# Patient Record
Sex: Male | Born: 1970 | Race: White | Hispanic: No | Marital: Married | Smoking: Current every day smoker
Health system: Southern US, Community
[De-identification: ages and names within clinical notes are randomized; demographics above are authoritative.]

## PROBLEM LIST (undated history)

## (undated) DIAGNOSIS — IMO0002 Reserved for concepts with insufficient information to code with codable children: Secondary | ICD-10-CM

## (undated) DIAGNOSIS — F319 Bipolar disorder, unspecified: Secondary | ICD-10-CM

## (undated) DIAGNOSIS — I10 Essential (primary) hypertension: Secondary | ICD-10-CM

## (undated) DIAGNOSIS — F32A Depression, unspecified: Secondary | ICD-10-CM

## (undated) DIAGNOSIS — F329 Major depressive disorder, single episode, unspecified: Secondary | ICD-10-CM

## (undated) DIAGNOSIS — F419 Anxiety disorder, unspecified: Secondary | ICD-10-CM

## (undated) DIAGNOSIS — K219 Gastro-esophageal reflux disease without esophagitis: Secondary | ICD-10-CM

---
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
## 2012-05-15 ENCOUNTER — Emergency Department (HOSPITAL_COMMUNITY): Payer: Self-pay

## 2012-05-15 ENCOUNTER — Emergency Department (HOSPITAL_COMMUNITY)
Admission: EM | Admit: 2012-05-15 | Discharge: 2012-05-15 | Disposition: A | Discharge status: Home / Self Care | Payer: Self-pay | Attending: Emergency Medicine | Admitting: Emergency Medicine

## 2012-05-15 ENCOUNTER — Encounter (HOSPITAL_COMMUNITY): Payer: Self-pay | Admitting: Emergency Medicine

## 2012-05-15 DIAGNOSIS — F17200 Nicotine dependence, unspecified, uncomplicated: Secondary | ICD-10-CM | POA: Insufficient documentation

## 2012-05-15 DIAGNOSIS — R51 Headache: Secondary | ICD-10-CM | POA: Insufficient documentation

## 2012-05-15 DIAGNOSIS — Z87828 Personal history of other (healed) physical injury and trauma: Secondary | ICD-10-CM | POA: Insufficient documentation

## 2012-05-15 DIAGNOSIS — R6889 Other general symptoms and signs: Secondary | ICD-10-CM

## 2012-05-15 DIAGNOSIS — Z7982 Long term (current) use of aspirin: Secondary | ICD-10-CM | POA: Insufficient documentation

## 2012-05-15 DIAGNOSIS — Z79899 Other long term (current) drug therapy: Secondary | ICD-10-CM | POA: Insufficient documentation

## 2012-05-15 DIAGNOSIS — R05 Cough: Secondary | ICD-10-CM | POA: Insufficient documentation

## 2012-05-15 DIAGNOSIS — Z8739 Personal history of other diseases of the musculoskeletal system and connective tissue: Secondary | ICD-10-CM | POA: Insufficient documentation

## 2012-05-15 DIAGNOSIS — R079 Chest pain, unspecified: Secondary | ICD-10-CM | POA: Insufficient documentation

## 2012-05-15 DIAGNOSIS — R059 Cough, unspecified: Secondary | ICD-10-CM | POA: Insufficient documentation

## 2012-05-15 DIAGNOSIS — R52 Pain, unspecified: Secondary | ICD-10-CM | POA: Insufficient documentation

## 2012-05-15 DIAGNOSIS — R509 Fever, unspecified: Secondary | ICD-10-CM | POA: Insufficient documentation

## 2012-05-15 DIAGNOSIS — R062 Wheezing: Secondary | ICD-10-CM | POA: Insufficient documentation

## 2012-05-15 HISTORY — DX: Anxiety disorder, unspecified: F41.9

## 2012-05-15 MED ORDER — ACETAMINOPHEN 325 MG PO TABS
650.0000 mg | ORAL_TABLET | Freq: Once | ORAL | Status: AC
  Administered 2012-05-15: 650 mg via ORAL
  Filled 2012-05-15: qty 2

## 2012-05-15 MED ORDER — ALBUTEROL SULFATE HFA 108 (90 BASE) MCG/ACT IN AERS
2.0000 | INHALATION_SPRAY | RESPIRATORY_TRACT | Status: DC | PRN
  Administered 2012-05-15: 2 via RESPIRATORY_TRACT
  Filled 2012-05-15: qty 6.7

## 2012-05-15 MED ORDER — OSELTAMIVIR PHOSPHATE 75 MG PO CAPS: 75.0000 mg | ORAL_CAPSULE | Freq: Two times a day (BID) | ORAL | Status: DC

## 2012-05-15 MED ORDER — ALBUTEROL SULFATE HFA 108 (90 BASE) MCG/ACT IN AERS: 2.0000 | INHALATION_SPRAY | RESPIRATORY_TRACT | Status: DC | PRN

## 2012-05-15 MED ORDER — ALBUTEROL SULFATE (5 MG/ML) 0.5% IN NEBU
5.0000 mg | INHALATION_SOLUTION | Freq: Once | RESPIRATORY_TRACT | Status: AC
  Administered 2012-05-15: 5 mg via RESPIRATORY_TRACT
  Filled 2012-05-15: qty 1

## 2012-05-15 NOTE — ED Provider Notes (Signed)
History     CSN: [id number]  Arrival date & time 05/15/12  1540   First MD Initiated Contact with Patient 05/15/12 1654      Chief Complaint  Patient presents with  . Fever  . Cough  . Generalized Body Aches  . Chest Pain  . Headache    (Consider location/radiation/quality/duration/timing/severity/associated sxs/prior treatment) HPI Comments: This is a 41 year old male, who presents emergency department with chief complaint of cough, fever, and flulike symptoms. Patient states that he's been feeling sick for the past 5 days. He has not taken anything to alleviate his symptoms. There are no aggravating or alleviating factors. His symptoms are moderate. He endorses fever, cough, generalized body aches, chest tightness, and headache. He denies nausea, vomiting, diarrhea, constipation, numbness and tingling of the extremities.  The history is provided by the patient. No language interpreter was used.    Past Medical History  Diagnosis Date  . Anxiety     History reviewed. No pertinent past surgical history.  No family history on file.  History  Substance Use Topics  . Smoking status: Current Every Day Smoker  . Smokeless tobacco: Not on file  . Alcohol Use: No      Review of Systems  All other systems reviewed and are negative.    Allergies  Review of patient's allergies indicates no known allergies.  Home Medications   Current Outpatient Rx  Name  Route  Sig  Dispense  Refill  . GABAPENTIN 300 MG PO CAPS   Oral   Take 300 mg by mouth 3 (three) times daily.           BP 141/81  Pulse 82  Temp 100.7 F (38.2 C) (Oral)  Resp 18  SpO2 100%  Physical Exam  Nursing note and vitals reviewed. Constitutional: He is oriented to person, place, and time. He appears well-developed and well-nourished.  HENT:  Head: Normocephalic and atraumatic.  Nose: Nose normal.  Mouth/Throat: Oropharynx is clear and moist. No oropharyngeal exudate.  Eyes: Conjunctivae  normal and EOM are normal. Pupils are equal, round, and reactive to light. Right eye exhibits no discharge. Left eye exhibits no discharge. No scleral icterus.  Neck: Normal range of motion. Neck supple. No JVD present.  Cardiovascular: Normal rate, regular rhythm, normal heart sounds and intact distal pulses.  Exam reveals no gallop and no friction rub.   No murmur heard. Pulmonary/Chest: Effort normal. No respiratory distress. He has wheezes. He has no rales. He exhibits no tenderness.  Abdominal: Soft. Bowel sounds are normal. He exhibits no distension and no mass. There is no tenderness. There is no rebound and no guarding.  Musculoskeletal: Normal range of motion. He exhibits no edema and no tenderness.  Neurological: He is alert and oriented to person, place, and time.       CN 3-12 intact  Skin: Skin is warm and dry.  Psychiatric: He has a normal mood and affect. His behavior is normal. Judgment and thought content normal.    ED Course  Procedures (including critical care time)  No results found for this or any previous visit. Dg Chest 2 View  05/15/2012  *RADIOLOGY REPORT*  Clinical Data: Fever, cough, body aches  CHEST - 2 VIEW  Comparison: None.  Findings: Diffuse mild peribronchial cuffing and central airway thickening without evidence of focal airspace consolidation.  No pleural effusion or pneumothorax. Heart size is upper limits of normal.  No acute osseous abnormality.  IMPRESSION:  1.  Mild central airway  thickening/peribronchial cuffing as can be seen in both bronchitis and asthmatic conditions. 2.  While within normal limits, the cardiac size is at the upper limits of normal.   Original Report Authenticated By: Malachy Moan, M.D.       1. Flu-like symptoms       MDM  41 year old male with flu. Patient was wheezing on initial exam, therefore administered a nebulizer treatment, and the wheezing resolved. Patient states he feels a little but better. I'm going to  discharge the patient with an MDI and will try Tamiflu. Encouraged the patient to rest and take it easy, to hydrate frequently. Patient understands and agrees with the plan. He is stable and ready for discharge.  7:05 PM Patient requesting to be discharged. The fever has improved to 99.5.        Roxy Horseman, PA-C 05/15/12 1906

## 2012-05-15 NOTE — ED Notes (Signed)
Per ems: The patient has had 5 days of flu like symptoms. Possible fever.

## 2012-05-15 NOTE — ED Notes (Signed)
Patient states that for the last week he has had generalized body aches, rib pain and headaches. The patient states that he is also coughing. The patient has low grade temperature

## 2012-05-16 NOTE — ED Provider Notes (Signed)
Medical screening examination/treatment/procedure(s) were performed by non-physician practitioner and as supervising physician I was immediately available for consultation/collaboration.   Gerhard Munch, MD 05/16/12 [phone]

## 2013-11-20 ENCOUNTER — Encounter (HOSPITAL_COMMUNITY): Payer: Self-pay | Admitting: Emergency Medicine

## 2013-11-20 ENCOUNTER — Emergency Department (HOSPITAL_COMMUNITY)
Admission: EM | Admit: 2013-11-20 | Discharge: 2013-11-21 | Disposition: A | Discharge status: Home / Self Care | Payer: Self-pay | Attending: Emergency Medicine | Admitting: Emergency Medicine

## 2013-11-20 DIAGNOSIS — K429 Umbilical hernia without obstruction or gangrene: Secondary | ICD-10-CM | POA: Insufficient documentation

## 2013-11-20 DIAGNOSIS — R1084 Generalized abdominal pain: Secondary | ICD-10-CM | POA: Insufficient documentation

## 2013-11-20 DIAGNOSIS — F17200 Nicotine dependence, unspecified, uncomplicated: Secondary | ICD-10-CM | POA: Insufficient documentation

## 2013-11-20 DIAGNOSIS — R110 Nausea: Secondary | ICD-10-CM | POA: Insufficient documentation

## 2013-11-20 DIAGNOSIS — Z8659 Personal history of other mental and behavioral disorders: Secondary | ICD-10-CM | POA: Insufficient documentation

## 2013-11-20 DIAGNOSIS — Z872 Personal history of diseases of the skin and subcutaneous tissue: Secondary | ICD-10-CM | POA: Insufficient documentation

## 2013-11-20 DIAGNOSIS — Z791 Long term (current) use of non-steroidal anti-inflammatories (NSAID): Secondary | ICD-10-CM | POA: Insufficient documentation

## 2013-11-20 DIAGNOSIS — Z79899 Other long term (current) drug therapy: Secondary | ICD-10-CM | POA: Insufficient documentation

## 2013-11-20 HISTORY — DX: Reserved for concepts with insufficient information to code with codable children: IMO0002

## 2013-11-20 LAB — CBC WITH DIFFERENTIAL/PLATELET
Basophils Absolute: 0 10*3/uL (ref 0.0–0.1)
Basophils Relative: 0 % (ref 0–1)
EOS ABS: 0.2 10*3/uL (ref 0.0–0.7)
EOS PCT: 2 % (ref 0–5)
HEMATOCRIT: 45 % (ref 39.0–52.0)
Hemoglobin: 15.4 g/dL (ref 13.0–17.0)
LYMPHS ABS: 2.7 10*3/uL (ref 0.7–4.0)
Lymphocytes Relative: 23 % (ref 12–46)
MCH: 30.9 pg (ref 26.0–34.0)
MCHC: 34.2 g/dL (ref 30.0–36.0)
MCV: 90.4 fL (ref 78.0–100.0)
MONO ABS: 0.8 10*3/uL (ref 0.1–1.0)
Monocytes Relative: 7 % (ref 3–12)
Neutro Abs: 8.2 10*3/uL — ABNORMAL HIGH (ref 1.7–7.7)
Neutrophils Relative %: 68 % (ref 43–77)
PLATELETS: 233 10*3/uL (ref 150–400)
RBC: 4.98 MIL/uL (ref 4.22–5.81)
RDW: 14.9 % (ref 11.5–15.5)
WBC: 11.9 10*3/uL — ABNORMAL HIGH (ref 4.0–10.5)

## 2013-11-20 LAB — COMPREHENSIVE METABOLIC PANEL
ALBUMIN: 3.9 g/dL (ref 3.5–5.2)
ALT: 25 U/L (ref 0–53)
AST: 28 U/L (ref 0–37)
Alkaline Phosphatase: 62 U/L (ref 39–117)
Anion gap: 15 (ref 5–15)
BILIRUBIN TOTAL: 0.4 mg/dL (ref 0.3–1.2)
BUN: 10 mg/dL (ref 6–23)
CALCIUM: 9.1 mg/dL (ref 8.4–10.5)
CHLORIDE: 101 meq/L (ref 96–112)
CO2: 24 meq/L (ref 19–32)
Creatinine, Ser: 0.9 mg/dL (ref 0.50–1.35)
GFR calc Af Amer: >90 mL/min (ref >90)
Glucose, Bld: 108 mg/dL — ABNORMAL HIGH (ref 70–99)
Potassium: 4.1 mEq/L (ref 3.7–5.3)
SODIUM: 140 meq/L (ref 137–147)
Total Protein: 7.1 g/dL (ref 6.0–8.3)

## 2013-11-20 LAB — LIPASE, BLOOD: LIPASE: 25 U/L (ref 11–59)

## 2013-11-20 MED ORDER — FENTANYL CITRATE 0.05 MG/ML IJ SOLN
50.0000 ug | Freq: Once | INTRAMUSCULAR | Status: AC
Start: 2013-11-20 — End: 2013-11-21
  Administered 2013-11-21: 50 ug via INTRAVENOUS
  Filled 2013-11-20: qty 2

## 2013-11-20 MED ORDER — SODIUM CHLORIDE 0.9 % IV BOLUS (SEPSIS)
1000.0000 mL | Freq: Once | INTRAVENOUS | Status: AC
  Administered 2013-11-21: 1000 mL via INTRAVENOUS

## 2013-11-20 MED ORDER — ONDANSETRON HCL 4 MG/2ML IJ SOLN
4.0000 mg | Freq: Once | INTRAMUSCULAR | Status: AC
  Administered 2013-11-21: 4 mg via INTRAVENOUS
  Filled 2013-11-20: qty 2

## 2013-11-20 NOTE — ED Notes (Signed)
Presents with abdominal pain at the umbilicus began after eating. Pt states, "I felt a knot right at my belly button and it felt like the food was pushing up on something. It was a god awful pain" endorses feeling bloated and nauseated all day. denies SOB. Denies pain in chest. Palpation makes pain worse. Last bowel movement x3 today.

## 2013-11-20 NOTE — ED Notes (Signed)
Dr. Otter at the bedside.  

## 2013-11-21 ENCOUNTER — Emergency Department (HOSPITAL_COMMUNITY): Payer: Self-pay

## 2013-11-21 ENCOUNTER — Encounter (HOSPITAL_COMMUNITY): Payer: Self-pay | Admitting: Radiology

## 2013-11-21 LAB — URINALYSIS, ROUTINE W REFLEX MICROSCOPIC
BILIRUBIN URINE: NEGATIVE
GLUCOSE, UA: NEGATIVE mg/dL
HGB URINE DIPSTICK: NEGATIVE
KETONES UR: NEGATIVE mg/dL
NITRITE: NEGATIVE
PH: 7 (ref 5.0–8.0)
Protein, ur: NEGATIVE mg/dL
SPECIFIC GRAVITY, URINE: 1.01 (ref 1.005–1.030)
Urobilinogen, UA: 1 mg/dL (ref 0.0–1.0)

## 2013-11-21 LAB — URINE MICROSCOPIC-ADD ON

## 2013-11-21 MED ORDER — MORPHINE SULFATE 4 MG/ML IJ SOLN
4.0000 mg | Freq: Once | INTRAMUSCULAR | Status: AC
  Administered 2013-11-21: 4 mg via INTRAVENOUS
  Filled 2013-11-21: qty 1

## 2013-11-21 MED ORDER — DICYCLOMINE HCL 20 MG PO TABS: 20.0000 mg | ORAL_TABLET | Freq: Four times a day (QID) | ORAL | Status: DC | PRN

## 2013-11-21 MED ORDER — OMEPRAZOLE 20 MG PO CPDR: 40.0000 mg | DELAYED_RELEASE_CAPSULE | Freq: Every day | ORAL | Status: DC

## 2013-11-21 MED ORDER — ONDANSETRON 8 MG PO TBDP: 8.0000 mg | ORAL_TABLET | Freq: Three times a day (TID) | ORAL | Status: DC | PRN

## 2013-11-21 MED ORDER — IOHEXOL 300 MG/ML  SOLN
100.0000 mL | Freq: Once | INTRAMUSCULAR | Status: AC | PRN
Start: 2013-11-21 — End: 2013-11-21
  Administered 2013-11-21: 100 mL via INTRAVENOUS

## 2013-11-21 MED ORDER — DICYCLOMINE HCL 10 MG PO CAPS
20.0000 mg | ORAL_CAPSULE | Freq: Once | ORAL | Status: AC
  Administered 2013-11-21: 20 mg via ORAL
  Filled 2013-11-21: qty 2

## 2013-11-21 NOTE — ED Notes (Signed)
Called CT to report patient has had contrast.

## 2013-11-21 NOTE — ED Notes (Signed)
Reported to Dr. Sharol Given that patient reports medication didn't manage stomach pain. MD acknowledges, and orders morphine. She also allows him to drink at this time.

## 2013-11-21 NOTE — ED Notes (Signed)
This tech went in and pt had removed his IV himself, and states he is ready to be discharged.

## 2013-11-21 NOTE — ED Notes (Signed)
CT contrast brought at the bedside for patient to drink.

## 2013-11-21 NOTE — Discharge Instructions (Signed)
Abdominal Pain °Many things can cause abdominal pain. Usually, abdominal pain is not caused by a disease and will improve without treatment. It can often be observed and treated at home. Your health care provider will do a physical exam and possibly order blood tests and X-rays to help determine the seriousness of your pain. However, in many cases, more time must pass before a clear cause of the pain can be found. Before that point, your health care provider may not know if you need more testing or further treatment. °HOME CARE INSTRUCTIONS  °Monitor your abdominal pain for any changes. The following actions may help to alleviate any discomfort you are experiencing: °· Only take over-the-counter or prescription medicines as directed by your health care provider. °· Do not take laxatives unless directed to do so by your health care provider. °· Try a clear liquid diet (broth, tea, or water) as directed by your health care provider. Slowly move to a bland diet as tolerated. °SEEK MEDICAL CARE IF: °· You have unexplained abdominal pain. °· You have abdominal pain associated with nausea or diarrhea. °· You have pain when you urinate or have a bowel movement. °· You experience abdominal pain that wakes you in the night. °· You have abdominal pain that is worsened or improved by eating food. °· You have abdominal pain that is worsened with eating fatty foods. °· You have a fever. °SEEK IMMEDIATE MEDICAL CARE IF:  °· Your pain does not go away within 2 hours. °· You keep throwing up (vomiting). °· Your pain is felt only in portions of the abdomen, such as the right side or the left lower portion of the abdomen. °· You pass bloody or black tarry stools. °MAKE SURE YOU: °· Understand these instructions.   °· Will watch your condition.   °· Will get help right away if you are not doing well or get worse.   °Document Released: 02/10/2005 Document Revised: 05/08/2013 Document Reviewed: 01/10/2013 °[employer]® Patient Information  ©2015 [employer]. This information is not intended to replace advice given to you by your health care provider. Make sure you discuss any questions you have with your health care provider. ° °Nausea, Adult °Nausea is the feeling that you have an upset stomach or have to vomit. Nausea by itself is not likely a serious concern, but it may be an early sign of more serious medical problems. As nausea gets worse, it can lead to vomiting. If vomiting develops, there is the risk of dehydration.  °CAUSES  °· Viral infections. °· Food poisoning. °· Medicines. °· Pregnancy. °· Motion sickness. °· Migraine headaches. °· Emotional distress. °· Severe pain from any source. °· Alcohol intoxication. °HOME CARE INSTRUCTIONS °· Get plenty of rest. °· Ask your caregiver about specific rehydration instructions. °· Eat small amounts of food and sip liquids more often. °· Take all medicines as told by your caregiver. °SEEK MEDICAL CARE IF: °· You have not improved after 2 days, or you get worse. °· You have a headache. °SEEK IMMEDIATE MEDICAL CARE IF:  °· You have a fever. °· You faint. °· You keep vomiting or have blood in your vomit. °· You are extremely weak or dehydrated. °· You have dark or bloody stools. °· You have severe chest or abdominal pain. °MAKE SURE YOU: °· Understand these instructions. °· Will watch your condition. °· Will get help right away if you are not doing well or get worse. °Document Released: 06/10/2004 Document Revised: 01/26/2012 Document Reviewed: 01/13/2011 °[employer]® Patient Information ©2015   [employer]. This information is not intended to replace advice given to you by your health care provider. Make sure you discuss any questions you have with your health care provider. ° °

## 2013-11-21 NOTE — ED Provider Notes (Signed)
CSN: [id number]     Arrival date & time 11/20/13  1853 History   First MD Initiated Contact with Patient 11/20/13 2301     Chief Complaint  Patient presents with  . Abdominal Pain     (Consider location/radiation/quality/duration/timing/severity/associated sxs/prior Treatment) HPI 43 year old male presents to emergency department from home with complaint of diffuse abdominal pain x2 days.  He reports yesterday while eating he felt a knot at his bellybutton.  He had sharp diffuse pain soon afterwards.  Patient reports nausea but no vomiting.  He reports feeling dizzy and like he would pass out on the way here today.  No fevers or chills, no prior history of similar pain.  Patient reports he has history of bleeding ulcers, diagnosed with Hemoccult.  He reports he intermittently takes an unknown acid reducing medication.  He denies previous history of endoscopy or colonoscopy.  Patient reports bloating today. Past Medical History  Diagnosis Date  . Anxiety   . Ulcer    History reviewed. No pertinent past surgical history. History reviewed. No pertinent family history. History  Substance Use Topics  . Smoking status: Current Every Day Smoker    Types: Cigarettes  . Smokeless tobacco: Not on file  . Alcohol Use: No    Review of Systems   See History of Present Illness; otherwise all other systems are reviewed and negative  Allergies  Review of patient's allergies indicates no known allergies.  Home Medications   Prior to Admission medications   Medication Sig Start Date End Date Taking? Authorizing Provider  acetaminophen (TYLENOL) 325 MG tablet Take 325 mg by mouth every 6 (six) hours as needed.   Yes Historical Provider, MD  bismuth subsalicylate (PEPTO BISMOL) 262 MG/15ML suspension Take 30 mLs by mouth every 6 (six) hours as needed.   Yes Historical Provider, MD  ibuprofen (ADVIL,MOTRIN) 200 MG tablet Take 600 mg by mouth every 6 (six) hours as needed.   Yes Historical Provider,  MD  dicyclomine (BENTYL) 20 MG tablet Take 1 tablet (20 mg total) by mouth every 6 (six) hours as needed for spasms (for abdominal cramping). 11/21/13   Kalman Drape, MD  omeprazole (PRILOSEC) 20 MG capsule Take 2 capsules (40 mg total) by mouth daily. 11/21/13   Kalman Drape, MD  ondansetron (ZOFRAN ODT) 8 MG disintegrating tablet Take 1 tablet (8 mg total) by mouth every 8 (eight) hours as needed for nausea or vomiting. 11/21/13   Kalman Drape, MD   BP 109/63  Pulse 82  Temp(Src) 97.8 F (36.6 C) (Oral)  Resp 18  SpO2 98% Physical Exam  Nursing note and vitals reviewed. Constitutional: He is oriented to person, place, and time. He appears well-developed and well-nourished.  HENT:  Head: Normocephalic and atraumatic.  Right Ear: External ear normal.  Left Ear: External ear normal.  Nose: Nose normal.  Mouth/Throat: Oropharynx is clear and moist.  Eyes: Conjunctivae and EOM are normal. Pupils are equal, round, and reactive to light.  Neck: Normal range of motion. Neck supple. No JVD present. No tracheal deviation present. No thyromegaly present.  Cardiovascular: Normal rate, regular rhythm, normal heart sounds and intact distal pulses.  Exam reveals no gallop and no friction rub.   No murmur heard. Pulmonary/Chest: Effort normal and breath sounds normal. No stridor. No respiratory distress. He has no wheezes. He has no rales. He exhibits no tenderness.  Abdominal: Soft. He exhibits mass (small umbilical hernia). He exhibits no distension. There is tenderness (diffuse tenderness to  palpation). There is no rebound and no guarding.  Hyperactive bowel sounds  Musculoskeletal: Normal range of motion. He exhibits no edema and no tenderness.  Lymphadenopathy:    He has no cervical adenopathy.  Neurological: He is alert and oriented to person, place, and time. He has normal reflexes. No cranial nerve deficit. He exhibits normal muscle tone. Coordination normal.  Skin: Skin is warm and dry. No rash  noted. No erythema. No pallor.  Psychiatric: He has a normal mood and affect. His behavior is normal. Judgment and thought content normal.    ED Course  Procedures (including critical care time) Labs Review Labs Reviewed  COMPREHENSIVE METABOLIC PANEL - Abnormal; Notable for the following:    Glucose, Bld 108 (*)    All other components within normal limits  CBC WITH DIFFERENTIAL - Abnormal; Notable for the following:    WBC 11.9 (*)    Neutro Abs 8.2 (*)    All other components within normal limits  URINALYSIS, ROUTINE W REFLEX MICROSCOPIC - Abnormal; Notable for the following:    Leukocytes, UA TRACE (*)    All other components within normal limits  LIPASE, BLOOD  URINE MICROSCOPIC-ADD ON    Imaging Review Ct Abdomen Pelvis W Contrast  11/21/2013   CLINICAL DATA:  Abdominal pain.  Bloating.  Nausea.  EXAM: CT ABDOMEN AND PELVIS WITH CONTRAST  TECHNIQUE: Multidetector CT imaging of the abdomen and pelvis was performed using the standard protocol following bolus administration of intravenous contrast.  CONTRAST:  154mL OMNIPAQUE IOHEXOL 300 MG/ML  SOLN  COMPARISON:  None.  FINDINGS: The liver, gallbladder, pancreas, spleen, adrenal glands, and kidneys are normal in appearance. No evidence hydronephrosis. No soft tissue masses or lymphadenopathy identified within the abdomen or pelvis.  No evidence of inflammatory process or abnormal fluid collections. No evidence of bowel wall thickening or dilatation. Normal appendix is visualized.  IMPRESSION: Negative. No acute findings or other significant abnormality identified.   Electronically Signed   By: Earle Gell M.D.   On: 11/21/2013 01:45     EKG Interpretation   Date/Time:  Tuesday November 20 2013 19:28:01 EDT Ventricular Rate:  89 PR Interval:  148 QRS Duration: 96 QT Interval:  384 QTC Calculation: 467 R Axis:   66 Text Interpretation:  Normal sinus rhythm Normal ECG No old tracing to  compare Confirmed by Thang Flett  MD, Taffany Heiser ([id number]) on  11/20/2013 11:09:41 PM      MDM   Final diagnoses:  Generalized abdominal pain  Nausea    43 year old male with diffuse abdominal pain for 2 days.  He has a very slight elevation in his white blood cell count.  Patient is significantly tender on palpation.  No focality.  History of stomach ulcers, patient does not appear to have acute abdomen at this time, doubt perforated ulcer.  Plan for CT abdomen pelvis given the tenderness on exam.   CT scan unremarkable.  Patient has tolerated contrast without vomiting.  Plan to send home with Zofran Prilosec and Bentyl.    Kalman Drape, MD 11/21/13 [phone]

## 2013-12-19 DIAGNOSIS — R209 Unspecified disturbances of skin sensation: Secondary | ICD-10-CM | POA: Diagnosis present

## 2013-12-19 DIAGNOSIS — R7401 Elevation of levels of liver transaminase levels: Secondary | ICD-10-CM | POA: Diagnosis present

## 2013-12-19 DIAGNOSIS — R740 Nonspecific elevation of levels of transaminase and lactic acid dehydrogenase [LDH]: Secondary | ICD-10-CM

## 2013-12-19 DIAGNOSIS — I10 Essential (primary) hypertension: Secondary | ICD-10-CM | POA: Diagnosis present

## 2013-12-19 DIAGNOSIS — R799 Abnormal finding of blood chemistry, unspecified: Secondary | ICD-10-CM | POA: Diagnosis present

## 2013-12-19 DIAGNOSIS — R0789 Other chest pain: Secondary | ICD-10-CM | POA: Diagnosis present

## 2013-12-19 DIAGNOSIS — E8779 Other fluid overload: Secondary | ICD-10-CM | POA: Diagnosis not present

## 2013-12-19 DIAGNOSIS — J690 Pneumonitis due to inhalation of food and vomit: Secondary | ICD-10-CM | POA: Diagnosis present

## 2013-12-19 DIAGNOSIS — W19XXXA Unspecified fall, initial encounter: Secondary | ICD-10-CM | POA: Diagnosis present

## 2013-12-19 DIAGNOSIS — R21 Rash and other nonspecific skin eruption: Secondary | ICD-10-CM | POA: Diagnosis present

## 2013-12-19 DIAGNOSIS — Z79899 Other long term (current) drug therapy: Secondary | ICD-10-CM

## 2013-12-19 DIAGNOSIS — M6282 Rhabdomyolysis: Principal | ICD-10-CM | POA: Diagnosis present

## 2013-12-19 DIAGNOSIS — R000 Tachycardia, unspecified: Secondary | ICD-10-CM | POA: Diagnosis present

## 2013-12-19 DIAGNOSIS — F17200 Nicotine dependence, unspecified, uncomplicated: Secondary | ICD-10-CM | POA: Diagnosis present

## 2013-12-19 DIAGNOSIS — R52 Pain, unspecified: Secondary | ICD-10-CM | POA: Diagnosis present

## 2013-12-19 DIAGNOSIS — Z9181 History of falling: Secondary | ICD-10-CM

## 2013-12-19 DIAGNOSIS — R29898 Other symptoms and signs involving the musculoskeletal system: Secondary | ICD-10-CM | POA: Diagnosis present

## 2013-12-19 DIAGNOSIS — F1110 Opioid abuse, uncomplicated: Secondary | ICD-10-CM | POA: Diagnosis present

## 2013-12-19 DIAGNOSIS — F1410 Cocaine abuse, uncomplicated: Secondary | ICD-10-CM | POA: Diagnosis present

## 2013-12-19 DIAGNOSIS — M25569 Pain in unspecified knee: Secondary | ICD-10-CM | POA: Diagnosis present

## 2013-12-19 DIAGNOSIS — R7402 Elevation of levels of lactic acid dehydrogenase (LDH): Secondary | ICD-10-CM | POA: Diagnosis present

## 2013-12-20 ENCOUNTER — Inpatient Hospital Stay (HOSPITAL_COMMUNITY)
Admission: EM | Admit: 2013-12-20 | Discharge: 2013-12-25 | DRG: 557 | Disposition: A | Discharge status: Home / Self Care | Payer: Self-pay | Attending: Internal Medicine | Admitting: Internal Medicine

## 2013-12-20 ENCOUNTER — Emergency Department (HOSPITAL_COMMUNITY): Payer: MEDICAID

## 2013-12-20 ENCOUNTER — Emergency Department (HOSPITAL_COMMUNITY): Payer: Self-pay

## 2013-12-20 ENCOUNTER — Inpatient Hospital Stay (HOSPITAL_COMMUNITY): Payer: MEDICAID

## 2013-12-20 ENCOUNTER — Encounter (HOSPITAL_COMMUNITY): Payer: Self-pay | Admitting: Emergency Medicine

## 2013-12-20 ENCOUNTER — Inpatient Hospital Stay (HOSPITAL_COMMUNITY): Payer: Self-pay

## 2013-12-20 DIAGNOSIS — R52 Pain, unspecified: Secondary | ICD-10-CM | POA: Diagnosis present

## 2013-12-20 DIAGNOSIS — J189 Pneumonia, unspecified organism: Secondary | ICD-10-CM

## 2013-12-20 DIAGNOSIS — R29898 Other symptoms and signs involving the musculoskeletal system: Secondary | ICD-10-CM

## 2013-12-20 DIAGNOSIS — R202 Paresthesia of skin: Secondary | ICD-10-CM | POA: Diagnosis present

## 2013-12-20 DIAGNOSIS — F1910 Other psychoactive substance abuse, uncomplicated: Secondary | ICD-10-CM | POA: Diagnosis present

## 2013-12-20 DIAGNOSIS — R0789 Other chest pain: Secondary | ICD-10-CM

## 2013-12-20 DIAGNOSIS — M791 Myalgia, unspecified site: Secondary | ICD-10-CM

## 2013-12-20 DIAGNOSIS — R55 Syncope and collapse: Secondary | ICD-10-CM

## 2013-12-20 DIAGNOSIS — F1410 Cocaine abuse, uncomplicated: Secondary | ICD-10-CM

## 2013-12-20 DIAGNOSIS — T796XXA Traumatic ischemia of muscle, initial encounter: Secondary | ICD-10-CM

## 2013-12-20 DIAGNOSIS — R079 Chest pain, unspecified: Secondary | ICD-10-CM | POA: Diagnosis present

## 2013-12-20 DIAGNOSIS — R740 Nonspecific elevation of levels of transaminase and lactic acid dehydrogenase [LDH]: Secondary | ICD-10-CM

## 2013-12-20 DIAGNOSIS — F1110 Opioid abuse, uncomplicated: Secondary | ICD-10-CM

## 2013-12-20 DIAGNOSIS — R7401 Elevation of levels of liver transaminase levels: Secondary | ICD-10-CM | POA: Diagnosis present

## 2013-12-20 DIAGNOSIS — R778 Other specified abnormalities of plasma proteins: Secondary | ICD-10-CM | POA: Diagnosis present

## 2013-12-20 DIAGNOSIS — R7989 Other specified abnormal findings of blood chemistry: Secondary | ICD-10-CM | POA: Diagnosis present

## 2013-12-20 DIAGNOSIS — M6282 Rhabdomyolysis: Principal | ICD-10-CM | POA: Diagnosis present

## 2013-12-20 LAB — HEPARIN LEVEL (UNFRACTIONATED)
Heparin Unfractionated: <0.1 IU/mL — ABNORMAL LOW (ref 0.30–0.70)
Heparin Unfractionated: 0.19 IU/mL — ABNORMAL LOW (ref 0.30–0.70)

## 2013-12-20 LAB — COMPREHENSIVE METABOLIC PANEL
ALBUMIN: 3 g/dL — AB (ref 3.5–5.2)
ALK PHOS: 89 U/L (ref 39–117)
ALT: 144 U/L — ABNORMAL HIGH (ref 0–53)
ANION GAP: 16 — AB (ref 5–15)
AST: 215 U/L — ABNORMAL HIGH (ref 0–37)
BILIRUBIN TOTAL: 0.6 mg/dL (ref 0.3–1.2)
BUN: 21 mg/dL (ref 6–23)
CHLORIDE: 94 meq/L — AB (ref 96–112)
CO2: 23 mEq/L (ref 19–32)
CREATININE: 1.05 mg/dL (ref 0.50–1.35)
Calcium: 8.7 mg/dL (ref 8.4–10.5)
GFR calc non Af Amer: 87 mL/min — ABNORMAL LOW (ref >90)
GLUCOSE: 108 mg/dL — AB (ref 70–99)
POTASSIUM: 3.6 meq/L — AB (ref 3.7–5.3)
Sodium: 133 mEq/L — ABNORMAL LOW (ref 137–147)
TOTAL PROTEIN: 6.6 g/dL (ref 6.0–8.3)

## 2013-12-20 LAB — URINALYSIS, ROUTINE W REFLEX MICROSCOPIC
Bilirubin Urine: NEGATIVE
GLUCOSE, UA: 100 mg/dL — AB
KETONES UR: NEGATIVE mg/dL
Leukocytes, UA: NEGATIVE
Nitrite: NEGATIVE
PROTEIN: NEGATIVE mg/dL
Specific Gravity, Urine: 1.013 (ref 1.005–1.030)
Urobilinogen, UA: 1 mg/dL (ref 0.0–1.0)
pH: 6.5 (ref 5.0–8.0)

## 2013-12-20 LAB — RAPID URINE DRUG SCREEN, HOSP PERFORMED
AMPHETAMINES: NOT DETECTED
BENZODIAZEPINES: NOT DETECTED
Barbiturates: NOT DETECTED
COCAINE: POSITIVE — AB
OPIATES: POSITIVE — AB
Tetrahydrocannabinol: NOT DETECTED

## 2013-12-20 LAB — ACETAMINOPHEN LEVEL: Acetaminophen (Tylenol), Serum: <15 ug/mL (ref 10–30)

## 2013-12-20 LAB — I-STAT TROPONIN, ED: TROPONIN I, POC: 2.02 ng/mL — AB (ref 0.00–0.08)

## 2013-12-20 LAB — LIPASE, BLOOD: Lipase: 21 U/L (ref 11–59)

## 2013-12-20 LAB — CBC WITH DIFFERENTIAL/PLATELET
BASOS ABS: 0 10*3/uL (ref 0.0–0.1)
Basophils Relative: 0 % (ref 0–1)
Eosinophils Absolute: 0 10*3/uL (ref 0.0–0.7)
Eosinophils Relative: 0 % (ref 0–5)
HEMATOCRIT: 43.7 % (ref 39.0–52.0)
Hemoglobin: 15.2 g/dL (ref 13.0–17.0)
LYMPHS ABS: 1.2 10*3/uL (ref 0.7–4.0)
Lymphocytes Relative: 8 % — ABNORMAL LOW (ref 12–46)
MCH: 31.6 pg (ref 26.0–34.0)
MCHC: 34.8 g/dL (ref 30.0–36.0)
MCV: 90.9 fL (ref 78.0–100.0)
MONO ABS: 1.5 10*3/uL — AB (ref 0.1–1.0)
MONOS PCT: 10 % (ref 3–12)
NEUTROS ABS: 12 10*3/uL — AB (ref 1.7–7.7)
Neutrophils Relative %: 82 % — ABNORMAL HIGH (ref 43–77)
PLATELETS: ADEQUATE 10*3/uL (ref 150–400)
RBC: 4.81 MIL/uL (ref 4.22–5.81)
RDW: 15.9 % — AB (ref 11.5–15.5)
Smear Review: ADEQUATE
WBC: 14.7 10*3/uL — ABNORMAL HIGH (ref 4.0–10.5)

## 2013-12-20 LAB — PROTIME-INR
INR: 1.24 (ref 0.00–1.49)
Prothrombin Time: 15.6 seconds — ABNORMAL HIGH (ref 11.6–15.2)

## 2013-12-20 LAB — CK: CK TOTAL: 7082 U/L — AB (ref 7–232)

## 2013-12-20 LAB — TROPONIN I
TROPONIN I: 2.91 ng/mL — AB (ref <0.30)
TROPONIN I: 1.43 ng/mL — AB (ref <0.30)
Troponin I: 1.53 ng/mL (ref <0.30)

## 2013-12-20 LAB — URINE MICROSCOPIC-ADD ON

## 2013-12-20 LAB — OSMOLALITY: Osmolality: 286 mOsm/kg (ref 275–300)

## 2013-12-20 LAB — MRSA PCR SCREENING: MRSA by PCR: NEGATIVE

## 2013-12-20 LAB — ETHANOL

## 2013-12-20 MED ORDER — HEPARIN (PORCINE) IN NACL 100-0.45 UNIT/ML-% IJ SOLN
1500.0000 [IU]/h | INTRAMUSCULAR | Status: DC
  Administered 2013-12-20: 1500 [IU]/h via INTRAVENOUS
  Filled 2013-12-20: qty 250

## 2013-12-20 MED ORDER — SODIUM CHLORIDE 0.9 % IJ SOLN
3.0000 mL | Freq: Two times a day (BID) | INTRAMUSCULAR | Status: DC
  Administered 2013-12-20 – 2013-12-25 (×6): 3 mL via INTRAVENOUS

## 2013-12-20 MED ORDER — SODIUM CHLORIDE 0.9 % IV BOLUS (SEPSIS)
1000.0000 mL | Freq: Once | INTRAVENOUS | Status: AC
  Administered 2013-12-20: 1000 mL via INTRAVENOUS

## 2013-12-20 MED ORDER — IPRATROPIUM BROMIDE 0.02 % IN SOLN
0.5000 mg | Freq: Four times a day (QID) | RESPIRATORY_TRACT | Status: DC
Start: 2013-12-20 — End: 2013-12-20
  Administered 2013-12-20: 0.5 mg via RESPIRATORY_TRACT
  Filled 2013-12-20: qty 2.5

## 2013-12-20 MED ORDER — NICOTINE 21 MG/24HR TD PT24
21.0000 mg | MEDICATED_PATCH | Freq: Every day | TRANSDERMAL | Status: DC
  Administered 2013-12-20 – 2013-12-25 (×6): 21 mg via TRANSDERMAL
  Filled 2013-12-20 (×6): qty 1

## 2013-12-20 MED ORDER — HEPARIN BOLUS VIA INFUSION
4000.0000 [IU] | Freq: Once | INTRAVENOUS | Status: AC
  Administered 2013-12-20: 4000 [IU] via INTRAVENOUS
  Filled 2013-12-20: qty 4000

## 2013-12-20 MED ORDER — ALUM & MAG HYDROXIDE-SIMETH 200-200-20 MG/5ML PO SUSP
30.0000 mL | Freq: Three times a day (TID) | ORAL | Status: DC | PRN
  Administered 2013-12-20 – 2013-12-21 (×2): 30 mL via ORAL
  Filled 2013-12-20 (×2): qty 30

## 2013-12-20 MED ORDER — HEPARIN BOLUS VIA INFUSION
2500.0000 [IU] | Freq: Once | INTRAVENOUS | Status: AC
  Administered 2013-12-20: 2500 [IU] via INTRAVENOUS
  Filled 2013-12-20: qty 2500

## 2013-12-20 MED ORDER — HEPARIN SODIUM (PORCINE) 5000 UNIT/ML IJ SOLN
5000.0000 [IU] | Freq: Three times a day (TID) | INTRAMUSCULAR | Status: DC
  Filled 2013-12-20 (×4): qty 1

## 2013-12-20 MED ORDER — HEPARIN (PORCINE) IN NACL 100-0.45 UNIT/ML-% IJ SOLN
1200.0000 [IU]/h | INTRAMUSCULAR | Status: DC
  Administered 2013-12-20: 1200 [IU]/h via INTRAVENOUS
  Filled 2013-12-20: qty 250

## 2013-12-20 MED ORDER — SODIUM CHLORIDE 0.9 % IV SOLN
INTRAVENOUS | Status: DC
  Administered 2013-12-20 – 2013-12-21 (×4): via INTRAVENOUS

## 2013-12-20 MED ORDER — IPRATROPIUM BROMIDE 0.02 % IN SOLN
0.5000 mg | Freq: Once | RESPIRATORY_TRACT | Status: AC
  Administered 2013-12-20: 0.5 mg via RESPIRATORY_TRACT
  Filled 2013-12-20: qty 2.5

## 2013-12-20 MED ORDER — AZITHROMYCIN 500 MG IV SOLR
500.0000 mg | Freq: Once | INTRAVENOUS | Status: AC
  Administered 2013-12-20: 500 mg via INTRAVENOUS
  Filled 2013-12-20: qty 500

## 2013-12-20 MED ORDER — METHYLPREDNISOLONE SODIUM SUCC 40 MG IJ SOLR
40.0000 mg | Freq: Two times a day (BID) | INTRAMUSCULAR | Status: DC
  Administered 2013-12-20 – 2013-12-21 (×3): 40 mg via INTRAVENOUS
  Filled 2013-12-20 (×3): qty 1

## 2013-12-20 MED ORDER — DEXTROSE 5 % IV SOLN
1.0000 g | Freq: Once | INTRAVENOUS | Status: AC
  Administered 2013-12-20: 1 g via INTRAVENOUS
  Filled 2013-12-20: qty 10

## 2013-12-20 MED ORDER — ONDANSETRON HCL 4 MG/2ML IJ SOLN
4.0000 mg | Freq: Four times a day (QID) | INTRAMUSCULAR | Status: DC | PRN
  Administered 2013-12-20: 4 mg via INTRAVENOUS
  Filled 2013-12-20: qty 2

## 2013-12-20 MED ORDER — ASPIRIN EC 325 MG PO TBEC
325.0000 mg | DELAYED_RELEASE_TABLET | Freq: Every day | ORAL | Status: DC
  Administered 2013-12-20 – 2013-12-22 (×3): 325 mg via ORAL
  Filled 2013-12-20 (×4): qty 1

## 2013-12-20 MED ORDER — CLINDAMYCIN PHOSPHATE 900 MG/50ML IV SOLN
900.0000 mg | Freq: Once | INTRAVENOUS | Status: AC
  Administered 2013-12-20: 900 mg via INTRAVENOUS
  Filled 2013-12-20: qty 50

## 2013-12-20 MED ORDER — SODIUM CHLORIDE 0.9 % IV SOLN
3.0000 g | Freq: Four times a day (QID) | INTRAVENOUS | Status: DC
  Administered 2013-12-20 – 2013-12-25 (×19): 3 g via INTRAVENOUS
  Filled 2013-12-20 (×24): qty 3

## 2013-12-20 MED ORDER — HEPARIN (PORCINE) IN NACL 100-0.45 UNIT/ML-% IJ SOLN
1750.0000 [IU]/h | INTRAMUSCULAR | Status: DC
  Administered 2013-12-20: 1750 [IU]/h via INTRAVENOUS
  Filled 2013-12-20 (×2): qty 250

## 2013-12-20 MED ORDER — ALBUTEROL SULFATE (2.5 MG/3ML) 0.083% IN NEBU
2.5000 mg | INHALATION_SOLUTION | RESPIRATORY_TRACT | Status: DC | PRN
  Administered 2013-12-22: 2.5 mg via RESPIRATORY_TRACT
  Filled 2013-12-20: qty 3

## 2013-12-20 MED ORDER — IPRATROPIUM-ALBUTEROL 0.5-2.5 (3) MG/3ML IN SOLN
3.0000 mL | Freq: Three times a day (TID) | RESPIRATORY_TRACT | Status: DC
  Administered 2013-12-21 (×2): 3 mL via RESPIRATORY_TRACT
  Filled 2013-12-20 (×4): qty 3

## 2013-12-20 MED ORDER — PANTOPRAZOLE SODIUM 40 MG PO TBEC
40.0000 mg | DELAYED_RELEASE_TABLET | Freq: Every day | ORAL | Status: DC
  Administered 2013-12-20 – 2013-12-25 (×5): 40 mg via ORAL
  Filled 2013-12-20 (×7): qty 1

## 2013-12-20 MED ORDER — IPRATROPIUM-ALBUTEROL 0.5-2.5 (3) MG/3ML IN SOLN
3.0000 mL | Freq: Four times a day (QID) | RESPIRATORY_TRACT | Status: DC
  Administered 2013-12-20: 3 mL via RESPIRATORY_TRACT
  Filled 2013-12-20: qty 3

## 2013-12-20 MED ORDER — ALBUTEROL SULFATE (2.5 MG/3ML) 0.083% IN NEBU
2.5000 mg | INHALATION_SOLUTION | Freq: Four times a day (QID) | RESPIRATORY_TRACT | Status: DC
  Administered 2013-12-20: 2.5 mg via RESPIRATORY_TRACT
  Filled 2013-12-20: qty 3

## 2013-12-20 MED ORDER — OXYCODONE HCL 5 MG PO TABS
5.0000 mg | ORAL_TABLET | ORAL | Status: DC | PRN
  Administered 2013-12-20 – 2013-12-25 (×18): 5 mg via ORAL
  Filled 2013-12-20 (×19): qty 1

## 2013-12-20 MED ORDER — ONDANSETRON HCL 4 MG PO TABS: 4.0000 mg | ORAL_TABLET | Freq: Four times a day (QID) | ORAL | Status: DC | PRN

## 2013-12-20 MED ORDER — ALBUTEROL SULFATE (2.5 MG/3ML) 0.083% IN NEBU
5.0000 mg | INHALATION_SOLUTION | Freq: Once | RESPIRATORY_TRACT | Status: AC
  Administered 2013-12-20: 5 mg via RESPIRATORY_TRACT
  Filled 2013-12-20: qty 6

## 2013-12-20 NOTE — H&P (Signed)
[facility] History and Physical  Patient: Ralph Chavez  CWC:[id number]  DOB: 1970-09-17  DOS: the patient was seen and examined on 12/20/2013 PCP: No PCP Per Patient  Chief Complaint: Generalized body aches  HPI: Ralph Chavez is a 43 y.o. male with Past medical history of polysubstance abuse. The patient presented with complaints of a fall and generalized body pain. He is significantly poor historian and is not providing much information on history and talking with verbal abuse. He mentions that he used heroin for the first time on Monday and may have a fall. He does not remember how the fall happened he does not remember any information related to fall. Tuesday sometime he was found by a friend lying in his bed unresponsive. He has weakness in both legs throughout the Tuesday and Wednesday with numbness and tingling along with weakness. He also has some numbness on his left hand at the ulnar border. He could not walk or place any weight on his legs since Tuesday and has been lying down in the bed. Due to worsening pain and weakness EMS was called and the patient was brought here. He denies any other drug abuse but on questioning about also do cocaine in his urine he mentions he has used it once long time ago. He denies abusing any other opioids. He denies any prior use of opioids. He mentions he used heroine via snorting. He denies any dizziness or lightheadedness. He denies any headache. He feels more no fever no chills. He complains of some pain on his chest more on the right side. Worsening with deep breathing. He complains of pain in his abdomen, on his right side of the chest, bilateral flank, which is sharp pain all over. He also complains of pain in bilateral thigh, worsening with flexion, bilateral legs, and foot. He never had any rash before.  The patient is coming from home. And at his baseline independent for most of his ADL.  Review of Systems: as mentioned in the history  of present illness.  A Comprehensive review of the other systems is negative.  Past Medical History  Diagnosis Date  . Anxiety   . Ulcer    No past surgical history on file. Social History:  reports that he has been smoking Cigarettes.  He has been smoking about 0.00 packs per day. He has never used smokeless tobacco. He reports that he drinks alcohol. He reports that he uses illicit drugs (Cocaine).  No Known Allergies  No family history on file.  Prior to Admission medications   Medication Sig Start Date End Date Taking? Authorizing Provider  omeprazole (PRILOSEC) 20 MG capsule Take 20 mg by mouth daily.   Yes Historical Provider, MD    Physical Exam: Filed Vitals:   12/20/13 0001 12/20/13 0133 12/20/13 0137 12/20/13 0240  BP: 165/92 163/87  164/107  Pulse: 96 94  96  Temp: 98.9 F (37.2 C)  98.6 F (37 C)   TempSrc: Oral  Oral   Resp: 18 25  31   Height: 5\' 10"  (1.778 m)     Weight: 86.183 kg (190 lb)     SpO2: 98% 96%  99%    General: Alert, Awake and Oriented to Time, Place and Person. Appear in mild distress, verbally abusive Eyes: PERRL ENT: Oral Mucosa clear moist. Neck: no JVD Cardiovascular: S1 and S2 Present, no Murmur, Peripheral Pulses Present Respiratory: Bilateral Air entry equal and Decreased, right-sided Crackles, bilateral expiratory wheezes Abdomen: Bowel Sound Present, Soft and Non  tender on exam Skin: Bilateral thigh has diffuse lacelike Rash Extremities: No Pedal edema, generalized leg tenderness Neurologic: Mental status alert awake and oriented anxious speech normal, Cranial Nerves pupils are reactive cough reflex present, Motor strength bilaterally equal strength with 3 x 5 strength in upper extremity likely due to poor effort and 4 x 5 in lower extremity, Sensation bilaterally equal sensation, reflexes present on left difficult to assess on right, babinski equivocal, Cerebellar test normal.  Labs on Admission:  CBC:  Recent Labs Lab  12/20/13 0119  WBC 14.7*  NEUTROABS 12.0*  HGB 15.2  HCT 43.7  MCV 90.9  PLT PLATELET CLUMPS NOTED ON SMEAR, COUNT APPEARS ADEQUATE    CMP     Component Value Date/Time   NA 133* 12/20/2013 0119   K 3.6* 12/20/2013 0119   CL 94* 12/20/2013 0119   CO2 23 12/20/2013 0119   GLUCOSE 108* 12/20/2013 0119   BUN 21 12/20/2013 0119   CREATININE 1.05 12/20/2013 0119   CALCIUM 8.7 12/20/2013 0119   PROT 6.6 12/20/2013 0119   ALBUMIN 3.0* 12/20/2013 0119   AST 215* 12/20/2013 0119   ALT 144* 12/20/2013 0119   ALKPHOS 89 12/20/2013 0119   BILITOT 0.6 12/20/2013 0119   GFRNONAA 87* 12/20/2013 0119   GFRAA >90 12/20/2013 0119     Recent Labs Lab 12/20/13 0119  LIPASE 21   No results found for this basename: AMMONIA,  in the last 168 hours   Recent Labs Lab 12/20/13 0119  CKTOTAL 7082*   BNP (last 3 results) No results found for this basename: PROBNP,  in the last 8760 hours  Radiological Exams on Admission: Ct Cervical Spine Wo Contrast  12/20/2013   CLINICAL DATA:  Fall with weakness and pain.  EXAM: CT HEAD WITHOUT CONTRAST  CT CERVICAL SPINE WITHOUT CONTRAST  TECHNIQUE: Multidetector CT imaging of the head and cervical spine was performed following the standard protocol without intravenous contrast. Multiplanar CT image reconstructions of the cervical spine were also generated.  COMPARISON:  None.  FINDINGS: CT HEAD FINDINGS  Skull and Sinuses:Inflammatory mucosal thickening in the paranasal sinuses. No sinus effusion. 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 There is diffuse edema of the scalp. No calvarial fracture.  Orbits: No acute abnormality.  Brain: No evidence of acute abnormality, such as acute infarction, hemorrhage, hydrocephalus, or mass lesion/mass effect.  CT CERVICAL SPINE FINDINGS  Negative for acute fracture or traumatic subluxation. No prevertebral edema. No gross cervical canal hematoma. Facet degeneration, most notable at C3-4 where there is left foraminal stenosis secondary to  spurring.  IMPRESSION: No evidence of intracranial abnormality or cervical spine injury.   Electronically Signed   By: Jorje Guild M.D.   On: 12/20/2013 02:28   Dg Knee Complete 4 Views Left  12/20/2013   CLINICAL DATA:  Fall knee pain  EXAM: LEFT KNEE - COMPLETE 4+ VIEW  COMPARISON:  None  FINDINGS: Negative for fracture. Normal alignment. Anterior soft tissue swelling. No joint effusion. Joint spaces are maintained.  IMPRESSION: Anterior soft tissue swelling. Negative for fracture or dislocation.   Electronically Signed   By: Franchot Gallo M.D.   On: 12/20/2013 02:03    Assessment/Plan Principal Problem:   Rhabdomyolysis Active Problems:   Paresthesia of both lower extremities with weakness   Body aches   Chest pain   Transaminitis   Polysubstance abuse   1. Rhabdomyolysis The patient is presently complains of bilateral pain, paresthesia, rash, generalized weakness. He is found to  have elevated CK and LFT with AST to ALT ratio nearly 2 is to 1 with leukocytosis and platelet clumps with chest x-ray showing right-sided pneumonia and no fracture on CT scan of spine no acute intracranial abnormality on CT head. He is CT lumbar spine shows iliopsoas swelling. He complains of numbness but his exam is positive for sensations on light touch bilaterally. He has bilateral pulses and soft muscle compartments. He has a lacelike rash on bilateral thigh but no other redness or swelling of the leg. With all this findings the patient will be admitted to the hospital for possible rhabdomyolysis secondary to traumatic immobilization. We will hydrate him with IV fluids aggressively. Monitor neurovascular checks on lower extremity to keep in mind for compartment syndrome. Patient will currently be admitted to step down unit for close monitoring due to his neuromuscular complaining. Admit up an MRI of his lumbar spine and thoracic spine for further workup including ruling out any traumatic cord injury  versus inflammation. Based on the MRI neurology will be consulted. Currently with confluent findings and soft compartments with pulses palpable bilaterally I would hold off on consulting surgery at present. Probable etiology of rhabdomyolysis also into heroin induced hepatic toxicity.  2. This transaminitis. Patient denies any alcohol abuse. We will check Tylenol and ethanol levels. Continue to monitor at present. If there is further worsening he may require right upper quadrant ultrasound.  3. Pneumonia Patient is found to have right-sided pneumonia likely aspiration pneumonia. Currently I will give her lidocaine patch as well as local pain. I would treat him with you labs as well as Solu-Medrol 40 mg due to his wheezing and history of smoking. Treating him with Unasyn.  4. polysubstance abuse. Patient complains of significant pain. He is tachycardic and hypertensive therefore I would treat him with low dose of oxycodone.  DVT Prophylaxis: subcutaneous Heparin Nutrition:  Regular diet with aspiration precaution  Code Status:  Full  Disposition: Admitted to inpatient in step-down unit.  Author: Berle Mull, MD [facility] Pager: [phone] 12/20/2013, 5:38 AM    If 7PM-7AM, please contact night-coverage [email or web address] Password [email or web address]  **Disclaimer: This note may have been dictated with voice recognition software. Similar sounding words can inadvertently be transcribed and this note may contain transcription errors which may not have been corrected upon publication of note.**

## 2013-12-20 NOTE — ED Notes (Signed)
Patient has weakness in both legs after doing heroin on Monday. Patient states that he started having numbness, tingling, and falling five hours after trying heroin for the first time. The weakness in the legs was before he fell. EMS tried to walk patient but he could not stand.

## 2013-12-20 NOTE — Progress Notes (Signed)
Patient seen and examined, and data base reviewed. Patient seen with significant other at bedside, uses profanity and foul language. Patient seen earlier today by my colleague Dr. Posey Pronto. Patient initially admitted to the hospital after a fall and rhabdomyolysis, has pain all over.  Has recent use of heroin and cocaine. His troponin is 2.9, cardiology consulted, avoid beta blockers in the light of recent cocaine use, patient is on heparin. It appears that he has right lower lobe pneumonia, likely from aspiration as he lost his consciousness.  Birdie Hopes Pager: [phone] 12/20/2013, 2:53 PM

## 2013-12-20 NOTE — ED Notes (Signed)
Patient was vomiting yesterday and has had only two pieces of bacon today.

## 2013-12-20 NOTE — ED Notes (Signed)
Patient has been requested to keep blood pressure cuff, oxygen, and other equipment. Patient doesn't want the door open. Explained to patient the importance of staff keeping a close eye on him. Patient still wanted the door closed. Charge nurse accompanied nurse to explain to patient. Hospitalist went in and spoke to patient after the nurse and explained everything.

## 2013-12-20 NOTE — Progress Notes (Signed)
ANTICOAGULATION CONSULT NOTE - Follow Up Consult  Pharmacy Consult for Heparin Indication: chest pain/ACS  No Known Allergies  Patient Measurements: Height: 5\' 10"  (177.8 cm) Weight: 190 lb (86.183 kg) IBW/kg (Calculated) : 73 Heparin Dosing Weight: 86 kg  Vital Signs: BP: 138/90 mmHg (08/06 1400) Pulse Rate: 85 (08/06 1400)  Labs:  Recent Labs  12/20/13 0119 12/20/13 0755 12/20/13 0828 12/20/13 1450  HGB 15.2  --   --   --   HCT 43.7  --   --   --   PLT PLATELET CLUMPS NOTED ON SMEAR, COUNT APPEARS ADEQUATE  --   --   --   LABPROT  --   --  15.6*  --   INR  --   --  1.24  --   HEPARINUNFRC  --   --   --  <0.10*  CREATININE 1.05  --   --   --   CKTOTAL 7082*  --   --   --   TROPONINI  --  2.91*  --  1.53*    Estimated Creatinine Clearance: 96.6 ml/min (by C-G formula based on Cr of 1.05).   Medications:  Infusions:  . sodium chloride 150 mL/hr at 12/20/13 0801  . heparin 1,200 Units/hr (12/20/13 0802)    Assessment: 61 yoM admitted 8/6 with generalized body aches and weakness. Known polysubstance abuse, UDS positive for opioids and cocaine.  Troponin elevated in setting of likely rhabdomyolysis (CK elevated).  Pharmacy consulted to dose IV heparin for possible ACS with complaints of abd, R-sided chest pain, and flank pain.    First Heparin Level < 0.1, subtherapeutic on heparin at 1200 units/hr.  RN reports heparin drip was off for ~ 30 minutes this morning, around 0800 d/t IV pulled out.  No other interruptions, infusion complications, or bleeding reported.  Goal of Therapy:  Heparin level 0.3-0.7 units/ml Monitor platelets by anticoagulation protocol: Yes   Plan:   Give heparin 2500 units bolus IV x 1  Increase to heparin IV infusion at 1500 units/hr (15 ml/hr)  Heparin level 6 hours after starting  Daily heparin level and CBC  Continue to monitor H&H and platelets  Gretta Arab PharmD, BCPS Pager [phone] 12/20/2013 3:52 PM

## 2013-12-20 NOTE — Progress Notes (Signed)
ANTIBIOTIC CONSULT NOTE - INITIAL  Pharmacy Consult for Unasyn Indication: pneumonia  No Known Allergies  Patient Measurements: Height: 5\' 10"  (177.8 cm) Weight: 190 lb (86.183 kg) IBW/kg (Calculated) : 73 Adjusted Body Weight:   Vital Signs: Temp: 98.6 F (37 C) (08/06 0137) Temp src: Oral (08/06 0137) BP: 162/84 mmHg (08/06 0605) Pulse Rate: 96 (08/06 0240) Intake/Output from previous day: 08/05 0701 - 08/06 0700 In: 1450 [IV Piggyback:1450] Out: -  Intake/Output from this shift: Total I/O In: 1450 [IV Piggyback:1450] Out: -   Labs:  Recent Labs  12/20/13 0119  WBC 14.7*  HGB 15.2  PLT PLATELET CLUMPS NOTED ON SMEAR, COUNT APPEARS ADEQUATE  CREATININE 1.05   Estimated Creatinine Clearance: 96.6 ml/min (by C-G formula based on Cr of 1.05). No results found for this basename: VANCOTROUGH, VANCOPEAK, VANCORANDOM, GENTTROUGH, GENTPEAK, GENTRANDOM, TOBRATROUGH, TOBRAPEAK, TOBRARND, AMIKACINPEAK, AMIKACINTROU, AMIKACIN,  in the last 72 hours   Microbiology: No results found for this or any previous visit (from the past 720 hour(s)).  Medical History: Past Medical History  Diagnosis Date  . Anxiety   . Ulcer     Medications:  Anti-infectives   Start     Dose/Rate Route Frequency Ordered Stop   12/20/13 0630  Ampicillin-Sulbactam (UNASYN) 3 g in sodium chloride 0.9 % 100 mL IVPB     3 g 100 mL/hr over 60 Minutes Intravenous 4 times per day 12/20/13 0622     12/20/13 0415  clindamycin (CLEOCIN) IVPB 900 mg     900 mg 100 mL/hr over 30 Minutes Intravenous  Once 12/20/13 0401 12/20/13 0455   12/20/13 0400  cefTRIAXone (ROCEPHIN) 1 g in dextrose 5 % 50 mL IVPB     1 g 100 mL/hr over 30 Minutes Intravenous  Once 12/20/13 0353 12/20/13 0455   12/20/13 0400  azithromycin (ZITHROMAX) 500 mg in dextrose 5 % 250 mL IVPB     500 mg 250 mL/hr over 60 Minutes Intravenous  Once 12/20/13 0353 12/20/13 0546     Assessment: Patient with Aspiration Pneumonia and unasyn  per pharmacy ordered.  Goal of Therapy:  Unasyn dosed based on patient weight and renal function   Plan:  Follow up culture results Unasyn 3gm iv q6hr  Tyler Deis, Shea Stakes [place] 12/20/2013,6:24 AM

## 2013-12-20 NOTE — ED Notes (Signed)
Patient went to MRI. Dr. Posey Pronto gave a verbal that patient can go to MRI without tele. Step down ICU is picking patient up at MRI (Ralph Chavez)

## 2013-12-20 NOTE — ED Provider Notes (Signed)
CSN: [id number]     Arrival date & time 12/19/13  2357 History   First MD Initiated Contact with Patient 12/20/13 0004     Chief Complaint  Patient presents with  . Weakness    Patient has weakness in both legs after doing heroin on Monday. Patient states that he started having numbness, tingling, and falling five hours after trying heroin for the first time. The weakness in the legs was before he fell.     (Consider location/radiation/quality/duration/timing/severity/associated sxs/prior Treatment) HPI Comments: Jmari Pelc is a 43 y.o. Male with a PMHx of anxiety and gastric ulcer presenting today with BLE numbness/weakness and "tightness" x2 days after trying heroin for the first time Monday. States 5 hrs after snorting and injecting $100 worth of heroin, he developed these symptoms, and subsequently fell but does not recall the details of his fall. Pain is constant, nonradiating, severe, worse with movement/standing, and with no known alleviating factors. Has not tried anything specifically for this. States he has not been able to walk since Monday, has been using a bedside urinal and has had a family member bring him food/water. States his mid-back in the thoracic area started hurting around the same time but is unsure if this was before or after the fall because he cannot recall the fall, he only knows he fell because he had a scrape on his L knee and a swollen area to his scalp. Endorses epigastric abd pain, intermittent SOB and cough with blackish sputum since Monday. Denies fevers, chills, HA, vision changes, URI symptoms, CP, DOE, diaphoresis, jaw pain, neck pain/stiffness, LE swelling, calf swelling/erythema, joint swelling, incontinence of urine/stool, melena, hematochezia, n/v/d/c, scrotal swelling or pain, penile discharge, recent immobilization or travel, or hx of DVT. Drinks 5 beers per day, denies any other illicit drug use including marijuana or cocaine, smokes 1.5 ppd.   Patient is  a 43 y.o. male presenting with weakness. The history is provided by the patient. No language interpreter was used.  Weakness This is a new problem. The current episode started in the past 7 days. The problem occurs constantly. The problem has been unchanged. Associated symptoms include abdominal pain (epigastric), coughing (chronic), joint swelling (L knee), myalgias (BLEs), numbness (BLEs) and weakness (BLEs). Pertinent negatives include no change in bowel habit, chest pain, chills, congestion, diaphoresis, fatigue, fever, headaches, nausea, neck pain, rash, sore throat, urinary symptoms, vertigo, visual change or vomiting. The symptoms are aggravated by walking and standing. He has tried nothing for the symptoms. The treatment provided no relief.    Past Medical History  Diagnosis Date  . Anxiety   . Ulcer    No past surgical history on file. No family history on file. History  Substance Use Topics  . Smoking status: Current Every Day Smoker    Types: Cigarettes  . Smokeless tobacco: Never Used  . Alcohol Use: Yes    Review of Systems  Constitutional: Negative for fever, chills, diaphoresis and fatigue.  HENT: Negative for congestion and sore throat.        +swelling to occiput  Eyes: Negative for visual disturbance.  Respiratory: Positive for cough (chronic) and shortness of breath (intermittent, not currently). Negative for chest tightness.   Cardiovascular: Negative for chest pain, palpitations and leg swelling.  Gastrointestinal: Positive for abdominal pain (epigastric). Negative for nausea, vomiting, diarrhea, constipation, blood in stool, anal bleeding, rectal pain and change in bowel habit.  Genitourinary: Negative for dysuria, urgency, hematuria, flank pain, discharge, penile swelling, scrotal swelling, difficulty  urinating, penile pain and testicular pain.  Musculoskeletal: Positive for back pain (mid thoracic), gait problem (unable to walk), joint swelling (L knee) and  myalgias (BLEs). Negative for neck pain and neck stiffness.  Skin: Positive for wound (abrasion L knee, contusion scalp). Negative for rash.  Neurological: Positive for weakness (BLEs) and numbness (BLEs). Negative for dizziness, vertigo, syncope, light-headedness and headaches.  Psychiatric/Behavioral: Negative for confusion.  10 Systems reviewed and are negative for acute change except as noted in the HPI.     Allergies  Review of patient's allergies indicates no known allergies.  Home Medications   Prior to Admission medications   Medication Sig Start Date End Date Taking? Authorizing Provider  omeprazole (PRILOSEC) 20 MG capsule Take 20 mg by mouth daily.   Yes Historical Provider, MD   BP 163/87  Pulse 94  Temp(Src) 98.6 F (37 C) (Oral)  Resp 25  Ht 5\' 10"  (1.778 m)  Wt 190 lb (86.183 kg)  BMI 27.26 kg/m2  SpO2 96% Physical Exam  Nursing note and vitals reviewed. Constitutional: He is oriented to person, place, and time. Vital signs are normal. He appears well-developed and well-nourished. No distress.  VSS, NAD  HENT:  Head: Head is with contusion. Head is without Battle's sign and without abrasion.    Nose: Nose normal.  Mouth/Throat: Uvula is midline, oropharynx is clear and moist and mucous membranes are normal.  Scalp contusion with swelling noted to parietal/occipital area with no abrasion or laceration. No battle's sign  Eyes: Conjunctivae and EOM are normal. Pupils are equal, round, and reactive to light. Right eye exhibits no discharge. Left eye exhibits no discharge.  Neck: Normal range of motion. Neck supple. No spinous process tenderness and no muscular tenderness present. No rigidity. Normal range of motion present.  FROM intact with no spinous process or paraspinous muscle TTP. No rigidity or meningeal signs.  Cardiovascular: Normal rate, regular rhythm, normal heart sounds and intact distal pulses.  Exam reveals no gallop and no friction rub.   No murmur  heard. Pulmonary/Chest: Effort normal. No respiratory distress. He has no decreased breath sounds. He has wheezes. He has no rhonchi. He has no rales. Chest wall is not dull to percussion. He exhibits no tenderness, no bony tenderness and no crepitus.  Diffuse expiratory wheezes throughout all lung fields, which pt states is due to his smoking. No decreased breath sounds or rhonchi/rales. Chest nonTTP without crepitus or deformity or subQ air  Abdominal: Soft. Normal appearance and bowel sounds are normal. He exhibits no distension. There is tenderness in the epigastric area. There is no rigidity, no rebound, no guarding, no CVA tenderness, no tenderness at McBurney's point and negative Murphy's sign.    +BS throughout, soft nondistended with TTP in epigastrum but no r/g/r, neg murphy's, neg mcburney's, neg CVA tenderness. No midline pulsatile mass although body habitus limits exam  Musculoskeletal:       Right knee: He exhibits decreased range of motion. No tenderness found.       Left knee: He exhibits deformity, erythema and abnormal patellar mobility. He exhibits normal range of motion, no swelling, no effusion, no ecchymosis, no LCL laxity, no bony tenderness and no MCL laxity. No tenderness found.       Cervical back: Normal.       Thoracic back: Normal.       Lumbar back: Normal.       Right upper leg: He exhibits tenderness.       Left upper  leg: He exhibits tenderness.       Right lower leg: He exhibits tenderness.       Left lower leg: He exhibits tenderness.  L knee with abrasion and mild erythema but no warmth to joint. Patella slightly high-riding with sunken area over patellar tendon, FROM intact but patella mobility limited and does not appear to follow typical linear movement. No effusion or varus/valgus laxity. Pt states it's nonTTP but states he can't feel his legs well enough to answer this question appropriately.  Sensation to touch diminished in BLEs, R>L, but able to feel  pinching. Pt able to dorsiflex/plantarflex with ease and strength 5/5 with these movements, but is unable to flex/extend R knee without assistance. Neg SLR bilaterally.  All spinal levels with no spinous process or paraspinous muscle TTP. No deformity or bony step offs. ROM unable to be fully assessed given that pt refuses to stand. DTRs unable to be assessed due to pt uncooperativeness with exam. Upper extremities with good sensation throughout, and strength 5/5.  Neurological: He is alert and oriented to person, place, and time. A sensory deficit is present. Gait abnormal.  Sensation and strength and DTRs as above. Refuses to walk.  Skin: Skin is warm and dry. Abrasion noted. No rash noted.     Deformity to L knee as above. Abrasion to scalp as above. No other abnormalities noted to skin over exposed surfaces, no pedal edema.   Psychiatric: His affect is angry. He is agitated and aggressive. Cognition and memory are impaired.  Very upset with exam, refusing to answer questions and initially very disrespectful. Does not recall all events.     ED Course  Procedures (including critical care time) Labs Review Labs Reviewed  URINE RAPID DRUG SCREEN (HOSP PERFORMED) - Abnormal; Notable for the following:    Opiates POSITIVE (*)    Cocaine POSITIVE (*)    All other components within normal limits  CBC WITH DIFFERENTIAL  COMPREHENSIVE METABOLIC PANEL  CK  LIPASE, BLOOD  URINALYSIS, ROUTINE W REFLEX MICROSCOPIC    Imaging Review No results found.   EKG Interpretation None      MDM   Final diagnoses:  None    Vague complaints of BLE weakness/numbness following heroin use on Monday. Exam revealing L knee deformity, possible patellar tendon rupture, will xray. Epigastric pain and vague midthoracic pain could represent pancreatitis, pt is alcoholic, therefore will obtain basic labs including cbc w/diff, cmp, lipase, CK, UDS, and U/A. DDx includes rhabdo, spinal cord injury, conversion  disorder or other psych issue, Gillian-Barre syndrome. Doubt DVT/dissection at this time. Doubt ALS at this time, although this does present with ascending paralysis. Discussed pt with Dr. Ashok Cordia who agrees to take over care. Please see his dictation for further documentation of his care.     [place], [place] 12/20/13 [phone]

## 2013-12-20 NOTE — ED Notes (Signed)
Patient was complaining of his back was hurting and he could not breathe. MD was called to bedside.

## 2013-12-20 NOTE — Progress Notes (Signed)
Pt came down to MRI for MR of the thoracic and lumbar spine. Pt completed most of the thoracic spine. Before completion pt refused any further imaging. Pt stated he would not continue. Pt being admitted. Thoracic spine was completed out for dictation with images that were able to be obtained. Unable to complete mri of the lumbar spine due to pt/s refusal.

## 2013-12-20 NOTE — Progress Notes (Signed)
CARE MANAGEMENT NOTE 12/20/2013  Patient:  Ralph Chavez, Ralph Chavez   Account Number:  [id number]  Date Initiated:  12/20/2013  Documentation initiated by:  DAVIS,RHONDA  Subjective/Objective Assessment:   pt with multiple health problems -rhabo/pna/polysubstance abuse/main complaint that time of admission is generalized weakness and pain/     Action/Plan:   home when stable   Anticipated DC Date:  12/23/2013   Anticipated DC Plan:  [place] referral  Clinical Social Worker  Development worker, community      DC Planning Services  NA      [facility] Choice  NA   Choice offered to / List presented to:  NA   DME arranged  NA      DME agency  NA     [place] arranged  NA      [place] agency  NA   Status of service:  In process, will continue to follow Medicare Important Message given?  NA - LOS <3 / Initial given by admissions (If response is "NO", the following Medicare IM given date fields will be blank) Date Medicare IM given:   Medicare IM given by:   Date Additional Medicare IM given:   Additional Medicare IM given by:    Discharge Disposition:    Per UR Regulation:  Reviewed for med. necessity/level of care/duration of stay  If discussed at [facility] of Stay Meetings, dates discussed:    Comments:  [id number] Velva Harman, RN, BSN, CCM:  [phone] Chart reviewed.  No discharge needs present at time of this review. Financial counselor to see due to no active insurance.  May need scw due to polysubstance abuse. Next review of chart due on [id number].

## 2013-12-20 NOTE — ED Notes (Signed)
Pt refused continuous blood pressure reading.

## 2013-12-20 NOTE — ED Provider Notes (Signed)
Medical screening examination/treatment/procedure(s) were conducted as a shared visit with non-physician practitioner(s) and myself.  I personally evaluated the patient during the encounter.  Pt c/o use of heroin 2 days ago, then fell.  Pt very difficult/poor historian with little/no specifics provided regarding the abuse, the fall, how long on ground, in what position, etc.  Pt also very poor/belligerent historian, esp as relates localizing his pain and symptoms.   On exam diffuse neck and back tenderness, no focal bony tenderness, no step off.   ctls cts read as showing no acute bony spine injury. Note made on ct t spine and cxr of right lung airspace disease, ?aspiration or edema as sequela to heroine abuse episode vs pneumonia.  Pt does note sob, non prod cough, r rhonchi and wheezing bil, elev wbc.  Will rx possible cap. Will add clinda in event asp pna. Albuterol and atrovent neb.  Ck elevated, small hgb/blood on urine dip, renal fxn normal - ivf.   Given leg numbness/weakness, consider MR in AM when available if symptoms persist. No focal extremity sts or tight compartments noted. Distal pulses palp bil.  Med service contacted for admission.      Mirna Mires, MD 12/20/13 [phone]

## 2013-12-20 NOTE — ED Notes (Signed)
Pt has a urinal at the bedside.

## 2013-12-20 NOTE — ED Notes (Signed)
Troponin given to Dr. Ashok Cordia.

## 2013-12-20 NOTE — ED Provider Notes (Signed)
Medical screening examination/treatment/procedure(s) were conducted as a shared visit with non-physician practitioner(s) and myself.  I personally evaluated the patient during the encounter.  See prior blank note.    Mirna Mires, MD 12/20/13 [phone]

## 2013-12-20 NOTE — ED Notes (Signed)
Family at bedside. 

## 2013-12-20 NOTE — ED Notes (Signed)
Patient transported to MRI 

## 2013-12-20 NOTE — Progress Notes (Signed)
ANTICOAGULATION CONSULT NOTE - Initial Consult  Pharmacy Consult for heparin Indication: chest pain/ACS  No Known Allergies  Patient Measurements: Height: 5\' 10"  (177.8 cm) Weight: 190 lb (86.183 kg) IBW/kg (Calculated) : 73 Heparin Dosing Weight: 86kg  Vital Signs: Temp: 98.6 F (37 C) (08/06 0137) Temp src: Oral (08/06 0137) BP: 155/92 mmHg (08/06 0628) Pulse Rate: 100 (08/06 0645)  Labs:  Recent Labs  12/20/13 0119  HGB 15.2  HCT 43.7  PLT PLATELET CLUMPS NOTED ON SMEAR, COUNT APPEARS ADEQUATE  CREATININE 1.05  CKTOTAL 7082*    Estimated Creatinine Clearance: 96.6 ml/min (by C-G formula based on Cr of 1.05).   Medical History: Past Medical History  Diagnosis Date  . Anxiety   . Ulcer     Assessment: 42 YOM presents with generalized body aches and weakness. Known polysubstance abuse, UDS positive for opioids and cocaine.  Troponin elevated in setting of likely rhabdomyolysis (CK elevated).  Orders to start heparin for possible ACS with complaints of abd, R-sided chest pain, and flank pain.    CBC: Hgb OK, plt clumped but count appears adequate per comment  Goal of Therapy:  Heparin level 0.3-0.7 units/ml Monitor platelets by anticoagulation protocol: Yes   Plan:   Heparin 4000 unit bolus then 1200 units/hr  Check 6h heparin level  D/C SQ heparin (no doses given)  Daily heparin level and CBC  Monitor for bleeding  Doreene Eland, PharmD, BCPS.   Pager: [phone]  12/20/2013,7:29 AM

## 2013-12-20 NOTE — Progress Notes (Signed)
Central tele reported patient having cardiac pauses; confirmed on tele monitor; pt is asymptomatic at this time; EKG showed no acute changes from prior EKG; pt did have three confirmed pauses at bedside while restarting his IV; heart rate as low as 65

## 2013-12-20 NOTE — Progress Notes (Signed)
CRITICAL VALUE ALERT  Critical value received:  Troponin 2.91  Date of notification:  12/20/13  Time of notification:  0840  Critical value read back:Yes.    Nurse who received alert:  [email or web address],rn  MD notified (1st page):  elmahi  Time of first page:  0840  MD notified (2nd page):  Time of second page:  Responding MD:  elmahi  Time MD responded:  [phone]

## 2013-12-20 NOTE — ED Notes (Signed)
While in the patients room trying to get vitals on the patient, The patient snatched the blood pressure cuff off

## 2013-12-20 NOTE — Consult Note (Signed)
CARDIOLOGY CONSULT NOTE  Patient ID: Ralph Chavez MRN: [id number] DOB/AGE: 1970-08-23 43 y.o.  Admit date: 12/20/2013 Primary Physician None Primary Cardiologist None Chief Complaint  Elevated troponin  HPI:  The patient presented after a fall.  This may have been related to his polysubstance abuse.  He was found unresponsive.  Upon presentation he has been treated for rhabdomyolysis.  He has a CXR consistent with pneumonia.  Total CK is 7082.  We are called because of a troponin of 2.02.  EKG shows no acute ST T wave changes.  He has no history of coronary disease.  He has had no prior cardiac work up.  He doesn't sound like he is overly active.  However, with walking he denies any cardiovascular complaints.  The patient denies any new symptoms such as chest discomfort, neck or arm discomfort. There has been no new shortness of breath, PND or orthopnea. There have been no reported palpitations, presyncope or syncope.   Past Medical History  Diagnosis Date  . Anxiety   . Ulcer     No past surgical history on file.  No Known Allergies Prescriptions prior to admission  Medication Sig Dispense Refill  . omeprazole (PRILOSEC) 20 MG capsule Take 20 mg by mouth daily.       No family history on file.  History   Social History  . Marital Status: Single    Spouse Name: N/A    Number of Children: N/A  . Years of Education: N/A   Occupational History  . Not on file.   Social History Main Topics  . Smoking status: Current Every Day Smoker    Types: Cigarettes  . Smokeless tobacco: Never Used  . Alcohol Use: Yes  . Drug Use: Yes    Special: Cocaine  . Sexual Activity: Not on file   Other Topics Concern  . Not on file   Social History Narrative  . No narrative on file     ROS:    As stated in the HPI and negative for all other systems.  Physical Exam: Blood pressure 155/92, pulse 100, temperature 98.6 F (37 C), temperature source Oral, resp. rate 25, height 5\' 10"   (1.778 m), weight 190 lb (86.183 kg), SpO2 97.00%.  GENERAL:  Well appearing HEENT:  Pupils equal round and reactive, fundi not visualized, oral mucosa unremarkable NECK:  No jugular venous distention, waveform within normal limits, carotid upstroke brisk and symmetric, no bruits, no thyromegaly LYMPHATICS:  No cervical, inguinal adenopathy LUNGS:  Clear to auscultation bilaterally BACK:  No CVA tenderness CHEST:  Unremarkable HEART:  PMI not displaced or sustained,S1 and S2 within normal limits, no S3, no S4, no clicks, no rubs, no murmurs ABD:  Flat, positive bowel sounds normal in frequency in pitch, no bruits, no rebound, no guarding, no midline pulsatile mass, no hepatomegaly, no splenomegaly EXT:  2 plus pulses throughout, no edema, no cyanosis no clubbing SKIN:  No rashes no nodules NEURO:  Cranial nerves II through XII grossly intact, motor grossly intact throughout PSYCH:  Cognitively intact, oriented to person place and time   Labs: Lab Results  Component Value Date   BUN 21 12/20/2013   Lab Results  Component Value Date   CREATININE 1.05 12/20/2013   Lab Results  Component Value Date   NA 133* 12/20/2013   K 3.6* 12/20/2013   CL 94* 12/20/2013   CO2 23 12/20/2013   No results found for this basename: TROPONINI   Lab Results  Component  Value Date   WBC 14.7* 12/20/2013   HGB 15.2 12/20/2013   HCT 43.7 12/20/2013   MCV 90.9 12/20/2013   PLT PLATELET CLUMPS NOTED ON SMEAR, COUNT APPEARS ADEQUATE 12/20/2013   No results found for this basename: CHOL, HDL, LDLCALC, LDLDIRECT, TRIG, CHOLHDL   Lab Results  Component Value Date   ALT 144* 12/20/2013   AST 215* 12/20/2013   ALKPHOS 89 12/20/2013   BILITOT 0.6 12/20/2013      Radiology:   CXR: The cardiac silhouette appears moderately enlarged, mediastinal silhouette is nonsuspicious. Central pulmonary vascular congestion,  mild interstitial prominence. Minimal bibasilar strandy densities,  with left midlung zone airspace opacity. No  pneumothorax. Soft  tissue planes and included osseous structures are nonsuspicious.   EKG:NSR, rate 97, QTc prolonged no acute ST T wave changes.  12/20/2013  ASSESSMENT AND PLAN:   ELEVATED TROPONIN:  I do not suspect a NSTEMI or demand ischemia.  This could be a nonspecific troponin elevation.  We will need more clinical context including cycled enzymes and an echocardiogram.  Currently he has no symptoms or history that would point to any primary cardiac event.  Further evaluation will be based on the enzyme trend and echo.   SignedMinus Breeding 12/20/2013, 7:24 AM

## 2013-12-20 NOTE — ED Notes (Signed)
Bed: WA25 Expected date:  Expected time:  Means of arrival:  Comments: EMS/44 yo male with leg weakness after using heroin on Monday

## 2013-12-20 NOTE — ED Notes (Signed)
[name] from lab said there is adequate amount of platelets. If we needed a number that it would have to be redrawn.

## 2013-12-21 ENCOUNTER — Inpatient Hospital Stay (HOSPITAL_COMMUNITY): Payer: Self-pay

## 2013-12-21 DIAGNOSIS — M6282 Rhabdomyolysis: Principal | ICD-10-CM

## 2013-12-21 DIAGNOSIS — J189 Pneumonia, unspecified organism: Secondary | ICD-10-CM

## 2013-12-21 DIAGNOSIS — I517 Cardiomegaly: Secondary | ICD-10-CM

## 2013-12-21 DIAGNOSIS — R7401 Elevation of levels of liver transaminase levels: Secondary | ICD-10-CM

## 2013-12-21 DIAGNOSIS — T796XXA Traumatic ischemia of muscle, initial encounter: Secondary | ICD-10-CM

## 2013-12-21 DIAGNOSIS — R29898 Other symptoms and signs involving the musculoskeletal system: Secondary | ICD-10-CM

## 2013-12-21 DIAGNOSIS — R740 Nonspecific elevation of levels of transaminase and lactic acid dehydrogenase [LDH]: Secondary | ICD-10-CM

## 2013-12-21 DIAGNOSIS — F1410 Cocaine abuse, uncomplicated: Secondary | ICD-10-CM

## 2013-12-21 LAB — COMPREHENSIVE METABOLIC PANEL
ALBUMIN: 2.5 g/dL — AB (ref 3.5–5.2)
ALK PHOS: 63 U/L (ref 39–117)
ALT: 96 U/L — AB (ref 0–53)
AST: 89 U/L — ABNORMAL HIGH (ref 0–37)
Anion gap: 10 (ref 5–15)
BUN: 10 mg/dL (ref 6–23)
CO2: 25 mEq/L (ref 19–32)
Calcium: 8.8 mg/dL (ref 8.4–10.5)
Chloride: 103 mEq/L (ref 96–112)
Creatinine, Ser: 0.68 mg/dL (ref 0.50–1.35)
GFR calc Af Amer: >90 mL/min (ref >90)
GFR calc non Af Amer: >90 mL/min (ref >90)
Glucose, Bld: 170 mg/dL — ABNORMAL HIGH (ref 70–99)
POTASSIUM: 3.4 meq/L — AB (ref 3.7–5.3)
SODIUM: 139 meq/L (ref 137–147)
Total Bilirubin: 0.4 mg/dL (ref 0.3–1.2)
Total Protein: 5.9 g/dL — ABNORMAL LOW (ref 6.0–8.3)

## 2013-12-21 LAB — CBC
HEMATOCRIT: 35.2 % — AB (ref 39.0–52.0)
HEMOGLOBIN: 12.2 g/dL — AB (ref 13.0–17.0)
MCH: 31.1 pg (ref 26.0–34.0)
MCHC: 34.7 g/dL (ref 30.0–36.0)
MCV: 89.8 fL (ref 78.0–100.0)
Platelets: 155 10*3/uL (ref 150–400)
RBC: 3.92 MIL/uL — ABNORMAL LOW (ref 4.22–5.81)
RDW: 15.8 % — AB (ref 11.5–15.5)
WBC: 15.3 10*3/uL — ABNORMAL HIGH (ref 4.0–10.5)

## 2013-12-21 LAB — HEPARIN LEVEL (UNFRACTIONATED): Heparin Unfractionated: 0.3 IU/mL (ref 0.30–0.70)

## 2013-12-21 LAB — EXPECTORATED SPUTUM ASSESSMENT W GRAM STAIN, RFLX TO RESP C

## 2013-12-21 LAB — EXPECTORATED SPUTUM ASSESSMENT W REFEX TO RESP CULTURE

## 2013-12-21 MED ORDER — POTASSIUM CHLORIDE CRYS ER 20 MEQ PO TBCR
60.0000 meq | EXTENDED_RELEASE_TABLET | Freq: Once | ORAL | Status: AC
  Administered 2013-12-21: 60 meq via ORAL
  Filled 2013-12-21: qty 3

## 2013-12-21 MED ORDER — AMLODIPINE BESYLATE 2.5 MG PO TABS
2.5000 mg | ORAL_TABLET | Freq: Every day | ORAL | Status: DC
  Administered 2013-12-21: 2.5 mg via ORAL
  Filled 2013-12-21 (×2): qty 1

## 2013-12-21 MED ORDER — HEPARIN (PORCINE) IN NACL 100-0.45 UNIT/ML-% IJ SOLN
1850.0000 [IU]/h | INTRAMUSCULAR | Status: DC
  Filled 2013-12-21 (×2): qty 250

## 2013-12-21 NOTE — Progress Notes (Signed)
[facility] PROGRESS NOTE   Ralph Chavez FBP:[id number] DOB: 1971-04-22 DOA: 12/20/2013 PCP: No PCP Per Patient  HPI/Subjective: Feels better, still short of breath and requires oxygen.  Assessment/Plan: Principal Problem:   Rhabdomyolysis Active Problems:   Paresthesia of both lower extremities with weakness   Body aches   Chest pain   Transaminitis   Polysubstance abuse   Elevated troponin   Elevated troponin Patient presented with troponin of 2.02, went up to 2.91, hemoglobin is trending down. Complaining about chest pain prior to presentation, he is chest pain-free. EKG did not show any evidence of cardiac ischemia. Patient currently on heparin drip, avoid beta blockers as he uses cocaine. Cardiology on board, 2-D echo pending. Please note that patient abuses cocaine.  Aspiration pneumonia  Patient is found to have right-sided pneumonia likely aspiration pneumonia.  Patient is on Unasyn, bronchodilators, mucolytics, oxygen as needed. Patient is on Solu-Medrol, I will discontinue that as no evidence of wheezing for now.  Rhabdomyolysis  The patient is presently complains of bilateral pain, paresthesia, rash, generalized weakness.  He is found to have elevated CK and LFT with AST to ALT ratio nearly 2 is to 1. Patient has CT of lumbar spine showed ileus or swelling, negative MRI of thoracic spine. Pulses are palpable laterally, namely dorsalis pedis, no evidence of compartment syndrome. Has a lot of weakness, was not able to walk today, he fell while he was trying to go to the bathroom 8/7. Continue IV fluid hydration, continue close followup for the renal function.  Transaminitis  Patient denies any alcohol abuse.  Tylenol and ethanol level undetectable. Transaminitis could be secondary to muscular injury/rhabdomyolysis as no elevation in alkaline phosphatase or bilirubin. Check CMP in a.m.   Polysubstance abuse Patient complains of significant pain.  Reported heroin and cocaine abuse.  He is tachycardic and hypertensive therefore I would treat him with low dose of oxycodone.   Code Status: Full code Family Communication: Plan discussed with the patient. Disposition Plan: Remains inpatient   Consultants:  Cardiology  Procedures:  None  Antibiotics:  Units in   Objective: Filed Vitals:   12/21/13 0839  BP: 158/104  Pulse: 44  Temp:   Resp: 25    Intake/Output Summary (Last 24 hours) at 12/21/13 1052 Last data filed at 12/21/13 1000  Gross per 24 hour  Intake 4980.47 ml  Output    525 ml  Net 4455.47 ml   Filed Weights   12/20/13 0001  Weight: 86.183 kg (190 lb)    Exam: General: Alert and awake, oriented x3, not in any acute distress. HEENT: anicteric sclera, pupils reactive to light and accommodation, EOMI CVS: S1-S2 clear, no murmur rubs or gallops Chest: clear to auscultation bilaterally, no wheezing, rales or rhonchi Abdomen: soft nontender, nondistended, normal bowel sounds, no organomegaly Extremities: no cyanosis, clubbing or edema noted bilaterally Neuro: Cranial nerves II-XII intact, no focal neurological deficits  Data Reviewed: Basic Metabolic Panel:  Recent Labs Lab 12/20/13 0119 12/21/13 0344  NA 133* 139  K 3.6* 3.4*  CL 94* 103  CO2 23 25  GLUCOSE 108* 170*  BUN 21 10  CREATININE 1.05 0.68  CALCIUM 8.7 8.8   Liver Function Tests:  Recent Labs Lab 12/20/13 0119 12/21/13 0344  AST 215* 89*  ALT 144* 96*  ALKPHOS 89 63  BILITOT 0.6 0.4  PROT 6.6 5.9*  ALBUMIN 3.0* 2.5*    Recent Labs Lab 12/20/13 0119  LIPASE 21   No results found for this  basename: AMMONIA,  in the last 168 hours CBC:  Recent Labs Lab 12/20/13 0119 12/21/13 0344  WBC 14.7* 15.3*  NEUTROABS 12.0*  --   HGB 15.2 12.2*  HCT 43.7 35.2*  MCV 90.9 89.8  PLT PLATELET CLUMPS NOTED ON SMEAR, COUNT APPEARS ADEQUATE 155   Cardiac Enzymes:  Recent Labs Lab 12/20/13 0119 12/20/13 0755  12/20/13 1450 12/20/13 2115  CKTOTAL 7082*  --   --   --   TROPONINI  --  2.91* 1.53* 1.43*   BNP (last 3 results) No results found for this basename: PROBNP,  in the last 8760 hours CBG: No results found for this basename: GLUCAP,  in the last 168 hours  Micro Recent Results (from the past 240 hour(s))  MRSA PCR SCREENING     Status: None   Collection Time    12/20/13  7:56 AM      Result Value Ref Range Status   MRSA by PCR NEGATIVE  NEGATIVE Final   Comment:            The GeneXpert MRSA Assay (FDA     approved for NASAL specimens     only), is one component of a     comprehensive MRSA colonization     surveillance program. It is not     intended to diagnose MRSA     infection nor to guide or     monitor treatment for     MRSA infections.  CULTURE, EXPECTORATED SPUTUM-ASSESSMENT     Status: None   Collection Time    12/21/13  9:15 AM      Result Value Ref Range Status   Specimen Description SPUTUM   Final   Special Requests NONE   Final   Sputum evaluation     Final   Value: MICROSCOPIC FINDINGS SUGGEST THAT THIS SPECIMEN IS NOT REPRESENTATIVE OF LOWER RESPIRATORY SECRETIONS. PLEASE RECOLLECT.     Gram Stain Report Called to,Read Back By and Verified With: [place], M AT 0942 ON 12/21/13 BY HOBBINS, J.   Report Status 12/21/2013 FINAL   Final     Studies: Dg Chest 2 View  12/20/2013   CLINICAL DATA:  Chest pain, shortness of breath.  EXAM: CHEST  2 VIEW  COMPARISON:  CT of the thoracic spine December 20, 2013  FINDINGS: The cardiac silhouette appears moderately enlarged, mediastinal silhouette is nonsuspicious. Central pulmonary vascular congestion, mild interstitial prominence. Minimal bibasilar strandy densities, with left midlung zone airspace opacity. No pneumothorax. Soft tissue planes and included osseous structures are nonsuspicious.  IMPRESSION: Moderate cardiomegaly, interstitial prominence may reflect pulmonary edema with left midlung zone airspace opacity which could  reflect confluent edema or even pneumonia. Right lung base probable atelectasis.   Electronically Signed   By: Elon Alas   On: 12/20/2013 03:20   Ct Head Wo Contrast  12/20/2013   CLINICAL DATA:  Fall with weakness and pain.  EXAM: CT HEAD WITHOUT CONTRAST  CT CERVICAL SPINE WITHOUT CONTRAST  TECHNIQUE: Multidetector CT imaging of the head and cervical spine was performed following the standard protocol without intravenous contrast. Multiplanar CT image reconstructions of the cervical spine were also generated.  COMPARISON:  None.  FINDINGS: CT HEAD FINDINGS  Skull and Sinuses:Inflammatory mucosal thickening in the paranasal sinuses. No sinus effusion. 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 There is diffuse edema of the scalp. No calvarial fracture.  Orbits: No acute abnormality.  Brain: No evidence of acute abnormality, such as acute infarction, hemorrhage, hydrocephalus,  or mass lesion/mass effect.  CT CERVICAL SPINE FINDINGS  Negative for acute fracture or traumatic subluxation. No prevertebral edema. No gross cervical canal hematoma. Facet degeneration, most notable at C3-4 where there is left foraminal stenosis secondary to spurring.  IMPRESSION: No evidence of intracranial abnormality or cervical spine injury.   Electronically Signed   By: Jorje Guild M.D.   On: 12/20/2013 02:28   Ct Cervical Spine Wo Contrast  12/20/2013   CLINICAL DATA:  Fall with weakness and pain.  EXAM: CT HEAD WITHOUT CONTRAST  CT CERVICAL SPINE WITHOUT CONTRAST  TECHNIQUE: Multidetector CT imaging of the head and cervical spine was performed following the standard protocol without intravenous contrast. Multiplanar CT image reconstructions of the cervical spine were also generated.  COMPARISON:  None.  FINDINGS: CT HEAD FINDINGS  Skull and Sinuses:Inflammatory mucosal thickening in the paranasal sinuses. No sinus effusion. 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 There is diffuse edema of the scalp.  No calvarial fracture.  Orbits: No acute abnormality.  Brain: No evidence of acute abnormality, such as acute infarction, hemorrhage, hydrocephalus, or mass lesion/mass effect.  CT CERVICAL SPINE FINDINGS  Negative for acute fracture or traumatic subluxation. No prevertebral edema. No gross cervical canal hematoma. Facet degeneration, most notable at C3-4 where there is left foraminal stenosis secondary to spurring.  IMPRESSION: No evidence of intracranial abnormality or cervical spine injury.   Electronically Signed   By: Jorje Guild M.D.   On: 12/20/2013 02:28   Ct Thoracic Spine Wo Contrast  12/20/2013   CLINICAL DATA:  Weakness and pain after fall.  EXAM: CT THORACIC AND LUMBAR SPINE WITHOUT CONTRAST  TECHNIQUE: Multidetector CT imaging of the thoracic and lumbar spine was performed without contrast. Multiplanar CT image reconstructions were also generated.  COMPARISON:  None.  FINDINGS: CT THORACIC SPINE FINDINGS  No acute fracture subluxation. No osseous canal or foraminal stenosis. Limited visualization of the canal, no gross intrathecal abnormality.  Dense airspace disease in the right lower lobe. There is patchy airspace opacity in the right upper lobe.  CT LUMBAR SPINE FINDINGS  No acute fracture or traumatic subluxation. There is endplate irregularities at the lower thoracic and upper lumbar level without focal disc narrowing. No visible disc herniation or significant canal/ foraminal stenosis. Partially visualized edema along the mildly expanded left iliopsoas.  IMPRESSION: CT THORACIC SPINE IMPRESSION  *Negative thoracic spine. *Right lower lobe aspiration or pneumonia.  CT LUMBAR SPINE IMPRESSION  *No acute osseous findings. *Edema around the partially visualized left iliopsoas, with muscle in expansion, suggesting strain.   Electronically Signed   By: Jorje Guild M.D.   On: 12/20/2013 02:35   Ct Lumbar Spine Wo Contrast  12/20/2013   CLINICAL DATA:  Weakness and pain after fall.  EXAM: CT  THORACIC AND LUMBAR SPINE WITHOUT CONTRAST  TECHNIQUE: Multidetector CT imaging of the thoracic and lumbar spine was performed without contrast. Multiplanar CT image reconstructions were also generated.  COMPARISON:  None.  FINDINGS: CT THORACIC SPINE FINDINGS  No acute fracture subluxation. No osseous canal or foraminal stenosis. Limited visualization of the canal, no gross intrathecal abnormality.  Dense airspace disease in the right lower lobe. There is patchy airspace opacity in the right upper lobe.  CT LUMBAR SPINE FINDINGS  No acute fracture or traumatic subluxation. There is endplate irregularities at the lower thoracic and upper lumbar level without focal disc narrowing. No visible disc herniation or significant canal/ foraminal stenosis. Partially visualized edema along the mildly expanded left  iliopsoas.  IMPRESSION: CT THORACIC SPINE IMPRESSION  *Negative thoracic spine. *Right lower lobe aspiration or pneumonia.  CT LUMBAR SPINE IMPRESSION  *No acute osseous findings. *Edema around the partially visualized left iliopsoas, with muscle in expansion, suggesting strain.   Electronically Signed   By: Jorje Guild M.D.   On: 12/20/2013 02:35   Mr Thoracic Spine Wo Contrast  12/20/2013   CLINICAL DATA:  43 year old male with acute onset inability to walk, bilateral paresthesia is, weakness. Initial encounter. Advises recent heroin use.  EXAM: MRI THORACIC SPINE WITHOUT CONTRAST  TECHNIQUE: Multiplanar, multisequence MR imaging of the thoracic spine was performed. No intravenous contrast was administered.  COMPARISON:  Chest radiographs and thoracic spine CT 0300 hr the same day.  FINDINGS: The examination had to be discontinued prior to completion due to patient request, with axial gradient echo imaging omitted.  Limited sagittal imaging of the cervical spine appears grossly normal.  Thoracic vertebral height and alignment maintained, except for mild chronic superior endplate deformity at W10 which could  be congenital or degenerative. No marrow edema or evidence of acute osseous abnormality.  Axial T2 weighted images are intermittently degraded by motion. There is a very small central disc protrusion at T8-T9. There is no thoracic spinal stenosis or cord compression. Spinal cord signal is within normal limits at all visualized levels.  Abnormal signal in the right lower lobe dependently (series 8, image 29). Patchy right perihilar increased signal (image 15).  Otherwise grossly negative visualized thoracic viscera. Visualized posterior paraspinal soft tissues are within normal limits.  IMPRESSION: 1. No acute or significant findings identified in the thoracic spine. Tiny T8-T9 disc protrusion without neural impingement. 2. Multifocal abnormal signal in the right lung, as seen on the earlier thoracic CT, and may reflect pneumonia or aspiration in this setting.   Electronically Signed   By: Lars Pinks M.D.   On: 12/20/2013 07:53   Dg Knee Complete 4 Views Left  12/20/2013   CLINICAL DATA:  Fall knee pain  EXAM: LEFT KNEE - COMPLETE 4+ VIEW  COMPARISON:  None  FINDINGS: Negative for fracture. Normal alignment. Anterior soft tissue swelling. No joint effusion. Joint spaces are maintained.  IMPRESSION: Anterior soft tissue swelling. Negative for fracture or dislocation.   Electronically Signed   By: Franchot Gallo M.D.   On: 12/20/2013 02:03    Scheduled Meds: . amLODipine  2.5 mg Oral Daily  . ampicillin-sulbactam (UNASYN) IV  3 g Intravenous 4 times per day  . aspirin EC  325 mg Oral Daily  . ipratropium-albuterol  3 mL Nebulization TID  . methylPREDNISolone (SOLU-MEDROL) injection  40 mg Intravenous Q12H  . nicotine  21 mg Transdermal Daily  . pantoprazole  40 mg Oral Daily  . sodium chloride  3 mL Intravenous Q12H   Continuous Infusions: . sodium chloride 150 mL/hr at 12/21/13 0915  . heparin 1,850 Units/hr (12/21/13 0915)       Time spent: 35 minutes    [facility]  [facility] Pager [phone] If 7PM-7AM, please contact night-coverage at [email or web address], password [facility] 12/21/2013, 10:52 AM  LOS: 1 day

## 2013-12-21 NOTE — Progress Notes (Signed)
SUBJECTIVE:  He says that he has had no chest pain.  However, he is SOB   PHYSICAL EXAM Filed Vitals:   12/20/13 2039 12/20/13 2259 12/21/13 0000 12/21/13 0348  BP:  161/99  168/94  Pulse:   71 78  Temp:   98.4 F (36.9 C) 97.5 F (36.4 C)  TempSrc:   Oral Oral  Resp:   23 17  Height:      Weight:      SpO2: 96%  94% 95%   General:  No distress Lungs:  Clear Heart:  RRR, no rub Abdomen:  Positive bowel sounds, no rebound no guarding Extremities:  No edema   LABS: Lab Results  Component Value Date   TROPONINI 1.43* 12/20/2013   Results for orders placed during the hospital encounter of 12/20/13 (from the past 24 hour(s))  ACETAMINOPHEN LEVEL     Status: None   Collection Time    12/20/13  7:55 AM      Result Value Ref Range   Acetaminophen (Tylenol), Serum <15.0  10 - 30 ug/mL  OSMOLALITY     Status: None   Collection Time    12/20/13  7:55 AM      Result Value Ref Range   Osmolality 286  275 - 300 mOsm/kg  ETHANOL     Status: None   Collection Time    12/20/13  7:55 AM      Result Value Ref Range   Alcohol, Ethyl (B) <11  0 - 11 mg/dL  TROPONIN I     Status: Abnormal   Collection Time    12/20/13  7:55 AM      Result Value Ref Range   Troponin I 2.91 (*) <0.30 ng/mL  MRSA PCR SCREENING     Status: None   Collection Time    12/20/13  7:56 AM      Result Value Ref Range   MRSA by PCR NEGATIVE  NEGATIVE  PROTIME-INR     Status: Abnormal   Collection Time    12/20/13  8:28 AM      Result Value Ref Range   Prothrombin Time 15.6 (*) 11.6 - 15.2 seconds   INR 1.24  0.00 - 1.49  HEPARIN LEVEL (UNFRACTIONATED)     Status: Abnormal   Collection Time    12/20/13  2:50 PM      Result Value Ref Range   Heparin Unfractionated <0.10 (*) 0.30 - 0.70 IU/mL  TROPONIN I     Status: Abnormal   Collection Time    12/20/13  2:50 PM      Result Value Ref Range   Troponin I 1.53 (*) <0.30 ng/mL  HEPARIN LEVEL (UNFRACTIONATED)     Status: Abnormal   Collection Time      12/20/13  9:12 PM      Result Value Ref Range   Heparin Unfractionated 0.19 (*) 0.30 - 0.70 IU/mL  TROPONIN I     Status: Abnormal   Collection Time    12/20/13  9:15 PM      Result Value Ref Range   Troponin I 1.43 (*) <0.30 ng/mL  COMPREHENSIVE METABOLIC PANEL     Status: Abnormal   Collection Time    12/21/13  3:44 AM      Result Value Ref Range   Sodium 139  137 - 147 mEq/L   Potassium 3.4 (*) 3.7 - 5.3 mEq/L   Chloride 103  96 - 112 mEq/L   CO2 25  19 - 32 mEq/L   Glucose, Bld 170 (*) 70 - 99 mg/dL   BUN 10  6 - 23 mg/dL   Creatinine, Ser 0.68  0.50 - 1.35 mg/dL   Calcium 8.8  8.4 - 10.5 mg/dL   Total Protein 5.9 (*) 6.0 - 8.3 g/dL   Albumin 2.5 (*) 3.5 - 5.2 g/dL   AST 89 (*) 0 - 37 U/L   ALT 96 (*) 0 - 53 U/L   Alkaline Phosphatase 63  39 - 117 U/L   Total Bilirubin 0.4  0.3 - 1.2 mg/dL   GFR calc non Af Amer >90  >90 mL/min   GFR calc Af Amer >90  >90 mL/min   Anion gap 10  5 - 15  CBC     Status: Abnormal   Collection Time    12/21/13  3:44 AM      Result Value Ref Range   WBC 15.3 (*) 4.0 - 10.5 K/uL   RBC 3.92 (*) 4.22 - 5.81 MIL/uL   Hemoglobin 12.2 (*) 13.0 - 17.0 g/dL   HCT 35.2 (*) 39.0 - 52.0 %   MCV 89.8  78.0 - 100.0 fL   MCH 31.1  26.0 - 34.0 pg   MCHC 34.7  30.0 - 36.0 g/dL   RDW 15.8 (*) 11.5 - 15.5 %   Platelets 155  150 - 400 K/uL  HEPARIN LEVEL (UNFRACTIONATED)     Status: None   Collection Time    12/21/13  3:44 AM      Result Value Ref Range   Heparin Unfractionated 0.30  0.30 - 0.70 IU/mL    Intake/Output Summary (Last 24 hours) at 12/21/13 0654 Last data filed at 12/21/13 0200  Gross per 24 hour  Intake 4160.88 ml  Output    500 ml  Net 3660.88 ml    ASSESSMENT AND PLAN:  ELEVATED TROPONIN:  Troponin down with only slight area under the curve.  Echocardiogram is pending.  EKG last night showed no acute changes.   Pauses noted on Telemetry  HTN:  I will start a low dose of Norvasc   Minus Breeding 12/21/2013 6:54 AM

## 2013-12-21 NOTE — Progress Notes (Signed)
12/21/13 0700  What Happened  Was fall witnessed? No  Was patient injured? No  Patient found on floor  Found by Staff-comment  Stated prior activity to/from bed, chair, or stretcher (pt wanted to stand at bedside to urinate)  Follow Up  MD notified yes  Time MD notified 0800  Family notified No- patient refusal  Additional tests No  Simple treatment Dressing  Progress note created (see row info) Yes  Adult Fall Risk Assessment  Risk Factor Category (scoring not indicated) Fall has occurred during this admission (document High fall risk)  Patient's Fall Risk High Fall Risk (>13 points)  Adult Fall Risk Interventions  Required Bundle Interventions *See Row Information* High fall risk - low, moderate, and high requirements implemented  Additional Interventions Fall risk signage;Individualized elimination schedule;Pharmacy review of medications;Protective devices (Comment);PT/OT need assessed if change in mobility from baseline;Reorient/diversional activities with confused patients;Room near nurses station;Secure all tubes/drains;Specialty bed:  Low bed;Use of appropriate toileting equipment (bedpan, BSC, etc.)  Vitals  Pulse Rate 79  ECG Heart Rate 72  Cardiac Rhythm NSR  Resp 18  Oxygen Therapy  SpO2 96 %  O2 Device Nasal cannula  O2 Flow Rate (L/min) 2 L/min  Pain Assessment  Pain Assessment No/denies pain  PCA/Epidural/Spinal Assessment  Respiratory Pattern Regular;Unlabored;Symmetrical  Neurological  Neuro (WDL) WDL  Level of Consciousness Alert  Orientation Level Oriented X4  Cognition Follows commands  Speech Clear  Pupil Assessment  No  Neuro Symptoms None  Glasgow Coma Scale  Eye Opening 4  Best Verbal Response (NON-intubated) 5  Best Motor Response 6  Glasgow Coma Scale Score 15  Musculoskeletal  Musculoskeletal (WDL) X  Generalized Weakness Yes  Musculoskeletal Details  RLE Weakness  LLE Weakness  Integumentary  Integumentary (WDL) X  Skin Color  Appropriate for ethnicity  Skin Integrity Abrasion  Abrasion Location Knee  Abrasion Location Orientation Left  Abrasion Intervention Cleansed;Thin film (abrasion present prior to fall, now open and slightly bleedi)  Ecchymosis Location Head  Ecchymosis Location Orientation Posterior  Skin Turgor Non-tenting

## 2013-12-21 NOTE — Progress Notes (Signed)
ANTICOAGULATION CONSULT NOTE - Follow Up Consult  Pharmacy Consult for Heparin Indication: chest pain/ACS  No Known Allergies  Patient Measurements: Height: 5\' 10"  (177.8 cm) Weight: 190 lb (86.183 kg) IBW/kg (Calculated) : 73 Heparin Dosing Weight:   Vital Signs: Temp: 97.5 F (36.4 C) (08/07 0348) Temp src: Oral (08/07 0348) BP: 168/94 mmHg (08/07 0348) Pulse Rate: 78 (08/07 0348)  Labs:  Recent Labs  12/20/13 0119 12/20/13 0755 12/20/13 0828 12/20/13 1450 12/20/13 2112 12/20/13 2115 12/21/13 0344  HGB 15.2  --   --   --   --   --  12.2*  HCT 43.7  --   --   --   --   --  35.2*  PLT PLATELET CLUMPS NOTED ON SMEAR, COUNT APPEARS ADEQUATE  --   --   --   --   --  155  LABPROT  --   --  15.6*  --   --   --   --   INR  --   --  1.24  --   --   --   --   HEPARINUNFRC  --   --   --  <0.10* 0.19*  --  0.30  CREATININE 1.05  --   --   --   --   --  0.68  CKTOTAL 7082*  --   --   --   --   --   --   TROPONINI  --  2.91*  --  1.53*  --  1.43*  --     Estimated Creatinine Clearance: 126.7 ml/min (by C-G formula based on Cr of 0.68).   Medications:  Infusions:  . sodium chloride 150 mL/hr at 12/20/13 2242  . heparin      Assessment: Patient with heparin level at goal after prior dose increase.  No issues per RN.  Heparin level at lowest value of goal range.  Heparin level also drawn a little early as well.  Goal of Therapy:  Heparin level 0.3-0.7 units/ml Monitor platelets by anticoagulation protocol: Yes   Plan:  Increase heparin to 1850 units/hr Recheck level at 1300.  Ralph Chavez, Ralph Chavez 12/21/2013,6:02 AM

## 2013-12-21 NOTE — Progress Notes (Signed)
Echo Lab  2D Echocardiogram completed.  Lianna Sitzmann L Jashan Cotten, RDCS 12/21/2013 9:25 AM

## 2013-12-22 DIAGNOSIS — R52 Pain, unspecified: Secondary | ICD-10-CM

## 2013-12-22 LAB — COMPREHENSIVE METABOLIC PANEL
ALT: 92 U/L — ABNORMAL HIGH (ref 0–53)
ANION GAP: 12 (ref 5–15)
AST: 62 U/L — ABNORMAL HIGH (ref 0–37)
Albumin: 2.6 g/dL — ABNORMAL LOW (ref 3.5–5.2)
Alkaline Phosphatase: 79 U/L (ref 39–117)
BILIRUBIN TOTAL: 0.3 mg/dL (ref 0.3–1.2)
BUN: 12 mg/dL (ref 6–23)
CO2: 22 mEq/L (ref 19–32)
CREATININE: 0.74 mg/dL (ref 0.50–1.35)
Calcium: 9.1 mg/dL (ref 8.4–10.5)
Chloride: 106 mEq/L (ref 96–112)
GFR calc Af Amer: >90 mL/min (ref >90)
GFR calc non Af Amer: >90 mL/min (ref >90)
Glucose, Bld: 156 mg/dL — ABNORMAL HIGH (ref 70–99)
Potassium: 3.5 mEq/L — ABNORMAL LOW (ref 3.7–5.3)
Sodium: 140 mEq/L (ref 137–147)
Total Protein: 5.8 g/dL — ABNORMAL LOW (ref 6.0–8.3)

## 2013-12-22 LAB — CBC
HEMATOCRIT: 36.4 % — AB (ref 39.0–52.0)
Hemoglobin: 12.2 g/dL — ABNORMAL LOW (ref 13.0–17.0)
MCH: 30.8 pg (ref 26.0–34.0)
MCHC: 33.5 g/dL (ref 30.0–36.0)
MCV: 91.9 fL (ref 78.0–100.0)
PLATELETS: 196 10*3/uL (ref 150–400)
RBC: 3.96 MIL/uL — ABNORMAL LOW (ref 4.22–5.81)
RDW: 16.2 % — ABNORMAL HIGH (ref 11.5–15.5)
WBC: 18.8 10*3/uL — ABNORMAL HIGH (ref 4.0–10.5)

## 2013-12-22 MED ORDER — AMLODIPINE BESYLATE 5 MG PO TABS
5.0000 mg | ORAL_TABLET | Freq: Every day | ORAL | Status: DC
  Administered 2013-12-22 – 2013-12-25 (×3): 5 mg via ORAL
  Filled 2013-12-22 (×4): qty 1

## 2013-12-22 MED ORDER — POTASSIUM CHLORIDE CRYS ER 20 MEQ PO TBCR
40.0000 meq | EXTENDED_RELEASE_TABLET | Freq: Four times a day (QID) | ORAL | Status: AC
Start: 2013-12-22 — End: 2013-12-22
  Administered 2013-12-22 (×2): 40 meq via ORAL
  Filled 2013-12-22 (×2): qty 2

## 2013-12-22 MED ORDER — HYDROCOD POLST-CHLORPHEN POLST 10-8 MG/5ML PO LQCR
5.0000 mL | Freq: Two times a day (BID) | ORAL | Status: DC | PRN
Start: 2013-12-22 — End: 2013-12-25
  Administered 2013-12-22 – 2013-12-24 (×4): 5 mL via ORAL
  Filled 2013-12-22 (×4): qty 5

## 2013-12-22 MED ORDER — FUROSEMIDE 10 MG/ML IJ SOLN
40.0000 mg | Freq: Once | INTRAMUSCULAR | Status: AC
  Administered 2013-12-22: 40 mg via INTRAVENOUS
  Filled 2013-12-22: qty 4

## 2013-12-22 MED ORDER — DIPHENHYDRAMINE HCL 25 MG PO CAPS
25.0000 mg | ORAL_CAPSULE | Freq: Once | ORAL | Status: AC
  Administered 2013-12-22: 25 mg via ORAL
  Filled 2013-12-22: qty 1

## 2013-12-22 NOTE — Progress Notes (Addendum)
Pt c/o ribcage pain r/t coiughing. Pt oob to chair winded and coughing. Audible wheezes noted. Call placed to MD.

## 2013-12-22 NOTE — Plan of Care (Signed)
Problem: Phase I Progression Outcomes Goal: Other Phase I Outcomes/Goals Outcome: Progressing Order for PT to assess and eval

## 2013-12-22 NOTE — Progress Notes (Signed)
[facility] PROGRESS NOTE   Ralph Chavez ENI:[id number] DOB: February 24, 1971 DOA: 12/20/2013 PCP: No PCP Per Patient  HPI/Subjective: Denies pain, still short of breath and requires oxygen. Wants to shower.  Assessment/Plan: Principal Problem:   Rhabdomyolysis Active Problems:   Paresthesia of both lower extremities with weakness   Body aches   Chest pain   Transaminitis   Polysubstance abuse   Elevated troponin   Elevated troponin Patient presented with troponin of 2.02, went up to 2.91, hemoglobin is trending down. Complaining about chest pain prior to presentation, he is chest pain-free. EKG did not show any evidence of cardiac ischemia. Patient currently on heparin drip, avoid beta blockers as he uses cocaine. Cardiology on board, 2-D echo showed ejection fraction of 45-50%. Per cardiology recommendation patient will have cardiac catheterization on Monday.  Pulmonary edema Continues to complain about shortness of breath, chest x-ray showed probable edema versus pneumonitis. This is likely from aggressive IV fluid hydration, IV fluids discontinued, give low doses of IV Lasix. Follow clinically, follow intake/output and daily weights, x-rays if needed.  Aspiration pneumonia  Patient is found to have right-sided pneumonia likely aspiration pneumonia.  Patient is on Unasyn, bronchodilators, mucolytics, oxygen as needed. Patient is on Solu-Medrol, I will discontinue that as no evidence of wheezing for now.  Rhabdomyolysis  The patient is presently complains of bilateral pain, paresthesia, rash, generalized weakness.  He is found to have elevated CK and LFT with AST to ALT ratio nearly 2 is to 1. Patient has CT of lumbar spine showed ileus or swelling, negative MRI of thoracic spine. Pulses are palpable laterally, namely dorsalis pedis, no evidence of compartment syndrome. Has a lot of weakness, was not able to walk today, he fell while he was trying to go to the  bathroom 8/7. Continue IV fluid hydration, continue close followup for the renal function.  Transaminitis  Patient denies any alcohol abuse.  Tylenol and ethanol level undetectable. Transaminitis could be secondary to muscular injury/rhabdomyolysis as no elevation in alkaline phosphatase or bilirubin. Check CMP in a.m.   Polysubstance abuse Patient complains of significant pain. Reported heroin and cocaine abuse.  He is tachycardic and hypertensive therefore I would treat him with low dose of oxycodone.   Code Status: Full code Family Communication: Plan discussed with the patient. Disposition Plan: Remains inpatient   Consultants:  Cardiology  Procedures:  None  Antibiotics:  Units in   Objective: Filed Vitals:   12/22/13 0645  BP: 188/92  Pulse: 54  Temp: 98 F (36.7 C)  Resp: 18    Intake/Output Summary (Last 24 hours) at 12/22/13 1045 Last data filed at 12/22/13 0701  Gross per 24 hour  Intake 2093.28 ml  Output      0 ml  Net 2093.28 ml   Filed Weights   12/20/13 0001  Weight: 86.183 kg (190 lb)    Exam: General: Alert and awake, oriented x3, not in any acute distress. HEENT: anicteric sclera, pupils reactive to light and accommodation, EOMI CVS: S1-S2 clear, no murmur rubs or gallops Chest: clear to auscultation bilaterally, no wheezing, rales or rhonchi Abdomen: soft nontender, nondistended, normal bowel sounds, no organomegaly Extremities: no cyanosis, clubbing or edema noted bilaterally Neuro: Cranial nerves II-XII intact, no focal neurological deficits  Data Reviewed: Basic Metabolic Panel:  Recent Labs Lab 12/20/13 0119 12/21/13 0344 12/22/13 0420  NA 133* 139 140  K 3.6* 3.4* 3.5*  CL 94* 103 106  CO2 23 25 22   GLUCOSE 108* 170* 156*  BUN 21 10 12   CREATININE 1.05 0.68 0.74  CALCIUM 8.7 8.8 9.1   Liver Function Tests:  Recent Labs Lab 12/20/13 0119 12/21/13 0344 12/22/13 0420  AST 215* 89* 62*  ALT 144* 96* 92*    ALKPHOS 89 63 79  BILITOT 0.6 0.4 0.3  PROT 6.6 5.9* 5.8*  ALBUMIN 3.0* 2.5* 2.6*    Recent Labs Lab 12/20/13 0119  LIPASE 21   No results found for this basename: AMMONIA,  in the last 168 hours CBC:  Recent Labs Lab 12/20/13 0119 12/21/13 0344 12/22/13 0420  WBC 14.7* 15.3* 18.8*  NEUTROABS 12.0*  --   --   HGB 15.2 12.2* 12.2*  HCT 43.7 35.2* 36.4*  MCV 90.9 89.8 91.9  PLT PLATELET CLUMPS NOTED ON SMEAR, COUNT APPEARS ADEQUATE 155 196   Cardiac Enzymes:  Recent Labs Lab 12/20/13 0119 12/20/13 0755 12/20/13 1450 12/20/13 2115  CKTOTAL 7082*  --   --   --   TROPONINI  --  2.91* 1.53* 1.43*   BNP (last 3 results) No results found for this basename: PROBNP,  in the last 8760 hours CBG: No results found for this basename: GLUCAP,  in the last 168 hours  Micro Recent Results (from the past 240 hour(s))  MRSA PCR SCREENING     Status: None   Collection Time    12/20/13  7:56 AM      Result Value Ref Range Status   MRSA by PCR NEGATIVE  NEGATIVE Final   Comment:            The GeneXpert MRSA Assay (FDA     approved for NASAL specimens     only), is one component of a     comprehensive MRSA colonization     surveillance program. It is not     intended to diagnose MRSA     infection nor to guide or     monitor treatment for     MRSA infections.  CULTURE, EXPECTORATED SPUTUM-ASSESSMENT     Status: None   Collection Time    12/21/13  9:15 AM      Result Value Ref Range Status   Specimen Description SPUTUM   Final   Special Requests NONE   Final   Sputum evaluation     Final   Value: MICROSCOPIC FINDINGS SUGGEST THAT THIS SPECIMEN IS NOT REPRESENTATIVE OF LOWER RESPIRATORY SECRETIONS. PLEASE RECOLLECT.     Gram Stain Report Called to,Read Back By and Verified With: [place], M AT 0942 ON 12/21/13 BY HOBBINS, J.   Report Status 12/21/2013 FINAL   Final     Studies: Dg Chest 2 View  12/21/2013   CLINICAL DATA:  Shortness of breath, cough, smoker  EXAM: CHEST   2 VIEW  COMPARISON:  12/20/2013  FINDINGS: Cardiomediastinal silhouette is stable. Worsening central mild interstitial prominence suspicious for mild edema or pneumonitis. No segmental infiltrate.  IMPRESSION: Worsening central mild interstitial prominence suspicious for edema or pneumonitis. No segmental infiltrate.   Electronically Signed   By: Lahoma Crocker M.D.   On: 12/21/2013 14:57    Scheduled Meds: . amLODipine  5 mg Oral Daily  . ampicillin-sulbactam (UNASYN) IV  3 g Intravenous 4 times per day  . aspirin EC  325 mg Oral Daily  . ipratropium-albuterol  3 mL Nebulization TID  . nicotine  21 mg Transdermal Daily  . pantoprazole  40 mg Oral Daily  . potassium chloride  40 mEq Oral Q6H  . sodium chloride  3 mL Intravenous Q12H   Continuous Infusions:       Time spent: 35 minutes    [facility]  [facility] Pager [phone] If 7PM-7AM, please contact night-coverage at [email or web address], password [facility] 12/22/2013, 10:45 AM  LOS: 2 days

## 2013-12-22 NOTE — Progress Notes (Signed)
SUBJECTIVE:  He reports chest pain overnight on both sides of his chest.  No substernal pain.     PHYSICAL EXAM Filed Vitals:   12/21/13 1425 12/21/13 2041 12/21/13 2148 12/22/13 0645  BP: 167/87  161/83 188/92  Pulse: 71  50 54  Temp: 97.5 F (36.4 C)  97.9 F (36.6 C) 98 F (36.7 C)  TempSrc: Oral  Oral Oral  Resp: 20  18 18   Height:      Weight:      SpO2: 100% 100% 98% 100%   General:  No distress Lungs:  Clear Heart:  RRR, no rub Abdomen:  Positive bowel sounds, no rebound no guarding Extremities:  No edema   LABS:  Results for orders placed during the hospital encounter of 12/20/13 (from the past 24 hour(s))  CULTURE, EXPECTORATED SPUTUM-ASSESSMENT     Status: None   Collection Time    12/21/13  9:15 AM      Result Value Ref Range   Specimen Description SPUTUM     Special Requests NONE     Sputum evaluation       Value: MICROSCOPIC FINDINGS SUGGEST THAT THIS SPECIMEN IS NOT REPRESENTATIVE OF LOWER RESPIRATORY SECRETIONS. PLEASE RECOLLECT.     Gram Stain Report Called to,Read Back By and Verified With: [place], M AT [phone] ON 12/21/13 BY HOBBINS, J.   Report Status 12/21/2013 FINAL    CBC     Status: Abnormal   Collection Time    12/22/13  4:20 AM      Result Value Ref Range   WBC 18.8 (*) 4.0 - 10.5 K/uL   RBC 3.96 (*) 4.22 - 5.81 MIL/uL   Hemoglobin 12.2 (*) 13.0 - 17.0 g/dL   HCT 36.4 (*) 39.0 - 52.0 %   MCV 91.9  78.0 - 100.0 fL   MCH 30.8  26.0 - 34.0 pg   MCHC 33.5  30.0 - 36.0 g/dL   RDW 16.2 (*) 11.5 - 15.5 %   Platelets 196  150 - 400 K/uL  COMPREHENSIVE METABOLIC PANEL     Status: Abnormal   Collection Time    12/22/13  4:20 AM      Result Value Ref Range   Sodium 140  137 - 147 mEq/L   Potassium 3.5 (*) 3.7 - 5.3 mEq/L   Chloride 106  96 - 112 mEq/L   CO2 22  19 - 32 mEq/L   Glucose, Bld 156 (*) 70 - 99 mg/dL   BUN 12  6 - 23 mg/dL   Creatinine, Ser 0.74  0.50 - 1.35 mg/dL   Calcium 9.1  8.4 - 10.5 mg/dL   Total Protein 5.8 (*) 6.0 - 8.3  g/dL   Albumin 2.6 (*) 3.5 - 5.2 g/dL   AST 62 (*) 0 - 37 U/L   ALT 92 (*) 0 - 53 U/L   Alkaline Phosphatase 79  39 - 117 U/L   Total Bilirubin 0.3  0.3 - 1.2 mg/dL   GFR calc non Af Amer >90  >90 mL/min   GFR calc Af Amer >90  >90 mL/min   Anion gap 12  5 - 15    Intake/Output Summary (Last 24 hours) at 12/22/13 0733 Last data filed at 12/21/13 2308  Gross per 24 hour  Intake 1813.97 ml  Output    525 ml  Net 1288.97 ml    ASSESSMENT AND PLAN:  ELEVATED TROPONIN:  Troponin elevation.  Echo with EF at 50% with probable septal  hypokinesis.  Given this cardiac cath is indicated.   This would be Monday.  The patient understands that risks included but are not limited to stroke (1 in 1000), death (1 in 68), kidney failure [usually temporary] (1 in 500), bleeding (1 in 200), allergic reaction [possibly serious] (1 in 200).  The patient understands and agrees to proceed.    HTN:  I will increase Norvasc.  I will avoid beta blocker with the history of cocaine abuse.   Minus Breeding 12/22/2013 7:33 AM

## 2013-12-23 DIAGNOSIS — IMO0001 Reserved for inherently not codable concepts without codable children: Secondary | ICD-10-CM

## 2013-12-23 DIAGNOSIS — R0789 Other chest pain: Secondary | ICD-10-CM

## 2013-12-23 DIAGNOSIS — F1910 Other psychoactive substance abuse, uncomplicated: Secondary | ICD-10-CM

## 2013-12-23 LAB — COMPREHENSIVE METABOLIC PANEL
ALK PHOS: 71 U/L (ref 39–117)
ALT: 82 U/L — AB (ref 0–53)
AST: 40 U/L — ABNORMAL HIGH (ref 0–37)
Albumin: 2.5 g/dL — ABNORMAL LOW (ref 3.5–5.2)
Anion gap: 11 (ref 5–15)
BUN: 11 mg/dL (ref 6–23)
CALCIUM: 8.9 mg/dL (ref 8.4–10.5)
CO2: 27 mEq/L (ref 19–32)
Chloride: 101 mEq/L (ref 96–112)
Creatinine, Ser: 0.75 mg/dL (ref 0.50–1.35)
GLUCOSE: 105 mg/dL — AB (ref 70–99)
Potassium: 3.5 mEq/L — ABNORMAL LOW (ref 3.7–5.3)
SODIUM: 139 meq/L (ref 137–147)
Total Bilirubin: 0.6 mg/dL (ref 0.3–1.2)
Total Protein: 5.8 g/dL — ABNORMAL LOW (ref 6.0–8.3)

## 2013-12-23 LAB — HEPATITIS PANEL, ACUTE
HCV AB: NEGATIVE
Hep A IgM: NONREACTIVE
Hep B C IgM: NONREACTIVE
Hepatitis B Surface Ag: NEGATIVE

## 2013-12-23 LAB — CBC
HEMATOCRIT: 39.9 % (ref 39.0–52.0)
HEMOGLOBIN: 13.4 g/dL (ref 13.0–17.0)
MCH: 30.7 pg (ref 26.0–34.0)
MCHC: 33.6 g/dL (ref 30.0–36.0)
MCV: 91.5 fL (ref 78.0–100.0)
Platelets: 211 10*3/uL (ref 150–400)
RBC: 4.36 MIL/uL (ref 4.22–5.81)
RDW: 15.4 % (ref 11.5–15.5)
WBC: 15.8 10*3/uL — ABNORMAL HIGH (ref 4.0–10.5)

## 2013-12-23 MED ORDER — GUAIFENESIN ER 600 MG PO TB12
1200.0000 mg | ORAL_TABLET | Freq: Two times a day (BID) | ORAL | Status: DC
  Administered 2013-12-23 – 2013-12-25 (×4): 1200 mg via ORAL
  Filled 2013-12-23 (×6): qty 2

## 2013-12-23 MED ORDER — SODIUM CHLORIDE 0.9 % IJ SOLN
3.0000 mL | Freq: Two times a day (BID) | INTRAMUSCULAR | Status: DC
  Administered 2013-12-23 (×2): 3 mL via INTRAVENOUS

## 2013-12-23 MED ORDER — FUROSEMIDE 10 MG/ML IJ SOLN
60.0000 mg | Freq: Once | INTRAMUSCULAR | Status: AC
  Administered 2013-12-23: 60 mg via INTRAVENOUS
  Filled 2013-12-23: qty 6

## 2013-12-23 MED ORDER — ASPIRIN 81 MG PO CHEW
81.0000 mg | CHEWABLE_TABLET | ORAL | Status: AC
  Administered 2013-12-24: 81 mg via ORAL
  Filled 2013-12-23 (×2): qty 1

## 2013-12-23 MED ORDER — LOSARTAN POTASSIUM 25 MG PO TABS
25.0000 mg | ORAL_TABLET | Freq: Every day | ORAL | Status: DC
  Administered 2013-12-23 – 2013-12-25 (×2): 25 mg via ORAL
  Filled 2013-12-23 (×3): qty 1

## 2013-12-23 MED ORDER — SODIUM CHLORIDE 0.9 % IJ SOLN: 3.0000 mL | INTRAMUSCULAR | Status: DC | PRN

## 2013-12-23 MED ORDER — SODIUM CHLORIDE 0.9 % IV SOLN
1.0000 mL/kg/h | INTRAVENOUS | Status: DC
  Administered 2013-12-24: 1 mL/kg/h via INTRAVENOUS

## 2013-12-23 MED ORDER — POTASSIUM CHLORIDE CRYS ER 20 MEQ PO TBCR
40.0000 meq | EXTENDED_RELEASE_TABLET | Freq: Four times a day (QID) | ORAL | Status: AC
  Administered 2013-12-23 (×2): 40 meq via ORAL
  Filled 2013-12-23 (×2): qty 2

## 2013-12-23 MED ORDER — METHYLPREDNISOLONE SODIUM SUCC 125 MG IJ SOLR
80.0000 mg | Freq: Three times a day (TID) | INTRAMUSCULAR | Status: DC
  Administered 2013-12-23 – 2013-12-25 (×5): 80 mg via INTRAVENOUS
  Filled 2013-12-23 (×9): qty 1.28

## 2013-12-23 MED ORDER — SODIUM CHLORIDE 0.9 % IV SOLN: 250.0000 mL | INTRAVENOUS | Status: DC | PRN

## 2013-12-23 NOTE — Progress Notes (Signed)
Pt displayed and verbalized extreme displeasure with morning vitals and weight using multiple curse words saying that these things were necessary. Attempted to educate patient, who only continued to curse and Therapist, sports and tech. Pt given pain medicine and heat back to help relieve pain.

## 2013-12-23 NOTE — Evaluation (Signed)
Physical Therapy Evaluation Patient Details Name: Ralph Chavez MRN: [id number] DOB: 1970/12/19 Today's Date: 12/23/2013   History of Present Illness  43 yo male admitted with fall, rhabdomyolysis, pna. Hx of polysubstance abuse (heroin,cocaine). Pt states he has been living in a motel.   Clinical Impression  On eval, pt was Min assist for mobility-able to ambulate ~40 feet. Pt declined to ambulate any farther. Dyspnea 2/4 with activity. Pt c/o chest, R flank pain with activity. Easily agitated and argumentative. Recommend daily mobility with nursing supervision in addition to PT sessions.     Follow Up Recommendations No PT follow up    Equipment Recommendations   (to be determined-will continue to assess as able)    Recommendations for Other Services       Precautions / Restrictions Precautions Precautions: Fall Restrictions Weight Bearing Restrictions: No      Mobility  Bed Mobility               General bed mobility comments: pt sitting in recliner  Transfers Overall transfer level: Needs assistance   Transfers: Sit to/from Stand Sit to Stand: Min guard         General transfer comment: close guard for safety.   Ambulation/Gait Ambulation/Gait assistance: Min assist Ambulation Distance (Feet): 40 Feet (25 feet with walker, 15 feet without) Assistive device: Rolling walker (2 wheeled);None Gait Pattern/deviations: Step-through pattern;Decreased stride length     General Gait Details: unsteady. Began ambulation with RW however pt then stated he felt it was increasing his pain. when offered to try without walker, pt shouted "No!" buth then pushed walker away abruptly a few feet later. dyspnea 2/4.   Stairs            Wheelchair Mobility    Modified Rankin (Stroke Patients Only)       Balance                                             Pertinent Vitals/Pain Pain Assessment: 0-10 Pain Score: 7  Pain Location: chest, R  side    Home Living Family/patient expects to be discharged to:: Private residence Living Arrangements: Alone   Type of Home:  (Motel) Home Access: Level entry       Home Equipment: None      Prior Function Level of Independence: Independent               Hand Dominance        Extremity/Trunk Assessment   Upper Extremity Assessment: Overall WFL for tasks assessed           Lower Extremity Assessment: Generalized weakness      Cervical / Trunk Assessment: Normal  Communication   Communication: No difficulties  Cognition Arousal/Alertness: Awake/alert Behavior During Therapy: WFL for tasks assessed/performed Overall Cognitive Status: Within Functional Limits for tasks assessed                      General Comments      Exercises        Assessment/Plan    PT Assessment Patient needs continued PT services  PT Diagnosis Difficulty walking;Generalized weakness;Acute pain   PT Problem List Decreased strength;Decreased activity tolerance;Decreased balance;Decreased mobility;Pain;Decreased knowledge of use of DME  PT Treatment Interventions DME instruction;Gait training;Functional mobility training;Therapeutic activities;Patient/family education;Balance training;Therapeutic exercise   PT Goals (Current goals can be found in  the Care Plan section) Acute Rehab PT Goals Patient Stated Goal: less pain.  PT Goal Formulation: With patient Time For Goal Achievement: 01/06/14 Potential to Achieve Goals: Fair    Frequency Min 3X/week   Barriers to discharge        Co-evaluation               End of Session   Activity Tolerance: Patient limited by fatigue;Patient limited by pain Patient left: in chair;with call bell/phone within reach;with chair alarm set           Time: 1017-5102 PT Time Calculation (min): 11 min   Charges:   PT Evaluation $Initial PT Evaluation Tier I: 1 Procedure PT Treatments $Gait Training: 8-22 mins   PT  G Codes:          Weston Anna, MPT Pager: [phone]

## 2013-12-23 NOTE — Progress Notes (Signed)
    SUBJECTIVE:  He thinks that he broke a rib coughing.  He has sputum productive of yellow sputum  PHYSICAL EXAM Filed Vitals:   12/22/13 1939 12/22/13 2046 12/23/13 0500 12/23/13 0532  BP:  168/84  155/91  Pulse:  67  83  Temp:  98.4 F (36.9 C)  97.4 F (36.3 C)  TempSrc:  Oral  Oral  Resp:  18  20  Height:      Weight:   221 lb 6.4 oz (100.426 kg)   SpO2: 98% 98%  98%   General:  No distress Lungs:  Clear Heart:  RRR, no rub Abdomen:  Positive bowel sounds, no rebound no guarding Extremities:  No edema   LABS:  Results for orders placed during the hospital encounter of 12/20/13 (from the past 24 hour(s))  CBC     Status: Abnormal   Collection Time    12/23/13  3:55 AM      Result Value Ref Range   WBC 15.8 (*) 4.0 - 10.5 K/uL   RBC 4.36  4.22 - 5.81 MIL/uL   Hemoglobin 13.4  13.0 - 17.0 g/dL   HCT 39.9  39.0 - 52.0 %   MCV 91.5  78.0 - 100.0 fL   MCH 30.7  26.0 - 34.0 pg   MCHC 33.6  30.0 - 36.0 g/dL   RDW 15.4  11.5 - 15.5 %   Platelets 211  150 - 400 K/uL  COMPREHENSIVE METABOLIC PANEL     Status: Abnormal   Collection Time    12/23/13  3:55 AM      Result Value Ref Range   Sodium 139  137 - 147 mEq/L   Potassium 3.5 (*) 3.7 - 5.3 mEq/L   Chloride 101  96 - 112 mEq/L   CO2 27  19 - 32 mEq/L   Glucose, Bld 105 (*) 70 - 99 mg/dL   BUN 11  6 - 23 mg/dL   Creatinine, Ser 0.75  0.50 - 1.35 mg/dL   Calcium 8.9  8.4 - 10.5 mg/dL   Total Protein 5.8 (*) 6.0 - 8.3 g/dL   Albumin 2.5 (*) 3.5 - 5.2 g/dL   AST 40 (*) 0 - 37 U/L   ALT 82 (*) 0 - 53 U/L   Alkaline Phosphatase 71  39 - 117 U/L   Total Bilirubin 0.6  0.3 - 1.2 mg/dL   GFR calc non Af Amer >90  >90 mL/min   GFR calc Af Amer >90  >90 mL/min   Anion gap 11  5 - 15    Intake/Output Summary (Last 24 hours) at 12/23/13 0736 Last data filed at 12/23/13 0600  Gross per 24 hour  Intake   1660 ml  Output   3025 ml  Net  -1365 ml    ASSESSMENT AND PLAN:  ELEVATED TROPONIN:  Troponin elevation.   Echo with EF at 50% with probable septal hypokinesis.  Given this and chest pain cardiac cath is indicated.   He is on the board for Monday.   Of note if he has disease treated with a stent I would lean toward a BMS for compliance reasons.    HTN:  I will increase Norvasc.  I will avoid beta blocker with the history of cocaine abuse.  Cozaar 25 mg as well.     Jeneen Rinks [facility] 12/23/2013 7:36 AM

## 2013-12-23 NOTE — Progress Notes (Signed)
[facility] PROGRESS NOTE   Ralph Chavez ZMO:[id number] DOB: February 16, 1971 DOA: 12/20/2013 PCP: No PCP Per Patient  HPI/Subjective: Had a shower yesterday, per nursing staff patient was very wheezy when he ambulates. Received Lasix did very well, over 3 L of fluids were out.  Assessment/Plan: Principal Problem:   Rhabdomyolysis Active Problems:   Paresthesia of both lower extremities with weakness   Body aches   Chest pain   Transaminitis   Polysubstance abuse   Elevated troponin   Elevated troponin Patient presented with troponin of 2.02, went up to 2.91, hemoglobin is trending down. Complaining about chest pain prior to presentation, he is chest pain-free. EKG did not show any evidence of cardiac ischemia. Patient currently on heparin drip, avoid beta blockers as he uses cocaine. Cardiology on board, 2-D echo showed ejection fraction of 45-50%. Per cardiology recommendation patient will have cardiac catheterization on Monday.  Fluid overload/mild pulmonary edema Continues to complain about shortness of breath, chest x-ray showed probable edema versus pneumonitis. This is likely from aggressive IV fluid hydration, IV fluids discontinued, give low doses of IV Lasix. Follow clinically, follow intake/output and daily weights, x-rays if needed.  Aspiration pneumonia  Patient is found to have right-sided pneumonia likely aspiration pneumonia.  Patient is on Unasyn, bronchodilators, mucolytics, oxygen as needed. Patient is on Solu-Medrol, I will discontinue that as no evidence of wheezing for now.  Rhabdomyolysis  The patient is presently complains of bilateral pain, paresthesia, rash, generalized weakness.  He is found to have elevated CK and LFT with AST to ALT ratio nearly 2 is to 1. Patient has CT of lumbar spine showed ileus or swelling, negative MRI of thoracic spine. Pulses are palpable laterally, namely dorsalis pedis, no evidence of compartment syndrome. Has a  lot of weakness, was not able to walk today, he fell while he was trying to go to the bathroom 8/7. Continue IV fluid hydration, continue close followup for the renal function.  Transaminitis  Patient denies any alcohol abuse.  Tylenol and ethanol level undetectable. Transaminitis could be secondary to muscular injury/rhabdomyolysis as no elevation in alkaline phosphatase or bilirubin. Check CMP in a.m.   Polysubstance abuse Patient complains of significant pain. Reported heroin and cocaine abuse.  He is tachycardic and hypertensive therefore I would treat him with low dose of oxycodone.   Code Status: Full code Family Communication: Plan discussed with the patient. Disposition Plan: Remains inpatient   Consultants:  Cardiology  Procedures:  None  Antibiotics:  Units in   Objective: Filed Vitals:   12/23/13 1006  BP: 148/97  Pulse:   Temp:   Resp:     Intake/Output Summary (Last 24 hours) at 12/23/13 1103 Last data filed at 12/23/13 0806  Gross per 24 hour  Intake   1660 ml  Output   3450 ml  Net  -1790 ml   Filed Weights   12/20/13 0001 12/23/13 0500  Weight: 86.183 kg (190 lb) 100.426 kg (221 lb 6.4 oz)    Exam: General: Alert and awake, oriented x3, not in any acute distress. HEENT: anicteric sclera, pupils reactive to light and accommodation, EOMI CVS: S1-S2 clear, no murmur rubs or gallops Chest: clear to auscultation bilaterally, no wheezing, rales or rhonchi Abdomen: soft nontender, nondistended, normal bowel sounds, no organomegaly Extremities: no cyanosis, clubbing or edema noted bilaterally Neuro: Cranial nerves II-XII intact, no focal neurological deficits  Data Reviewed: Basic Metabolic Panel:  Recent Labs Lab 12/20/13 0119 12/21/13 0344 12/22/13 0420 12/23/13 0355  NA  133* 139 140 139  K 3.6* 3.4* 3.5* 3.5*  CL 94* 103 106 101  CO2 23 25 22 27   GLUCOSE 108* 170* 156* 105*  BUN 21 10 12 11   CREATININE 1.05 0.68 0.74 0.75    CALCIUM 8.7 8.8 9.1 8.9   Liver Function Tests:  Recent Labs Lab 12/20/13 0119 12/21/13 0344 12/22/13 0420 12/23/13 0355  AST 215* 89* 62* 40*  ALT 144* 96* 92* 82*  ALKPHOS 89 63 79 71  BILITOT 0.6 0.4 0.3 0.6  PROT 6.6 5.9* 5.8* 5.8*  ALBUMIN 3.0* 2.5* 2.6* 2.5*    Recent Labs Lab 12/20/13 0119  LIPASE 21   No results found for this basename: AMMONIA,  in the last 168 hours CBC:  Recent Labs Lab 12/20/13 0119 12/21/13 0344 12/22/13 0420 12/23/13 0355  WBC 14.7* 15.3* 18.8* 15.8*  NEUTROABS 12.0*  --   --   --   HGB 15.2 12.2* 12.2* 13.4  HCT 43.7 35.2* 36.4* 39.9  MCV 90.9 89.8 91.9 91.5  PLT PLATELET CLUMPS NOTED ON SMEAR, COUNT APPEARS ADEQUATE 155 196 211   Cardiac Enzymes:  Recent Labs Lab 12/20/13 0119 12/20/13 0755 12/20/13 1450 12/20/13 2115  CKTOTAL 7082*  --   --   --   TROPONINI  --  2.91* 1.53* 1.43*   BNP (last 3 results) No results found for this basename: PROBNP,  in the last 8760 hours CBG: No results found for this basename: GLUCAP,  in the last 168 hours  Micro Recent Results (from the past 240 hour(s))  MRSA PCR SCREENING     Status: None   Collection Time    12/20/13  7:56 AM      Result Value Ref Range Status   MRSA by PCR NEGATIVE  NEGATIVE Final   Comment:            The GeneXpert MRSA Assay (FDA     approved for NASAL specimens     only), is one component of a     comprehensive MRSA colonization     surveillance program. It is not     intended to diagnose MRSA     infection nor to guide or     monitor treatment for     MRSA infections.  CULTURE, EXPECTORATED SPUTUM-ASSESSMENT     Status: None   Collection Time    12/21/13  9:15 AM      Result Value Ref Range Status   Specimen Description SPUTUM   Final   Special Requests NONE   Final   Sputum evaluation     Final   Value: MICROSCOPIC FINDINGS SUGGEST THAT THIS SPECIMEN IS NOT REPRESENTATIVE OF LOWER RESPIRATORY SECRETIONS. PLEASE RECOLLECT.     Gram Stain  Report Called to,Read Back By and Verified With: [place], M AT 0942 ON 12/21/13 BY HOBBINS, J.   Report Status 12/21/2013 FINAL   Final     Studies: Dg Chest 2 View  12/21/2013   CLINICAL DATA:  Shortness of breath, cough, smoker  EXAM: CHEST  2 VIEW  COMPARISON:  12/20/2013  FINDINGS: Cardiomediastinal silhouette is stable. Worsening central mild interstitial prominence suspicious for mild edema or pneumonitis. No segmental infiltrate.  IMPRESSION: Worsening central mild interstitial prominence suspicious for edema or pneumonitis. No segmental infiltrate.   Electronically Signed   By: Lahoma Crocker M.D.   On: 12/21/2013 14:57    Scheduled Meds: . amLODipine  5 mg Oral Daily  . ampicillin-sulbactam (UNASYN) IV  3 g Intravenous  4 times per day  . furosemide  60 mg Intravenous Once  . guaiFENesin  1,200 mg Oral BID  . losartan  25 mg Oral Daily  . methylPREDNISolone (SOLU-MEDROL) injection  80 mg Intravenous 3 times per day  . nicotine  21 mg Transdermal Daily  . pantoprazole  40 mg Oral Daily  . potassium chloride  40 mEq Oral Q6H  . sodium chloride  3 mL Intravenous Q12H   Continuous Infusions:       Time spent: 35 minutes    [facility]  [facility] Pager [phone] If 7PM-7AM, please contact night-coverage at [email or web address], password [facility] 12/23/2013, 11:03 AM  LOS: 3 days

## 2013-12-23 NOTE — Progress Notes (Signed)
Report received from A. Laurance Flatten, RN. Patient is sitting up in chair. Assessment unchanged, denies any needs at this time.

## 2013-12-24 ENCOUNTER — Encounter (HOSPITAL_COMMUNITY): Payer: Self-pay | Admitting: *Deleted

## 2013-12-24 ENCOUNTER — Inpatient Hospital Stay (HOSPITAL_COMMUNITY): Payer: Self-pay

## 2013-12-24 ENCOUNTER — Encounter (HOSPITAL_COMMUNITY)
Admission: EM | Disposition: A | Discharge status: Home / Self Care | Payer: Self-pay | Source: Home / Self Care | Attending: Internal Medicine

## 2013-12-24 DIAGNOSIS — R079 Chest pain, unspecified: Secondary | ICD-10-CM

## 2013-12-24 DIAGNOSIS — R55 Syncope and collapse: Secondary | ICD-10-CM

## 2013-12-24 HISTORY — PX: LEFT HEART CATHETERIZATION WITH CORONARY ANGIOGRAM: SHX5451

## 2013-12-24 LAB — COMPREHENSIVE METABOLIC PANEL
ALT: 73 U/L — ABNORMAL HIGH (ref 0–53)
AST: 31 U/L (ref 0–37)
Albumin: 2.5 g/dL — ABNORMAL LOW (ref 3.5–5.2)
Alkaline Phosphatase: 82 U/L (ref 39–117)
Anion gap: 15 (ref 5–15)
BUN: 17 mg/dL (ref 6–23)
CO2: 24 mEq/L (ref 19–32)
Calcium: 9.4 mg/dL (ref 8.4–10.5)
Chloride: 100 mEq/L (ref 96–112)
Creatinine, Ser: 0.66 mg/dL (ref 0.50–1.35)
GFR calc non Af Amer: >90 mL/min (ref >90)
GLUCOSE: 195 mg/dL — AB (ref 70–99)
Potassium: 4.6 mEq/L (ref 3.7–5.3)
SODIUM: 139 meq/L (ref 137–147)
Total Bilirubin: 0.5 mg/dL (ref 0.3–1.2)
Total Protein: 6.6 g/dL (ref 6.0–8.3)

## 2013-12-24 LAB — CBC
HCT: 42.1 % (ref 39.0–52.0)
Hemoglobin: 14.5 g/dL (ref 13.0–17.0)
MCH: 30.7 pg (ref 26.0–34.0)
MCHC: 34.4 g/dL (ref 30.0–36.0)
MCV: 89 fL (ref 78.0–100.0)
Platelets: 264 10*3/uL (ref 150–400)
RBC: 4.73 MIL/uL (ref 4.22–5.81)
RDW: 14.8 % (ref 11.5–15.5)
WBC: 26.6 10*3/uL — ABNORMAL HIGH (ref 4.0–10.5)

## 2013-12-24 LAB — PRO B NATRIURETIC PEPTIDE: PRO B NATRI PEPTIDE: 538.5 pg/mL — AB (ref 0–125)

## 2013-12-24 SURGERY — LEFT HEART CATHETERIZATION WITH CORONARY ANGIOGRAM
Anesthesia: LOCAL

## 2013-12-24 MED ORDER — HEPARIN SODIUM (PORCINE) 1000 UNIT/ML IJ SOLN
INTRAMUSCULAR | Status: AC
  Filled 2013-12-24: qty 1

## 2013-12-24 MED ORDER — MIDAZOLAM HCL 2 MG/2ML IJ SOLN
INTRAMUSCULAR | Status: AC
  Filled 2013-12-24: qty 2

## 2013-12-24 MED ORDER — FENTANYL CITRATE 0.05 MG/ML IJ SOLN
INTRAMUSCULAR | Status: AC
  Filled 2013-12-24: qty 2

## 2013-12-24 MED ORDER — SODIUM CHLORIDE 0.9 % IV SOLN: INTRAVENOUS | Status: AC

## 2013-12-24 MED ORDER — FUROSEMIDE 10 MG/ML IJ SOLN: 40.0000 mg | Freq: Once | INTRAMUSCULAR | Status: DC

## 2013-12-24 MED ORDER — HEPARIN (PORCINE) IN NACL 2-0.9 UNIT/ML-% IJ SOLN
INTRAMUSCULAR | Status: AC
  Filled 2013-12-24: qty 1000

## 2013-12-24 MED ORDER — VERAPAMIL HCL 2.5 MG/ML IV SOLN
INTRAVENOUS | Status: AC
  Filled 2013-12-24: qty 2

## 2013-12-24 NOTE — H&P (View-Only) (Signed)
Subjective:  Pt has some chest wall discomfort. He thinks that with his coughing he may have cracked a rib.  Objective:   Vital Signs in the last 24 hours: Temp:  [98 F (36.7 C)-98.6 F (37 C)] 98.6 F (37 C) (08/10 0621) Pulse Rate:  [56-90] 56 (08/10 0621) Resp:  [18] 18 (08/10 0621) BP: (146-162)/(78-97) 146/78 mmHg (08/10 0621) SpO2:  [96 %-100 %] 100 % (08/10 0621) Weight:  [221 lb 9 oz (100.5 kg)] 221 lb 9 oz (100.5 kg) (08/10 0621)  Intake/Output from previous day: 08/09 0701 - 08/10 0700 In: 1501.8 [P.O.:1080; I.V.:121.8; IV Piggyback:300] Out: 4650 [Urine:4650]  I/O since admission: +3306  Medications: . amLODipine  5 mg Oral Daily  . ampicillin-sulbactam (UNASYN) IV  3 g Intravenous 4 times per day  . aspirin  81 mg Oral Pre-Cath  . guaiFENesin  1,200 mg Oral BID  . losartan  25 mg Oral Daily  . methylPREDNISolone (SOLU-MEDROL) injection  80 mg Intravenous 3 times per day  . nicotine  21 mg Transdermal Daily  . pantoprazole  40 mg Oral Daily  . sodium chloride  3 mL Intravenous Q12H  . sodium chloride  3 mL Intravenous Q12H    . sodium chloride 1 mL/kg/hr (12/24/13 0439)    Physical Exam:   General appearance: alert, combative and no distress; became distressed when told he could not eat and only have liquids this am in anticipation of cath later today Neck: no adenopathy, no carotid bruit, no JVD, supple, symmetrical, trachea midline and thyroid not enlarged, symmetric, no tenderness/mass/nodules Lungs: clear to auscultation bilaterally Heart: regular rate and rhythm Abdomen: soft, non-tender; bowel sounds normal; no masses,  no organomegaly Extremities: no edema, redness or tenderness in the calves or thighs   Rate: 60  Rhythm: normal sinus rhythm  Lab Results:   Recent Labs  12/23/13 0355 12/24/13 0409  NA 139 139  K 3.5* 4.6  CL 101 100  CO2 27 24  GLUCOSE 105* 195*  BUN 11 17  CREATININE 0.75 0.66   CBC Latest Ref Rng 12/24/2013  12/23/2013 12/22/2013  WBC 4.0 - 10.5 K/uL 26.6(H) 15.8(H) 18.8(H)  Hemoglobin 13.0 - 17.0 g/dL 14.5 13.4 12.2(L)  Hematocrit 39.0 - 52.0 % 42.1 39.9 36.4(L)  Platelets 150 - 400 K/uL 264 211 196   BNP (last 3 results) No results found for this basename: PROBNP,  in the last 8760 hours  No results found for this basename: TROPONINI, CK, MB,  in the last 72 hours  Hepatic Function Panel  Recent Labs  12/24/13 0409  PROT 6.6  ALBUMIN 2.5*  AST 31  ALT 73*  ALKPHOS 82  BILITOT 0.5   No results found for this basename: INR,  in the last 72 hours BNP (last 3 results) No results found for this basename: PROBNP,  in the last 8760 hours  Lipid Panel  No results found for this basename: chol, trig, hdl, cholhdl, vldl, ldlcalc      Imaging:  Echo Study Conclusions  - Left ventricle: Poor endocardial definition Septal hypokinesis. The cavity size was normal. Wall thickness was normal. Systolic function was normal. The estimated ejection fraction was in the range of 50% to 55%. - Left atrium: The atrium was mildly dilated. - Atrial septum: No defect or patent foramen ovale was identified.    Assessment/Plan:   Principal Problem:   Rhabdomyolysis Active Problems:   Paresthesia of both lower extremities with weakness   Body aches  Chest pain   Transaminitis   Polysubstance abuse   Elevated troponin  In light of positive troponins and possible septal hypokinesis pt has been set up for cath this am. He admits to using heroin which had some cocaine in it prior to admission. No BB with recent cocaine exposure. Will check PA and Lat CXR today.    Troy Sine, MD, [facility] 12/24/2013, 7:37 AM

## 2013-12-24 NOTE — Progress Notes (Signed)
ANTIBIOTIC CONSULT NOTE - FOLLOW UP  Pharmacy Consult for Unasyn Indication: Aspiration pneumonia  No Known Allergies  Patient Measurements: Height: 5\' 10"  (177.8 cm) Weight: 221 lb 9 oz (100.5 kg) IBW/kg (Calculated) : 73  Vital Signs: Temp: 98.6 F (37 C) (08/10 0621) Temp src: Oral (08/10 0621) BP: 146/78 mmHg (08/10 0621) Pulse Rate: 56 (08/10 0621) Intake/Output from previous day: 08/09 0701 - 08/10 0700 In: 1501.8 [P.O.:1080; I.V.:121.8; IV Piggyback:300] Out: 4650 [Urine:4650] Intake/Output from this shift:    Labs:  Recent Labs  12/22/13 0420 12/23/13 0355 12/24/13 0409 12/24/13 0530  WBC 18.8* 15.8*  --  26.6*  HGB 12.2* 13.4  --  14.5  PLT 196 211  --  264  CREATININE 0.74 0.75 0.66  --    Estimated Creatinine Clearance: 145.8 ml/min (by C-G formula based on Cr of 0.66). No results found for this basename: VANCOTROUGH, Corlis Leak, VANCORANDOM, GENTTROUGH, GENTPEAK, GENTRANDOM, TOBRATROUGH, TOBRAPEAK, TOBRARND, AMIKACINPEAK, AMIKACINTROU, AMIKACIN,  in the last 72 hours   Microbiology: Recent Results (from the past 720 hour(s))  MRSA PCR SCREENING     Status: None   Collection Time    12/20/13  7:56 AM      Result Value Ref Range Status   MRSA by PCR NEGATIVE  NEGATIVE Final   Comment:            The GeneXpert MRSA Assay (FDA     approved for NASAL specimens     only), is one component of a     comprehensive MRSA colonization     surveillance program. It is not     intended to diagnose MRSA     infection nor to guide or     monitor treatment for     MRSA infections.  CULTURE, EXPECTORATED SPUTUM-ASSESSMENT     Status: None   Collection Time    12/21/13  9:15 AM      Result Value Ref Range Status   Specimen Description SPUTUM   Final   Special Requests NONE   Final   Sputum evaluation     Final   Value: MICROSCOPIC FINDINGS SUGGEST THAT THIS SPECIMEN IS NOT REPRESENTATIVE OF LOWER RESPIRATORY SECRETIONS. PLEASE RECOLLECT.     Gram Stain Report  Called to,Read Back By and Verified With: [place], M AT [phone] ON 12/21/13 BY HOBBINS, J.   Report Status 12/21/2013 FINAL   Final    Anti-infectives   Start     Dose/Rate Route Frequency Ordered Stop   12/20/13 0630  Ampicillin-Sulbactam (UNASYN) 3 g in sodium chloride 0.9 % 100 mL IVPB     3 g 100 mL/hr over 60 Minutes Intravenous 4 times per day 12/20/13 0622     12/20/13 0415  clindamycin (CLEOCIN) IVPB 900 mg     900 mg 100 mL/hr over 30 Minutes Intravenous  Once 12/20/13 0401 12/20/13 0455   12/20/13 0400  cefTRIAXone (ROCEPHIN) 1 g in dextrose 5 % 50 mL IVPB     1 g 100 mL/hr over 30 Minutes Intravenous  Once 12/20/13 0353 12/20/13 0455   12/20/13 0400  azithromycin (ZITHROMAX) 500 mg in dextrose 5 % 250 mL IVPB     500 mg 250 mL/hr over 60 Minutes Intravenous  Once 12/20/13 0353 12/20/13 0546      Assessment: 89 YOM found unresponsive a couple days ago following heroin use and has remained weak since (unable to get out of bed). Starting Unasyn for possible aspiration pneumonia  Day #5 ABX 8/6 /2015>>  Unasyn >>  Tmax: afeb WBCs: rising on solumedrol, 26k Renal: wnl/stable, CrCl > 100  8/7 sputum: not representative, suggest recollect  Goal of Therapy:  Eradication of infection Unasyn dose per renal function  Plan:   Continue Unasyn 3g IV q6h Follow up renal function & cultures  Peggyann Juba, PharmD, BCPS Pager: [phone] 12/24/2013,9:53 AM

## 2013-12-24 NOTE — CV Procedure (Signed)
      Cardiac Catheterization Operative Report  Kent Riendeau [id number] 8/10/20151:30 PM No PCP Per Patient  Procedure Performed:  1. Left Heart Catheterization 2. Selective Coronary Angiography 3. Left ventricular angiogram  Operator: Lauree Chandler, MD  Arterial access site:  Right radial artery.   Indication:  43 yo male with history of substance abuse admitted after he was found non-responsive. Labs c/w rhabdomyolysis. Troponin elevated.                                   Procedure Details: The risks, benefits, complications, treatment options, and expected outcomes were discussed with the patient. The patient and/or family concurred with the proposed plan, giving informed consent. The patient was brought to the cath lab after IV hydration was begun and oral premedication was given. The patient was further sedated with Versed and Fentanyl. The right wrist was assessed with a reverse Allens test which was positive. The right wrist was prepped and draped in a sterile fashion. 1% lidocaine was used for local anesthesia. Using the modified Seldinger access technique, a 5 French sheath was placed in the right radial artery. 3 mg Verapamil was given through the sheath. 4500 units IV heparin was given. Standard diagnostic catheters were used to perform selective coronary angiography. A pigtail catheter was used to perform a left ventricular angiogram. The sheath was removed from the right radial artery and a Terumo hemostasis band was applied at the arteriotomy site on the right wrist.   There were no immediate complications. The patient was taken to the recovery area in stable condition.   Hemodynamic Findings: Central aortic pressure: 130/86 Left ventricular pressure: 126/2/9  Angiographic Findings:  Left main: No obstructive disease.   Left Anterior Descending Artery: Large caliber vessel that courses to the apex. There are several small caliber diagonal branches. No  obstructive disease.   Circumflex Artery: Large caliber vessel with large caliber intermediate branch. No obstructive disease.   Right Coronary Artery: Moderate caliber non-dominant vessel with no obstructive disease.   Left Ventricular Angiogram: LVEF=50-55%.   Impression: 1. No angiographic evidence of CAD 2. Normal LV systolic function  Recommendations: No further ischemic workup. OK to d/c from cardiac standpoint. He does not need cardiac follow up. Recommend cessation of illicit substances and tobacco.        Complications:  None. The patient tolerated the procedure well.

## 2013-12-24 NOTE — Progress Notes (Signed)
[facility] PROGRESS NOTE   Ralph Chavez RWE:[id number] DOB: [birth year] DOA: 12/20/2013 PCP: No PCP Per Patient  HPI/Subjective: Felt better this morning, less cough and minimal sputum, less wheezing. Has right-sided pleuritic chest pain, chest x-ray done and showed pneumonia/effusion involving the right lung base. Patient is getting cardiac catheterization today.  Assessment/Plan: Principal Problem:   Rhabdomyolysis Active Problems:   Paresthesia of both lower extremities with weakness   Body aches   Chest pain   Transaminitis   Polysubstance abuse   Elevated troponin   Elevated troponin Patient presented with troponin of 2.02, went up to 2.91, hemoglobin is trending down. Complaining about chest pain prior to presentation, he is chest pain-free. EKG did not show any evidence of cardiac ischemia. Patient currently on heparin drip, avoid beta blockers as he uses cocaine. Cardiology on board, 2-D echo showed ejection fraction of 45-50%. Per cardiology recommendation patient will have cardiac catheterization on Monday.  Fluid overload/mild pulmonary edema Continues to complain about shortness of breath, chest x-ray showed probable edema versus pneumonitis. This is likely from aggressive IV fluid hydration, IV fluids discontinued, repeat the IV Lasix today. Follow clinically, follow intake/output and daily weights, x-rays if needed.  Aspiration pneumonia  Patient is found to have right-sided pneumonia likely aspiration pneumonia.  Patient is on Unasyn, bronchodilators, mucolytics, oxygen as needed. Patient is on Solu-Medrol, I will discontinue that as no evidence of wheezing for now.  Rhabdomyolysis  The patient is presently complains of bilateral pain, paresthesia, rash, generalized weakness.  He is found to have elevated CK and LFT with AST to ALT ratio nearly 2 is to 1. Patient has CT of lumbar spine showed ileus or swelling, negative MRI of thoracic spine. Pulses  are palpable laterally, namely dorsalis pedis, no evidence of compartment syndrome. Has a lot of weakness, was not able to walk today, he fell while he was trying to go to the bathroom 8/7. Continue IV fluid hydration, continue close followup for the renal function.  Transaminitis  Patient denies any alcohol abuse.  Tylenol and ethanol level undetectable. Transaminitis could be secondary to muscular injury/rhabdomyolysis as no elevation in alkaline phosphatase or bilirubin. Check CMP in a.m.   Polysubstance abuse Patient complains of significant pain. Reported heroin and cocaine abuse.  He is tachycardic and hypertensive therefore I would treat him with low dose of oxycodone.   Code Status: Full code Family Communication: Plan discussed with the patient. Disposition Plan: Remains inpatient   Consultants:  Cardiology  Procedures:  None  Antibiotics:  Units in   Objective: Filed Vitals:   12/24/13 0621  BP: 146/78  Pulse: 56  Temp: 98.6 F (37 C)  Resp: 18    Intake/Output Summary (Last 24 hours) at 12/24/13 1035 Last data filed at 12/24/13 6195  Gross per 24 hour  Intake 1501.81 ml  Output   4225 ml  Net -2723.19 ml   Filed Weights   12/20/13 0001 12/23/13 0500 12/24/13 0621  Weight: 86.183 kg (190 lb) 100.426 kg (221 lb 6.4 oz) 100.5 kg (221 lb 9 oz)    Exam: General: Alert and awake, oriented x3, not in any acute distress. HEENT: anicteric sclera, pupils reactive to light and accommodation, EOMI CVS: S1-S2 clear, no murmur rubs or gallops Chest: clear to auscultation bilaterally, no wheezing, rales or rhonchi Abdomen: soft nontender, nondistended, normal bowel sounds, no organomegaly Extremities: no cyanosis, clubbing or edema noted bilaterally Neuro: Cranial nerves II-XII intact, no focal neurological deficits  Data Reviewed: Basic Metabolic Panel:  Recent Labs Lab 12/20/13 0119 12/21/13 0344 12/22/13 0420 12/23/13 0355 12/24/13 0409  NA  133* 139 140 139 139  K 3.6* 3.4* 3.5* 3.5* 4.6  CL 94* 103 106 101 100  CO2 23 25 22 27 24   GLUCOSE 108* 170* 156* 105* 195*  BUN 21 10 12 11 17   CREATININE 1.05 0.68 0.74 0.75 0.66  CALCIUM 8.7 8.8 9.1 8.9 9.4   Liver Function Tests:  Recent Labs Lab 12/20/13 0119 12/21/13 0344 12/22/13 0420 12/23/13 0355 12/24/13 0409  AST 215* 89* 62* 40* 31  ALT 144* 96* 92* 82* 73*  ALKPHOS 89 63 79 71 82  BILITOT 0.6 0.4 0.3 0.6 0.5  PROT 6.6 5.9* 5.8* 5.8* 6.6  ALBUMIN 3.0* 2.5* 2.6* 2.5* 2.5*    Recent Labs Lab 12/20/13 0119  LIPASE 21   No results found for this basename: AMMONIA,  in the last 168 hours CBC:  Recent Labs Lab 12/20/13 0119 12/21/13 0344 12/22/13 0420 12/23/13 0355 12/24/13 0530  WBC 14.7* 15.3* 18.8* 15.8* 26.6*  NEUTROABS 12.0*  --   --   --   --   HGB 15.2 12.2* 12.2* 13.4 14.5  HCT 43.7 35.2* 36.4* 39.9 42.1  MCV 90.9 89.8 91.9 91.5 89.0  PLT PLATELET CLUMPS NOTED ON SMEAR, COUNT APPEARS ADEQUATE 155 196 211 264   Cardiac Enzymes:  Recent Labs Lab 12/20/13 0119 12/20/13 0755 12/20/13 1450 12/20/13 2115  CKTOTAL 7082*  --   --   --   TROPONINI  --  2.91* 1.53* 1.43*   BNP (last 3 results) No results found for this basename: PROBNP,  in the last 8760 hours CBG: No results found for this basename: GLUCAP,  in the last 168 hours  Micro Recent Results (from the past 240 hour(s))  MRSA PCR SCREENING     Status: None   Collection Time    12/20/13  7:56 AM      Result Value Ref Range Status   MRSA by PCR NEGATIVE  NEGATIVE Final   Comment:            The GeneXpert MRSA Assay (FDA     approved for NASAL specimens     only), is one component of a     comprehensive MRSA colonization     surveillance program. It is not     intended to diagnose MRSA     infection nor to guide or     monitor treatment for     MRSA infections.  CULTURE, EXPECTORATED SPUTUM-ASSESSMENT     Status: None   Collection Time    12/21/13  9:15 AM      Result  Value Ref Range Status   Specimen Description SPUTUM   Final   Special Requests NONE   Final   Sputum evaluation     Final   Value: MICROSCOPIC FINDINGS SUGGEST THAT THIS SPECIMEN IS NOT REPRESENTATIVE OF LOWER RESPIRATORY SECRETIONS. PLEASE RECOLLECT.     Gram Stain Report Called to,Read Back By and Verified With: [place], M AT 0942 ON 12/21/13 BY HOBBINS, J.   Report Status 12/21/2013 FINAL   Final     Studies: Dg Chest 2 View  12/24/2013   CLINICAL DATA:  Evaluate for a rib fracture. Right chest pain and shortness of breath.  EXAM: CHEST  2 VIEW  COMPARISON:  12/21/2013  FINDINGS: Two views of the chest were obtained. Increased densities at the right lung base are compatible with airspace disease and pleural fluid. No  evidence for a pneumothorax. Few densities at the left lung base but no significant airspace disease on the left side. There is fullness in the right hilar region which could be related to atelectasis or pleural fluid. Heart size is stable. Bony thorax is intact. Please note that dedicated rib images were not obtained.  IMPRESSION: Increased densities at the right lung base are related to airspace disease and pleural fluid.   Electronically Signed   By: Markus Daft M.D.   On: 12/24/2013 08:39    Scheduled Meds: . amLODipine  5 mg Oral Daily  . ampicillin-sulbactam (UNASYN) IV  3 g Intravenous 4 times per day  . aspirin  81 mg Oral Pre-Cath  . furosemide  40 mg Intravenous Once  . guaiFENesin  1,200 mg Oral BID  . losartan  25 mg Oral Daily  . methylPREDNISolone (SOLU-MEDROL) injection  80 mg Intravenous 3 times per day  . nicotine  21 mg Transdermal Daily  . pantoprazole  40 mg Oral Daily  . sodium chloride  3 mL Intravenous Q12H  . sodium chloride  3 mL Intravenous Q12H   Continuous Infusions: . sodium chloride 1 mL/kg/hr (12/24/13 0439)       Time spent: 35 minutes    [facility]  [facility] Pager [phone] If 7PM-7AM, please contact night-coverage at  [email or web address], password [facility] 12/24/2013, 10:35 AM  LOS: 4 days

## 2013-12-24 NOTE — Progress Notes (Signed)
Radial site intact.  Pt denies any discomfort at this time.  Pt ambulated to bathroom and was able to void with any difficulty.  Pt up in chair waiting for transport back to [facility] .

## 2013-12-24 NOTE — Progress Notes (Signed)
Pt transferred to Cath lab via stretcher.

## 2013-12-24 NOTE — Discharge Instructions (Signed)
Radial Site Care Refer to this sheet in the next few weeks. These instructions provide you with information on caring for yourself after your procedure. Your caregiver may also give you more specific instructions. Your treatment has been planned according to current medical practices, but problems sometimes occur. Call your caregiver if you have any problems or questions after your procedure. HOME CARE INSTRUCTIONS  You may shower the day after the procedure.Remove the bandage (dressing) and gently wash the site with plain soap and water.Gently pat the site dry.  Do not apply powder or lotion to the site.  Do not submerge the affected site in water for 3 to 5 days.  Inspect the site at least twice daily.  Do not flex or bend the affected arm for 24 hours.  No lifting over 5 pounds (2.3 kg) for 5 days after your procedure.  Do not drive home if you are discharged the same day of the procedure. Have someone else drive you.  You may drive 24 hours after the procedure unless otherwise instructed by your caregiver.  Do not operate machinery or power tools for 24 hours.  A responsible adult should be with you for the first 24 hours after you arrive home. What to expect:  Any bruising will usually fade within 1 to 2 weeks.  Blood that collects in the tissue (hematoma) may be painful to the touch. It should usually decrease in size and tenderness within 1 to 2 weeks. SEEK IMMEDIATE MEDICAL CARE IF:  You have unusual pain at the radial site.  You have redness, warmth, swelling, or pain at the radial site.  You have drainage (other than a small amount of blood on the dressing).  You have chills.  You have a fever or persistent symptoms for more than 72 hours.  You have a fever and your symptoms suddenly get worse.  Your arm becomes pale, cool, tingly, or numb.  You have heavy bleeding from the site. Hold pressure on the site. Call 911 is bleeding is not under control Document  Released: 06/05/2010 Document Revised: 07/26/2011 Document Reviewed: 06/05/2010 [facility] Patient Information 2015 [place]. This information is not intended to replace advice given to you by your health care provider. Make sure you discuss any questions you have with your health care provider.

## 2013-12-24 NOTE — Interval H&P Note (Signed)
History and Physical Interval Note:  12/24/2013 1:03 PM  Ralph Chavez  has presented today for cardiac cath with the diagnosis of chest pain/NSTEMI.  The various methods of treatment have been discussed with the patient and family. After consideration of risks, benefits and other options for treatment, the patient has consented to  Procedure(s): LEFT HEART CATHETERIZATION WITH CORONARY ANGIOGRAM (N/A) as a surgical intervention .  The patient's history has been reviewed, patient examined, no change in status, stable for surgery.  I have reviewed the patient's chart and labs.  Questions were answered to the patient's satisfaction.    Cath Lab Visit (complete for each Cath Lab visit)  Clinical Evaluation Leading to the Procedure:   ACS: Yes.    Non-ACS:    Anginal Classification: CCS III  Anti-ischemic medical therapy: No Therapy  Non-Invasive Test Results: No non-invasive testing performed  Prior CABG: No previous CABG        MCALHANY,CHRISTOPHER

## 2013-12-24 NOTE — Progress Notes (Signed)
Subjective:  Pt has some chest wall discomfort. He thinks that with his coughing he may have cracked a rib.  Objective:   Vital Signs in the last 24 hours: Temp:  [98 F (36.7 C)-98.6 F (37 C)] 98.6 F (37 C) (08/10 0621) Pulse Rate:  [56-90] 56 (08/10 0621) Resp:  [18] 18 (08/10 0621) BP: (146-162)/(78-97) 146/78 mmHg (08/10 0621) SpO2:  [96 %-100 %] 100 % (08/10 0621) Weight:  [221 lb 9 oz (100.5 kg)] 221 lb 9 oz (100.5 kg) (08/10 0621)  Intake/Output from previous day: 08/09 0701 - 08/10 0700 In: 1501.8 [P.O.:1080; I.V.:121.8; IV Piggyback:300] Out: 4650 [Urine:4650]  I/O since admission: +3306  Medications: . amLODipine  5 mg Oral Daily  . ampicillin-sulbactam (UNASYN) IV  3 g Intravenous 4 times per day  . aspirin  81 mg Oral Pre-Cath  . guaiFENesin  1,200 mg Oral BID  . losartan  25 mg Oral Daily  . methylPREDNISolone (SOLU-MEDROL) injection  80 mg Intravenous 3 times per day  . nicotine  21 mg Transdermal Daily  . pantoprazole  40 mg Oral Daily  . sodium chloride  3 mL Intravenous Q12H  . sodium chloride  3 mL Intravenous Q12H    . sodium chloride 1 mL/kg/hr (12/24/13 0439)    Physical Exam:   General appearance: alert, combative and no distress; became distressed when told he could not eat and only have liquids this am in anticipation of cath later today Neck: no adenopathy, no carotid bruit, no JVD, supple, symmetrical, trachea midline and thyroid not enlarged, symmetric, no tenderness/mass/nodules Lungs: clear to auscultation bilaterally Heart: regular rate and rhythm Abdomen: soft, non-tender; bowel sounds normal; no masses,  no organomegaly Extremities: no edema, redness or tenderness in the calves or thighs   Rate: 60  Rhythm: normal sinus rhythm  Lab Results:   Recent Labs  12/23/13 0355 12/24/13 0409  NA 139 139  K 3.5* 4.6  CL 101 100  CO2 27 24  GLUCOSE 105* 195*  BUN 11 17  CREATININE 0.75 0.66   CBC Latest Ref Rng 12/24/2013  12/23/2013 12/22/2013  WBC 4.0 - 10.5 K/uL 26.6(H) 15.8(H) 18.8(H)  Hemoglobin 13.0 - 17.0 g/dL 14.5 13.4 12.2(L)  Hematocrit 39.0 - 52.0 % 42.1 39.9 36.4(L)  Platelets 150 - 400 K/uL 264 211 196   BNP (last 3 results) No results found for this basename: PROBNP,  in the last 8760 hours  No results found for this basename: TROPONINI, CK, MB,  in the last 72 hours  Hepatic Function Panel  Recent Labs  12/24/13 0409  PROT 6.6  ALBUMIN 2.5*  AST 31  ALT 73*  ALKPHOS 82  BILITOT 0.5   No results found for this basename: INR,  in the last 72 hours BNP (last 3 results) No results found for this basename: PROBNP,  in the last 8760 hours  Lipid Panel  No results found for this basename: chol, trig, hdl, cholhdl, vldl, ldlcalc      Imaging:  Echo Study Conclusions  - Left ventricle: Poor endocardial definition Septal hypokinesis. The cavity size was normal. Wall thickness was normal. Systolic function was normal. The estimated ejection fraction was in the range of 50% to 55%. - Left atrium: The atrium was mildly dilated. - Atrial septum: No defect or patent foramen ovale was identified.    Assessment/Plan:   Principal Problem:   Rhabdomyolysis Active Problems:   Paresthesia of both lower extremities with weakness   Body aches  Chest pain   Transaminitis   Polysubstance abuse   Elevated troponin  In light of positive troponins and possible septal hypokinesis pt has been set up for cath this am. He admits to using heroin which had some cocaine in it prior to admission. No BB with recent cocaine exposure. Will check PA and Lat CXR today.    Troy Sine, MD, [facility] 12/24/2013, 7:37 AM

## 2013-12-25 LAB — COMPREHENSIVE METABOLIC PANEL
ALBUMIN: 2.3 g/dL — AB (ref 3.5–5.2)
ALT: 70 U/L — ABNORMAL HIGH (ref 0–53)
ANION GAP: 9 (ref 5–15)
AST: 37 U/L (ref 0–37)
Alkaline Phosphatase: 77 U/L (ref 39–117)
BUN: 16 mg/dL (ref 6–23)
CO2: 27 meq/L (ref 19–32)
CREATININE: 0.71 mg/dL (ref 0.50–1.35)
Calcium: 9.1 mg/dL (ref 8.4–10.5)
Chloride: 102 mEq/L (ref 96–112)
GFR calc non Af Amer: >90 mL/min (ref >90)
Glucose, Bld: 150 mg/dL — ABNORMAL HIGH (ref 70–99)
Potassium: 4.1 mEq/L (ref 3.7–5.3)
Sodium: 138 mEq/L (ref 137–147)
Total Bilirubin: 0.4 mg/dL (ref 0.3–1.2)
Total Protein: 6.1 g/dL (ref 6.0–8.3)

## 2013-12-25 LAB — CBC
HEMATOCRIT: 39.6 % (ref 39.0–52.0)
Hemoglobin: 13.5 g/dL (ref 13.0–17.0)
MCH: 30.8 pg (ref 26.0–34.0)
MCHC: 34.1 g/dL (ref 30.0–36.0)
MCV: 90.4 fL (ref 78.0–100.0)
Platelets: 307 10*3/uL (ref 150–400)
RBC: 4.38 MIL/uL (ref 4.22–5.81)
RDW: 14.9 % (ref 11.5–15.5)
WBC: 27.2 10*3/uL — ABNORMAL HIGH (ref 4.0–10.5)

## 2013-12-25 MED ORDER — AMOXICILLIN-POT CLAVULANATE 875-125 MG PO TABS: 1.0000 | ORAL_TABLET | Freq: Two times a day (BID) | ORAL | Status: DC

## 2013-12-25 MED ORDER — OXYCODONE-ACETAMINOPHEN 5-325 MG PO TABS: 1.0000 | ORAL_TABLET | Freq: Four times a day (QID) | ORAL | Status: DC | PRN

## 2013-12-25 MED ORDER — GUAIFENESIN ER 600 MG PO TB12: 1200.0000 mg | ORAL_TABLET | Freq: Two times a day (BID) | ORAL | Status: DC

## 2013-12-25 MED ORDER — AMLODIPINE BESYLATE 5 MG PO TABS: 5.0000 mg | ORAL_TABLET | Freq: Every day | ORAL | Status: DC

## 2013-12-25 NOTE — Discharge Summary (Signed)
Physician Discharge Summary  Ralph Chavez XBD:[id number] DOB: 06/10/1970 DOA: 12/20/2013  PCP: No PCP Per Patient  Admit date: 12/20/2013 Discharge date: 12/25/2013  Time spent: 40 minutes  Recommendations for Outpatient Follow-up:  1. Followup with primary care physician within one week. 2. CMP to look for LFTs in one week.  Discharge Diagnoses:  Principal Problem:   Rhabdomyolysis Active Problems:   Paresthesia of both lower extremities with weakness   Body aches   Chest pain   Transaminitis   Polysubstance abuse   Elevated troponin   Syncope   Discharge Condition: Stable  Diet recommendation: Regular diet  Filed Weights   12/23/13 0500 12/24/13 0621 12/25/13 0536  Weight: 100.426 kg (221 lb 6.4 oz) 100.5 kg (221 lb 9 oz) 100.064 kg (220 lb 9.6 oz)    History of present illness:  Ralph Chavez is a 43 y.o. male with Past medical history of polysubstance abuse.  The patient presented with complaints of a fall and generalized body pain. He is significantly poor historian and is not providing much information on history and talking with verbal abuse.  He mentions that he used heroin for the first time on Monday and may have a fall. He does not remember how the fall happened he does not remember any information related to fall.  Tuesday sometime he was found by a friend lying in his bed unresponsive. He has weakness in both legs throughout the Tuesday and Wednesday with numbness and tingling along with weakness. He also has some numbness on his left hand at the ulnar border. He could not walk or place any weight on his legs since Tuesday and has been lying down in the bed. Due to worsening pain and weakness EMS was called and the patient was brought here.  He denies any other drug abuse but on questioning about also do cocaine in his urine he mentions he has used it once long time ago. He denies abusing any other opioids. He denies any prior use of opioids. He mentions he used  heroine via snorting.  He denies any dizziness or lightheadedness. He denies any headache. He feels more no fever no chills. He complains of some pain on his chest more on the right side. Worsening with deep breathing.  He complains of pain in his abdomen, on his right side of the chest, bilateral flank, which is sharp pain all over. He also complains of pain in bilateral thigh, worsening with flexion, bilateral legs, and foot.  He never had any rash before.  The patient is coming from home. And at his baseline independent for most of his ADL.   Hospital Course:   Elevated troponin  Patient presented with troponin of 2.02, went up to 2.91, hemoglobin is trending down.  Complaining about chest pain prior to presentation, he is chest pain-free.  EKG did not show any evidence of cardiac ischemia.  Started initially on heparin drip but discontinued after cardiology recommends so. Cardiology on board, 2-D echo showed ejection fraction of 45-50%.  Cardiac catheterization done on 8/10 showed clean coronaries, cleared for discharge by cardiology.  Fluid overload/mild pulmonary edema  Continues to complain about shortness of breath, chest x-ray showed probable edema versus pneumonitis.  This is likely from aggressive IV fluid hydration, IV fluids discontinued, repeat the IV Lasix today.  Does not have CHF as his LVEF looks okay, probably this is secondary to aggressive IV fluid infusion. Resolved.  Aspiration pneumonia  Patient is found to have right-sided pneumonia likely  aspiration pneumonia.  Patient is on Unasyn, bronchodilators, mucolytics, oxygen as needed.  On discharge patient discharged on Augmentin for 5 more days, Mucinex.  Rhabdomyolysis  The patient is presently complains of bilateral pain, paresthesia, rash, generalized weakness.  He is found to have elevated CK and LFT with AST to ALT ratio nearly 2 is to 1.  Patient has CT of lumbar spine showed ileus or swelling, negative MRI of  thoracic spine.  Pulses are palpable laterally, namely dorsalis pedis, no evidence of compartment syndrome.  Has a lot of weakness, was not able to walk today, he fell while he was trying to go to the bathroom 8/7.  Continue IV fluid hydration, continue close followup for the renal function.   Transaminitis  Patient denies any alcohol abuse.  Tylenol and ethanol level undetectable.  Transaminitis could be secondary to muscular injury/rhabdomyolysis as no elevation in alkaline phosphatase or bilirubin.  Check CMP in a.m.   Polysubstance abuse  Patient complains of significant pain. Reported heroin and cocaine abuse.  He is tachycardic and hypertensive, so he was started on low dose of oxycodone. At the time of discharge Percocet was given, 20 pills, patient to take as needed.  Left knee pain Patient was complaining about a lot of pain in the left knee after fall, x-ray did not show any fracture. Patient discharged on Percocet 5/325 for pain, only 20 pills given.  Lower extremities weakness At the time of admission patient was mentioning lower extremities weakness, frequent falls and paresthesias. His thoracic spine was imaged by MRI, showed no acute events. Patient was walking with minimal help, his fall is likely secondary to pain and cannot bear full weight on his left knee after he fell on it.   Procedures:  Cardiac cath done on 8/10 by Dr. Angelena Form showed clean coronaries  Consultations:  Cardiology  Discharge Exam: Filed Vitals:   12/25/13 0536  BP: 159/88  Pulse: 87  Temp: 98.1 F (36.7 C)  Resp: 22   General: Alert and awake, oriented x3, not in any acute distress. HEENT: anicteric sclera, pupils reactive to light and accommodation, EOMI CVS: S1-S2 clear, no murmur rubs or gallops Chest: clear to auscultation bilaterally, no wheezing, rales or rhonchi Abdomen: soft nontender, nondistended, normal bowel sounds, no organomegaly Extremities: no cyanosis, clubbing or  edema noted bilaterally Neuro: Cranial nerves II-XII intact, no focal neurological deficits  Discharge Instructions You were cared for by a hospitalist during your hospital stay. If you have any questions about your discharge medications or the care you received while you were in the hospital after you are discharged, you can call the unit and asked to speak with the hospitalist on call if the hospitalist that took care of you is not available. Once you are discharged, your primary care physician will handle any further medical issues. Please note that NO REFILLS for any discharge medications will be authorized once you are discharged, as it is imperative that you return to your primary care physician (or establish a relationship with a primary care physician if you do not have one) for your aftercare needs so that they can reassess your need for medications and monitor your lab values.  Discharge Instructions   Increase activity slowly    Complete by:  As directed             Medication List         amLODipine 5 MG tablet  Commonly known as:  NORVASC  Take 1 tablet (5  mg total) by mouth daily.     amoxicillin-clavulanate 875-125 MG per tablet  Commonly known as:  AUGMENTIN  Take 1 tablet by mouth 2 (two) times daily.     guaiFENesin 600 MG 12 hr tablet  Commonly known as:  MUCINEX  Take 2 tablets (1,200 mg total) by mouth 2 (two) times daily.     omeprazole 20 MG capsule  Commonly known as:  PRILOSEC  Take 20 mg by mouth daily.     oxyCODONE-acetaminophen 5-325 MG per tablet  Commonly known as:  ROXICET  Take 1 tablet by mouth every 6 (six) hours as needed.       No Known Allergies     Follow-up Information   Follow up with [facility]    . (Walk in m-f 10a-6p/$20 co pay/photo id/medicines in bottle)    Contact information:   [place] [id number] [phone]       The results of significant diagnostics from this  hospitalization (including imaging, microbiology, ancillary and laboratory) are listed below for reference.    Significant Diagnostic Studies: Dg Chest 2 View  12/24/2013   CLINICAL DATA:  Evaluate for a rib fracture. Right chest pain and shortness of breath.  EXAM: CHEST  2 VIEW  COMPARISON:  12/21/2013  FINDINGS: Two views of the chest were obtained. Increased densities at the right lung base are compatible with airspace disease and pleural fluid. No evidence for a pneumothorax. Few densities at the left lung base but no significant airspace disease on the left side. There is fullness in the right hilar region which could be related to atelectasis or pleural fluid. Heart size is stable. Bony thorax is intact. Please note that dedicated rib images were not obtained.  IMPRESSION: Increased densities at the right lung base are related to airspace disease and pleural fluid.   Electronically Signed   By: Markus Daft M.D.   On: 12/24/2013 08:39   Dg Chest 2 View  12/21/2013   CLINICAL DATA:  Shortness of breath, cough, smoker  EXAM: CHEST  2 VIEW  COMPARISON:  12/20/2013  FINDINGS: Cardiomediastinal silhouette is stable. Worsening central mild interstitial prominence suspicious for mild edema or pneumonitis. No segmental infiltrate.  IMPRESSION: Worsening central mild interstitial prominence suspicious for edema or pneumonitis. No segmental infiltrate.   Electronically Signed   By: Lahoma Crocker M.D.   On: 12/21/2013 14:57   Dg Chest 2 View  12/20/2013   CLINICAL DATA:  Chest pain, shortness of breath.  EXAM: CHEST  2 VIEW  COMPARISON:  CT of the thoracic spine December 20, 2013  FINDINGS: The cardiac silhouette appears moderately enlarged, mediastinal silhouette is nonsuspicious. Central pulmonary vascular congestion, mild interstitial prominence. Minimal bibasilar strandy densities, with left midlung zone airspace opacity. No pneumothorax. Soft tissue planes and included osseous structures are nonsuspicious.   IMPRESSION: Moderate cardiomegaly, interstitial prominence may reflect pulmonary edema with left midlung zone airspace opacity which could reflect confluent edema or even pneumonia. Right lung base probable atelectasis.   Electronically Signed   By: Elon Alas   On: 12/20/2013 03:20   Ct Head Wo Contrast  12/20/2013   CLINICAL DATA:  Fall with weakness and pain.  EXAM: CT HEAD WITHOUT CONTRAST  CT CERVICAL SPINE WITHOUT CONTRAST  TECHNIQUE: Multidetector CT imaging of the head and cervical spine was performed following the standard protocol without intravenous contrast. Multiplanar CT image reconstructions of the cervical spine were also generated.  COMPARISON:  None.  FINDINGS: CT HEAD FINDINGS  Skull and Sinuses:Inflammatory mucosal thickening in the paranasal sinuses. No sinus effusion. 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 There is diffuse edema of the scalp. No calvarial fracture.  Orbits: No acute abnormality.  Brain: No evidence of acute abnormality, such as acute infarction, hemorrhage, hydrocephalus, or mass lesion/mass effect.  CT CERVICAL SPINE FINDINGS  Negative for acute fracture or traumatic subluxation. No prevertebral edema. No gross cervical canal hematoma. Facet degeneration, most notable at C3-4 where there is left foraminal stenosis secondary to spurring.  IMPRESSION: No evidence of intracranial abnormality or cervical spine injury.   Electronically Signed   By: Jorje Guild M.D.   On: 12/20/2013 02:28   Ct Cervical Spine Wo Contrast  12/20/2013   CLINICAL DATA:  Fall with weakness and pain.  EXAM: CT HEAD WITHOUT CONTRAST  CT CERVICAL SPINE WITHOUT CONTRAST  TECHNIQUE: Multidetector CT imaging of the head and cervical spine was performed following the standard protocol without intravenous contrast. Multiplanar CT image reconstructions of the cervical spine were also generated.  COMPARISON:  None.  FINDINGS: CT HEAD FINDINGS  Skull and Sinuses:Inflammatory mucosal  thickening in the paranasal sinuses. No sinus effusion. 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 There is diffuse edema of the scalp. No calvarial fracture.  Orbits: No acute abnormality.  Brain: No evidence of acute abnormality, such as acute infarction, hemorrhage, hydrocephalus, or mass lesion/mass effect.  CT CERVICAL SPINE FINDINGS  Negative for acute fracture or traumatic subluxation. No prevertebral edema. No gross cervical canal hematoma. Facet degeneration, most notable at C3-4 where there is left foraminal stenosis secondary to spurring.  IMPRESSION: No evidence of intracranial abnormality or cervical spine injury.   Electronically Signed   By: Jorje Guild M.D.   On: 12/20/2013 02:28   Ct Thoracic Spine Wo Contrast  12/20/2013   CLINICAL DATA:  Weakness and pain after fall.  EXAM: CT THORACIC AND LUMBAR SPINE WITHOUT CONTRAST  TECHNIQUE: Multidetector CT imaging of the thoracic and lumbar spine was performed without contrast. Multiplanar CT image reconstructions were also generated.  COMPARISON:  None.  FINDINGS: CT THORACIC SPINE FINDINGS  No acute fracture subluxation. No osseous canal or foraminal stenosis. Limited visualization of the canal, no gross intrathecal abnormality.  Dense airspace disease in the right lower lobe. There is patchy airspace opacity in the right upper lobe.  CT LUMBAR SPINE FINDINGS  No acute fracture or traumatic subluxation. There is endplate irregularities at the lower thoracic and upper lumbar level without focal disc narrowing. No visible disc herniation or significant canal/ foraminal stenosis. Partially visualized edema along the mildly expanded left iliopsoas.  IMPRESSION: CT THORACIC SPINE IMPRESSION  *Negative thoracic spine. *Right lower lobe aspiration or pneumonia.  CT LUMBAR SPINE IMPRESSION  *No acute osseous findings. *Edema around the partially visualized left iliopsoas, with muscle in expansion, suggesting strain.   Electronically Signed   By:  Jorje Guild M.D.   On: 12/20/2013 02:35   Ct Lumbar Spine Wo Contrast  12/20/2013   CLINICAL DATA:  Weakness and pain after fall.  EXAM: CT THORACIC AND LUMBAR SPINE WITHOUT CONTRAST  TECHNIQUE: Multidetector CT imaging of the thoracic and lumbar spine was performed without contrast. Multiplanar CT image reconstructions were also generated.  COMPARISON:  None.  FINDINGS: CT THORACIC SPINE FINDINGS  No acute fracture subluxation. No osseous canal or foraminal stenosis. Limited visualization of the canal, no gross intrathecal abnormality.  Dense airspace disease in the right lower lobe.  There is patchy airspace opacity in the right upper lobe.  CT LUMBAR SPINE FINDINGS  No acute fracture or traumatic subluxation. There is endplate irregularities at the lower thoracic and upper lumbar level without focal disc narrowing. No visible disc herniation or significant canal/ foraminal stenosis. Partially visualized edema along the mildly expanded left iliopsoas.  IMPRESSION: CT THORACIC SPINE IMPRESSION  *Negative thoracic spine. *Right lower lobe aspiration or pneumonia.  CT LUMBAR SPINE IMPRESSION  *No acute osseous findings. *Edema around the partially visualized left iliopsoas, with muscle in expansion, suggesting strain.   Electronically Signed   By: Jorje Guild M.D.   On: 12/20/2013 02:35   Mr Thoracic Spine Wo Contrast  12/20/2013   CLINICAL DATA:  43 year old male with acute onset inability to walk, bilateral paresthesia is, weakness. Initial encounter. Advises recent heroin use.  EXAM: MRI THORACIC SPINE WITHOUT CONTRAST  TECHNIQUE: Multiplanar, multisequence MR imaging of the thoracic spine was performed. No intravenous contrast was administered.  COMPARISON:  Chest radiographs and thoracic spine CT 0300 hr the same day.  FINDINGS: The examination had to be discontinued prior to completion due to patient request, with axial gradient echo imaging omitted.  Limited sagittal imaging of the cervical spine  appears grossly normal.  Thoracic vertebral height and alignment maintained, except for mild chronic superior endplate deformity at K93 which could be congenital or degenerative. No marrow edema or evidence of acute osseous abnormality.  Axial T2 weighted images are intermittently degraded by motion. There is a very small central disc protrusion at T8-T9. There is no thoracic spinal stenosis or cord compression. Spinal cord signal is within normal limits at all visualized levels.  Abnormal signal in the right lower lobe dependently (series 8, image 29). Patchy right perihilar increased signal (image 15).  Otherwise grossly negative visualized thoracic viscera. Visualized posterior paraspinal soft tissues are within normal limits.  IMPRESSION: 1. No acute or significant findings identified in the thoracic spine. Tiny T8-T9 disc protrusion without neural impingement. 2. Multifocal abnormal signal in the right lung, as seen on the earlier thoracic CT, and may reflect pneumonia or aspiration in this setting.   Electronically Signed   By: Lars Pinks M.D.   On: 12/20/2013 07:53   Dg Knee Complete 4 Views Left  12/20/2013   CLINICAL DATA:  Fall knee pain  EXAM: LEFT KNEE - COMPLETE 4+ VIEW  COMPARISON:  None  FINDINGS: Negative for fracture. Normal alignment. Anterior soft tissue swelling. No joint effusion. Joint spaces are maintained.  IMPRESSION: Anterior soft tissue swelling. Negative for fracture or dislocation.   Electronically Signed   By: Franchot Gallo M.D.   On: 12/20/2013 02:03    Microbiology: Recent Results (from the past 240 hour(s))  MRSA PCR SCREENING     Status: None   Collection Time    12/20/13  7:56 AM      Result Value Ref Range Status   MRSA by PCR NEGATIVE  NEGATIVE Final   Comment:            The GeneXpert MRSA Assay (FDA     approved for NASAL specimens     only), is one component of a     comprehensive MRSA colonization     surveillance program. It is not     intended to diagnose  MRSA     infection nor to guide or     monitor treatment for     MRSA infections.  CULTURE, EXPECTORATED SPUTUM-ASSESSMENT     Status:  None   Collection Time    12/21/13  9:15 AM      Result Value Ref Range Status   Specimen Description SPUTUM   Final   Special Requests NONE   Final   Sputum evaluation     Final   Value: MICROSCOPIC FINDINGS SUGGEST THAT THIS SPECIMEN IS NOT REPRESENTATIVE OF LOWER RESPIRATORY SECRETIONS. PLEASE RECOLLECT.     Gram Stain Report Called to,Read Back By and Verified With: Van Zandt, M AT 0942 ON 12/21/13 BY HOBBINS, J.   Report Status 12/21/2013 FINAL   Final     Labs: Basic Metabolic Panel:  Recent Labs Lab 12/21/13 0344 12/22/13 0420 12/23/13 0355 12/24/13 0409 12/25/13 0500  NA 139 140 139 139 138  K 3.4* 3.5* 3.5* 4.6 4.1  CL 103 106 101 100 102  CO2 25 22 27 24 27   GLUCOSE 170* 156* 105* 195* 150*  BUN 10 12 11 17 16   CREATININE 0.68 0.74 0.75 0.66 0.71  CALCIUM 8.8 9.1 8.9 9.4 9.1   Liver Function Tests:  Recent Labs Lab 12/21/13 0344 12/22/13 0420 12/23/13 0355 12/24/13 0409 12/25/13 0500  AST 89* 62* 40* 31 37  ALT 96* 92* 82* 73* 70*  ALKPHOS 63 79 71 82 77  BILITOT 0.4 0.3 0.6 0.5 0.4  PROT 5.9* 5.8* 5.8* 6.6 6.1  ALBUMIN 2.5* 2.6* 2.5* 2.5* 2.3*    Recent Labs Lab 12/20/13 0119  LIPASE 21   No results found for this basename: AMMONIA,  in the last 168 hours CBC:  Recent Labs Lab 12/20/13 0119 12/21/13 0344 12/22/13 0420 12/23/13 0355 12/24/13 0530 12/25/13 0500  WBC 14.7* 15.3* 18.8* 15.8* 26.6* 27.2*  NEUTROABS 12.0*  --   --   --   --   --   HGB 15.2 12.2* 12.2* 13.4 14.5 13.5  HCT 43.7 35.2* 36.4* 39.9 42.1 39.6  MCV 90.9 89.8 91.9 91.5 89.0 90.4  PLT PLATELET CLUMPS NOTED ON SMEAR, COUNT APPEARS ADEQUATE 155 196 211 264 307   Cardiac Enzymes:  Recent Labs Lab 12/20/13 0119 12/20/13 0755 12/20/13 1450 12/20/13 2115  CKTOTAL 7082*  --   --   --   TROPONINI  --  2.91* 1.53* 1.43*   BNP: BNP  (last 3 results)  Recent Labs  12/24/13 0409  PROBNP 538.5*   CBG: No results found for this basename: GLUCAP,  in the last 168 hours     Signed:  Ann Bohne A  [facility] 12/25/2013, 10:48 AM

## 2013-12-25 NOTE — Care Management Note (Signed)
    Page 1 of 2   12/25/2013     1:26:02 PM CARE MANAGEMENT NOTE 12/25/2013  Patient:  Ralph Chavez, Ralph Chavez   Account Number:  [id number]  Date Initiated:  12/20/2013  Documentation initiated by:  DAVIS,RHONDA  Subjective/Objective Assessment:   pt with multiple health problems -rhabo/pna/polysubstance abuse/main complaint that time of admission is generalized weakness and pain/     Action/Plan:   home when stable   Anticipated DC Date:  12/25/2013   Anticipated DC Plan:  HOME/SELF CARE  In-house referral  Clinical Social Worker  [place]  CM consult  Medication [facility]      [facility] Choice  NA   Choice offered to / List presented to:  NA   DME arranged  NA      DME agency  NA     [place] arranged  NA      [place] agency  NA   Status of service:  Completed, signed off Medicare Important Message given?  NA - LOS <3 / Initial given by admissions (If response is "NO", the following Medicare IM given date fields will be blank) Date Medicare IM given:   Medicare IM given by:   Date Additional Medicare IM given:   Additional Medicare IM given by:    Discharge Disposition:  HOME/SELF CARE  Per UR Regulation:  Reviewed for med. necessity/level of care/duration of stay  If discussed at [place] of Stay Meetings, dates discussed:   12/25/2013    Comments:  12/25/13 Kilie Rund RN,BSN NCM [place] F/U,PROVIDED W/DISCOUNT COUPONS FOR NORVASC,AUGMENTIN,& [place].PATIENT STATES ABLE TO AFFORD THESE MEDS,HAS GENERIC [place] @ HOME.DECLINES NEEDING MATCH PROGRAM.NO FURTHER D/C NEEDS.  [id number] Velva Harman, RN, BSN, CCM:  [phone] Chart reviewed.  No discharge needs present at time of this review. Financial counselor to see due to no active insurance.  May need scw due to polysubstance abuse. Next review of chart due on [id number].

## 2013-12-25 NOTE — Progress Notes (Signed)
Pt being non-compliant with our falls safety plan. Discussed safety plan with patient and he stated that he didn't care and that he was going to do what he wanted to do. Pt reminded that he has fallen twice this admission, but he was angry with staff and didn't care. Will continue to monitor for safety.   Othella Boyer [facility]

## 2013-12-31 ENCOUNTER — Inpatient Hospital Stay (HOSPITAL_COMMUNITY)
Admission: EM | Admit: 2013-12-31 | Discharge: 2014-01-12 | DRG: 871 | Discharge status: Against Medical Advice | Payer: Self-pay | Attending: Internal Medicine | Admitting: Internal Medicine

## 2013-12-31 ENCOUNTER — Encounter (HOSPITAL_COMMUNITY): Payer: Self-pay | Admitting: Emergency Medicine

## 2013-12-31 ENCOUNTER — Emergency Department (HOSPITAL_COMMUNITY): Payer: Self-pay

## 2013-12-31 ENCOUNTER — Inpatient Hospital Stay (HOSPITAL_COMMUNITY): Payer: Self-pay

## 2013-12-31 DIAGNOSIS — J90 Pleural effusion, not elsewhere classified: Secondary | ICD-10-CM | POA: Diagnosis present

## 2013-12-31 DIAGNOSIS — I498 Other specified cardiac arrhythmias: Secondary | ICD-10-CM | POA: Diagnosis not present

## 2013-12-31 DIAGNOSIS — I10 Essential (primary) hypertension: Secondary | ICD-10-CM | POA: Diagnosis not present

## 2013-12-31 DIAGNOSIS — J9819 Other pulmonary collapse: Secondary | ICD-10-CM | POA: Diagnosis present

## 2013-12-31 DIAGNOSIS — I252 Old myocardial infarction: Secondary | ICD-10-CM

## 2013-12-31 DIAGNOSIS — R5381 Other malaise: Secondary | ICD-10-CM | POA: Diagnosis present

## 2013-12-31 DIAGNOSIS — A419 Sepsis, unspecified organism: Principal | ICD-10-CM | POA: Diagnosis present

## 2013-12-31 DIAGNOSIS — F1910 Other psychoactive substance abuse, uncomplicated: Secondary | ICD-10-CM | POA: Diagnosis present

## 2013-12-31 DIAGNOSIS — K219 Gastro-esophageal reflux disease without esophagitis: Secondary | ICD-10-CM | POA: Diagnosis present

## 2013-12-31 DIAGNOSIS — F411 Generalized anxiety disorder: Secondary | ICD-10-CM | POA: Diagnosis present

## 2013-12-31 DIAGNOSIS — G589 Mononeuropathy, unspecified: Secondary | ICD-10-CM | POA: Diagnosis present

## 2013-12-31 DIAGNOSIS — J9600 Acute respiratory failure, unspecified whether with hypoxia or hypercapnia: Secondary | ICD-10-CM | POA: Diagnosis present

## 2013-12-31 DIAGNOSIS — Z720 Tobacco use: Secondary | ICD-10-CM

## 2013-12-31 DIAGNOSIS — D473 Essential (hemorrhagic) thrombocythemia: Secondary | ICD-10-CM | POA: Diagnosis present

## 2013-12-31 DIAGNOSIS — K7689 Other specified diseases of liver: Secondary | ICD-10-CM | POA: Diagnosis present

## 2013-12-31 DIAGNOSIS — J189 Pneumonia, unspecified organism: Secondary | ICD-10-CM | POA: Diagnosis present

## 2013-12-31 DIAGNOSIS — R202 Paresthesia of skin: Secondary | ICD-10-CM

## 2013-12-31 DIAGNOSIS — R209 Unspecified disturbances of skin sensation: Secondary | ICD-10-CM | POA: Diagnosis present

## 2013-12-31 DIAGNOSIS — R52 Pain, unspecified: Secondary | ICD-10-CM | POA: Diagnosis present

## 2013-12-31 DIAGNOSIS — D72829 Elevated white blood cell count, unspecified: Secondary | ICD-10-CM | POA: Diagnosis present

## 2013-12-31 DIAGNOSIS — K5900 Constipation, unspecified: Secondary | ICD-10-CM | POA: Diagnosis present

## 2013-12-31 DIAGNOSIS — R5383 Other fatigue: Secondary | ICD-10-CM

## 2013-12-31 DIAGNOSIS — E876 Hypokalemia: Secondary | ICD-10-CM | POA: Diagnosis present

## 2013-12-31 DIAGNOSIS — F1110 Opioid abuse, uncomplicated: Secondary | ICD-10-CM | POA: Diagnosis present

## 2013-12-31 DIAGNOSIS — J869 Pyothorax without fistula: Secondary | ICD-10-CM | POA: Diagnosis present

## 2013-12-31 DIAGNOSIS — Z79899 Other long term (current) drug therapy: Secondary | ICD-10-CM

## 2013-12-31 DIAGNOSIS — F1010 Alcohol abuse, uncomplicated: Secondary | ICD-10-CM | POA: Diagnosis present

## 2013-12-31 DIAGNOSIS — Z9181 History of falling: Secondary | ICD-10-CM

## 2013-12-31 DIAGNOSIS — F4322 Adjustment disorder with anxiety: Secondary | ICD-10-CM

## 2013-12-31 DIAGNOSIS — J9601 Acute respiratory failure with hypoxia: Secondary | ICD-10-CM

## 2013-12-31 DIAGNOSIS — F17200 Nicotine dependence, unspecified, uncomplicated: Secondary | ICD-10-CM | POA: Diagnosis present

## 2013-12-31 DIAGNOSIS — R079 Chest pain, unspecified: Secondary | ICD-10-CM | POA: Diagnosis present

## 2013-12-31 LAB — BASIC METABOLIC PANEL
Anion gap: 15 (ref 5–15)
BUN: 3 mg/dL — ABNORMAL LOW (ref 6–23)
CO2: 25 mEq/L (ref 19–32)
Calcium: 9 mg/dL (ref 8.4–10.5)
Chloride: 98 mEq/L (ref 96–112)
Creatinine, Ser: 0.66 mg/dL (ref 0.50–1.35)
GFR calc Af Amer: >90 mL/min (ref >90)
GFR calc non Af Amer: >90 mL/min (ref >90)
Glucose, Bld: 167 mg/dL — ABNORMAL HIGH (ref 70–99)
Potassium: 3.4 mEq/L — ABNORMAL LOW (ref 3.7–5.3)
Sodium: 138 mEq/L (ref 137–147)

## 2013-12-31 LAB — CBC WITH DIFFERENTIAL/PLATELET
Basophils Absolute: 0 10*3/uL (ref 0.0–0.1)
Basophils Relative: 0 % (ref 0–1)
Eosinophils Absolute: 0.3 10*3/uL (ref 0.0–0.7)
Eosinophils Relative: 1 % (ref 0–5)
HCT: 38.1 % — ABNORMAL LOW (ref 39.0–52.0)
Hemoglobin: 12.8 g/dL — ABNORMAL LOW (ref 13.0–17.0)
Lymphocytes Relative: 6 % — ABNORMAL LOW (ref 12–46)
Lymphs Abs: 1.3 10*3/uL (ref 0.7–4.0)
MCH: 31 pg (ref 26.0–34.0)
MCHC: 33.6 g/dL (ref 30.0–36.0)
MCV: 92.3 fL (ref 78.0–100.0)
Monocytes Absolute: 1.6 10*3/uL — ABNORMAL HIGH (ref 0.1–1.0)
Monocytes Relative: 8 % (ref 3–12)
Neutro Abs: 17.2 10*3/uL — ABNORMAL HIGH (ref 1.7–7.7)
Neutrophils Relative %: 85 % — ABNORMAL HIGH (ref 43–77)
Platelets: 457 10*3/uL — ABNORMAL HIGH (ref 150–400)
RBC: 4.13 MIL/uL — ABNORMAL LOW (ref 4.22–5.81)
RDW: 15.3 % (ref 11.5–15.5)
WBC: 20.3 10*3/uL — ABNORMAL HIGH (ref 4.0–10.5)

## 2013-12-31 LAB — URINALYSIS, ROUTINE W REFLEX MICROSCOPIC
Bilirubin Urine: NEGATIVE
Glucose, UA: NEGATIVE mg/dL
Ketones, ur: NEGATIVE mg/dL
Leukocytes, UA: NEGATIVE
Nitrite: NEGATIVE
Protein, ur: NEGATIVE mg/dL
Specific Gravity, Urine: 1.004 — ABNORMAL LOW (ref 1.005–1.030)
Urobilinogen, UA: 0.2 mg/dL (ref 0.0–1.0)
pH: 6.5 (ref 5.0–8.0)

## 2013-12-31 LAB — URINE MICROSCOPIC-ADD ON

## 2013-12-31 LAB — RAPID URINE DRUG SCREEN, HOSP PERFORMED
Amphetamines: NOT DETECTED
Barbiturates: NOT DETECTED
Benzodiazepines: NOT DETECTED
Cocaine: NOT DETECTED
Opiates: NOT DETECTED
Tetrahydrocannabinol: NOT DETECTED

## 2013-12-31 LAB — LACTIC ACID, PLASMA: Lactic Acid, Venous: 2.9 mmol/L — ABNORMAL HIGH (ref 0.5–2.2)

## 2013-12-31 LAB — CK: Total CK: 42 U/L (ref 7–232)

## 2013-12-31 MED ORDER — ENOXAPARIN SODIUM 40 MG/0.4ML ~~LOC~~ SOLN
40.0000 mg | SUBCUTANEOUS | Status: DC
  Administered 2013-12-31: 40 mg via SUBCUTANEOUS
  Filled 2013-12-31: qty 0.4

## 2013-12-31 MED ORDER — PNEUMOCOCCAL VAC POLYVALENT 25 MCG/0.5ML IJ INJ
0.5000 mL | INJECTION | INTRAMUSCULAR | Status: DC
  Filled 2013-12-31 (×4): qty 0.5

## 2013-12-31 MED ORDER — MORPHINE SULFATE 2 MG/ML IJ SOLN
2.0000 mg | INTRAMUSCULAR | Status: DC | PRN
  Administered 2014-01-01 (×2): 4 mg via INTRAVENOUS
  Administered 2014-01-01: 2 mg via INTRAVENOUS
  Administered 2014-01-02 (×3): 4 mg via INTRAVENOUS
  Filled 2013-12-31 (×2): qty 2
  Filled 2013-12-31: qty 1
  Filled 2013-12-31 (×4): qty 2

## 2013-12-31 MED ORDER — PIPERACILLIN-TAZOBACTAM 3.375 G IVPB 30 MIN
3.3750 g | Freq: Once | INTRAVENOUS | Status: AC
  Administered 2013-12-31: 3.375 g via INTRAVENOUS
  Filled 2013-12-31: qty 50

## 2013-12-31 MED ORDER — IOHEXOL 350 MG/ML SOLN
100.0000 mL | Freq: Once | INTRAVENOUS | Status: AC | PRN
  Administered 2013-12-31: 100 mL via INTRAVENOUS

## 2013-12-31 MED ORDER — DEXTROSE 5 % IV SOLN: 2.0000 g | Freq: Three times a day (TID) | INTRAVENOUS | Status: DC

## 2013-12-31 MED ORDER — OXYCODONE-ACETAMINOPHEN 5-325 MG PO TABS
1.0000 | ORAL_TABLET | ORAL | Status: DC | PRN
  Administered 2013-12-31 – 2014-01-02 (×8): 2 via ORAL
  Administered 2014-01-03 (×2): 1 via ORAL
  Administered 2014-01-03: 2 via ORAL
  Administered 2014-01-03: 1 via ORAL
  Administered 2014-01-04 – 2014-01-05 (×4): 2 via ORAL
  Administered 2014-01-05: 1 via ORAL
  Administered 2014-01-05 – 2014-01-11 (×26): 2 via ORAL
  Filled 2013-12-31: qty 1
  Filled 2013-12-31 (×15): qty 2
  Filled 2013-12-31: qty 1
  Filled 2013-12-31 (×21): qty 2
  Filled 2013-12-31: qty 1
  Filled 2013-12-31: qty 2
  Filled 2013-12-31: qty 1
  Filled 2013-12-31 (×4): qty 2

## 2013-12-31 MED ORDER — SENNOSIDES-DOCUSATE SODIUM 8.6-50 MG PO TABS
1.0000 | ORAL_TABLET | Freq: Two times a day (BID) | ORAL | Status: DC | PRN
  Administered 2014-01-09: 1 via ORAL
  Filled 2013-12-31: qty 1

## 2013-12-31 MED ORDER — VANCOMYCIN HCL 10 G IV SOLR
1500.0000 mg | Freq: Once | INTRAVENOUS | Status: AC
  Administered 2013-12-31: 1500 mg via INTRAVENOUS
  Filled 2013-12-31: qty 1500

## 2013-12-31 MED ORDER — METOPROLOL TARTRATE 25 MG PO TABS
25.0000 mg | ORAL_TABLET | Freq: Two times a day (BID) | ORAL | Status: DC
  Administered 2013-12-31 – 2014-01-07 (×15): 25 mg via ORAL
  Filled 2013-12-31 (×15): qty 1

## 2013-12-31 MED ORDER — POLYETHYLENE GLYCOL 3350 17 G PO PACK
17.0000 g | PACK | Freq: Every day | ORAL | Status: DC
  Administered 2013-12-31 – 2014-01-11 (×7): 17 g via ORAL
  Filled 2013-12-31 (×11): qty 1

## 2013-12-31 MED ORDER — DEXTROSE 5 % IV SOLN
1.0000 g | Freq: Three times a day (TID) | INTRAVENOUS | Status: DC
  Administered 2014-01-01 – 2014-01-11 (×31): 1 g via INTRAVENOUS
  Filled 2013-12-31 (×35): qty 1

## 2013-12-31 MED ORDER — SODIUM CHLORIDE 0.9 % IV SOLN
INTRAVENOUS | Status: DC
  Administered 2014-01-01 – 2014-01-07 (×10): via INTRAVENOUS
  Administered 2014-01-08: 50 mL/h via INTRAVENOUS
  Administered 2014-01-09: 16:00:00 via INTRAVENOUS

## 2013-12-31 MED ORDER — ALPRAZOLAM 0.5 MG PO TABS
0.5000 mg | ORAL_TABLET | Freq: Three times a day (TID) | ORAL | Status: DC | PRN
  Administered 2014-01-01: 0.5 mg via ORAL
  Filled 2013-12-31: qty 1

## 2013-12-31 MED ORDER — AMLODIPINE BESYLATE 5 MG PO TABS: 5.0000 mg | ORAL_TABLET | Freq: Every day | ORAL | Status: DC

## 2013-12-31 MED ORDER — SODIUM CHLORIDE 0.9 % IV BOLUS (SEPSIS)
1000.0000 mL | Freq: Once | INTRAVENOUS | Status: AC
  Administered 2013-12-31: 1000 mL via INTRAVENOUS

## 2013-12-31 MED ORDER — GUAIFENESIN ER 600 MG PO TB12
1200.0000 mg | ORAL_TABLET | Freq: Two times a day (BID) | ORAL | Status: DC
  Administered 2013-12-31 – 2014-01-11 (×22): 1200 mg via ORAL
  Filled 2013-12-31 (×27): qty 2

## 2013-12-31 MED ORDER — PANTOPRAZOLE SODIUM 40 MG PO TBEC
40.0000 mg | DELAYED_RELEASE_TABLET | Freq: Every day | ORAL | Status: DC
  Administered 2013-12-31 – 2014-01-10 (×11): 40 mg via ORAL
  Filled 2013-12-31 (×11): qty 1

## 2013-12-31 MED ORDER — VANCOMYCIN HCL IN DEXTROSE 1-5 GM/200ML-% IV SOLN
1000.0000 mg | Freq: Two times a day (BID) | INTRAVENOUS | Status: DC
  Administered 2014-01-01 – 2014-01-03 (×5): 1000 mg via INTRAVENOUS
  Filled 2013-12-31 (×4): qty 200

## 2013-12-31 MED ORDER — OXYCODONE-ACETAMINOPHEN 5-325 MG PO TABS: 1.0000 | ORAL_TABLET | Freq: Four times a day (QID) | ORAL | Status: DC | PRN

## 2013-12-31 MED ORDER — LIDOCAINE 5 % EX PTCH
2.0000 | MEDICATED_PATCH | CUTANEOUS | Status: DC
  Administered 2013-12-31 – 2014-01-10 (×11): 2 via TRANSDERMAL
  Filled 2013-12-31 (×21): qty 2

## 2013-12-31 NOTE — Progress Notes (Signed)
While doing bedside reporting with on coming RN, pt found sitting on edge of chair gasping for breath. Pt with increased WOB, BP 169/101 HR 132, R 28, O2 sat 96% on 3L via [place]. Pt states " All of a sudden it's like I can't get no air. Dr. Posey Pronto paged. Will continue to monitor.

## 2013-12-31 NOTE — Progress Notes (Signed)
  CARE MANAGEMENT ED NOTE 12/31/2013  Patient:  Ralph Chavez, Ralph Chavez   Account Number:  [id number]  Date Initiated:  12/31/2013  Documentation initiated by:  Jackelyn Poling  Subjective/Objective Assessment:   43 yr old self pay [place] pt Pt was admitted for rhabdo on August 6. Pt had used heroin mixed with cocaine. Pt sts he has a broken rib, possibly an injury after CPR from last admission. Pt sts he is having SOB due to broken rib.     Subjective/Objective Assessment Detail:   A&Ox4. Pt from home, a possible drug house that he "is staying at." Pt denies drug use but is very defensive when asked and his HR is elevated. When EMS arrived, pt insisted on going to bathroom and brushing teeth, as well as picking out a new shirt to wear to the hospital.    Pt confirms he has not followed up with any provider since his 8/11/5 d/c from [place] states he was informed to follow up with a "Black doctor on Monday" "the one that discharged me"  Pt stated today is "Sunday" and this is why he did not follow up. CM informed pt today is "Monday" and inquired if he called for a followed up appointment with "black doctor" today , Monday 12/31/13 and was informed "No"  "I was suppose to go to a clinic"  States fell frequently at home with sob and "rubbery legs"  Initially pt noted to be demanding with abrasive responses but Apologized to CM with [facility] MD, Posey Pronto, present prior to Cm leaving his room.   CM informed pt that CM had the right to assess his follow up care and would not leave until completed. Pt's behavior and responses changed  and he stated " I'm sorry. I think we started off wrong. Ok let's try to respect one another" Pt agreed to referrals to [facility] and [facility]     Action/Plan:   ED CM spoke with pt to assess follow up & pcp services CM provided pt with a list of self pay [place] providers including [facility] iniformation and placed them in his pt belong bag with his shorts   Action/Plan Detail:    Anticipated DC Date:  01/03/2014     Status Recommendation to Physician:   Result of Recommendation:    Other ED [place]  Other  Other  PCP issues  [facility] / [facility] (established/new)    Choice offered to / List presented to:  C-1 Patient          Status of service:  Completed, signed off  ED Comments:   ED Comments Detail:

## 2013-12-31 NOTE — ED Notes (Addendum)
Per EMS, Pt was admitted for rhabdo on August 6. Pt had used heroin mixed with cocaine. Pt sts he has a broken rib, possibly an injury after CPR from last admission. Pt sts he is having SOB due to broken rib. A&Ox4. Pt from home, a possible drug house that he "is staying at." Pt denies drug use but is very defensive when asked and his HR is elevated. When EMS arrived, pt insisted on going to bathroom and brushing teeth, as well as picking out a new shirt to wear to the hospital.

## 2013-12-31 NOTE — H&P (Addendum)
[facility] History and Physical  Patient: Ralph Chavez  KVQ:[id number]  DOB: Dec 30, 1970  DOS: the patient was seen and examined on 12/31/2013 PCP: No PCP Per Patient  Chief Complaint: chest pain and shortness of breath  HPI: Ralph Chavez is a 43 y.o. male with Past medical history of anxiety, substance abuse. The patient presented with complaints of increasing work of breathing and generalized weakness and lethargy. He was recently admitted for rhabdomyolysis after heroin overdose, he also developed aspiration pneumonitis and was treated with Unasyn, he also had non-STEMI with normal coronaries. He was discharged on Augmentin and was recommended to followup as an outpatient. Patient mentions that he has been taking all his medications regularly although not able to verify the actual doses of the medications. He mentions that he had 3 falls 3 days ago when he was walking and his legs gave out, he had another fall yesterday and he had near falls in which she has hold himself before he fell down. He complains of a rubbery feeling in his legs with severe pain bilaterally. He denies any dizziness or lightheadedness he does not have any focal deficit on the upper hand. He complains of right-sided chest pain. He does of shortness of breath progressively worsening. He denies any fever or chills but does have some cough. The chest pain feels like a pleuritic chest pain.  The patient is coming from home. And at his baseline independent for most of his ADL.  Review of Systems: as mentioned in the history of present illness.  A Comprehensive review of the other systems is negative.  Past Medical History  Diagnosis Date  . Anxiety   . Ulcer    History reviewed. No pertinent past surgical history. Social History:  reports that he has been smoking Cigarettes.  He has been smoking about 0.00 packs per day. He has never used smokeless tobacco. He reports that he drinks alcohol. He reports that  he uses illicit drugs (Cocaine and Heroin).  No Known Allergies  History reviewed. No pertinent family history.  Prior to Admission medications   Medication Sig Start Date End Date Taking? Authorizing Provider  amLODipine (NORVASC) 5 MG tablet Take 1 tablet (5 mg total) by mouth daily. 12/25/13   Verlee Monte, MD  amoxicillin-clavulanate (AUGMENTIN) 875-125 MG per tablet Take 1 tablet by mouth 2 (two) times daily. 12/25/13   Verlee Monte, MD  guaiFENesin (MUCINEX) 600 MG 12 hr tablet Take 2 tablets (1,200 mg total) by mouth 2 (two) times daily. 12/25/13   Verlee Monte, MD  omeprazole (PRILOSEC) 20 MG capsule Take 20 mg by mouth daily.    Historical Provider, MD  oxyCODONE-acetaminophen (ROXICET) 5-325 MG per tablet Take 1 tablet by mouth every 6 (six) hours as needed. 12/25/13   Verlee Monte, MD    Physical Exam: Filed Vitals:   12/31/13 1802 12/31/13 2024 12/31/13 2100 12/31/13 2127  BP: 141/82 174/87  149/97  Pulse: 139 128  129  Temp: 98.3 F (36.8 C)   98.2 F (36.8 C)  TempSrc: Oral   Oral  Resp: 22 45  28  Height:   5' 10.08" (1.78 m) 5\' 10"  (1.778 m)  Weight:   100 kg (220 lb 7.4 oz) 100.109 kg (220 lb 11.2 oz)  SpO2: 94% 98%  99%    General: Alert, Awake and Oriented to Time, Place and Person. Appear in mild distress Eyes: PERRL ENT: Oral Mucosa clear moist. Neck: no JVD Cardiovascular: S1 and S2 Present, no Murmur, Peripheral  Pulses Present Respiratory: Bilateral Air entry equal and Decreased with limited on right base, bilateral Crackles, no wheezes Abdomen: Bowel Sound Present, Soft and Non tender Skin: no Rash Extremities: no Pedal edema, no calf tenderness Neurologic: Grossly no focal neuro deficit.  Labs on Admission:  CBC:  Recent Labs Lab 12/25/13 0500 12/31/13 1831  WBC 27.2* 20.3*  NEUTROABS  --  17.2*  HGB 13.5 12.8*  HCT 39.6 38.1*  MCV 90.4 92.3  PLT 307 457*    CMP     Component Value Date/Time   NA 138 12/31/2013 1831   K 3.4* 12/31/2013  1831   CL 98 12/31/2013 1831   CO2 25 12/31/2013 1831   GLUCOSE 167* 12/31/2013 1831   BUN 3* 12/31/2013 1831   CREATININE 0.66 12/31/2013 1831   CALCIUM 9.0 12/31/2013 1831   PROT 6.1 12/25/2013 0500   ALBUMIN 2.3* 12/25/2013 0500   AST 37 12/25/2013 0500   ALT 70* 12/25/2013 0500   ALKPHOS 77 12/25/2013 0500   BILITOT 0.4 12/25/2013 0500   GFRNONAA >90 12/31/2013 1831   GFRAA >90 12/31/2013 1831    No results found for this basename: LIPASE, AMYLASE,  in the last 168 hours No results found for this basename: AMMONIA,  in the last 168 hours   Recent Labs Lab 12/31/13 1831  CKTOTAL 42   BNP (last 3 results)  Recent Labs  12/24/13 0409  PROBNP 538.5*    Radiological Exams on Admission: Dg Chest 2 View  12/31/2013   CLINICAL DATA:  Pneumonia.  Recent heart attack.  Smoker  EXAM: CHEST  2 VIEW  COMPARISON:  12/24/2013  FINDINGS: The heart size appears normal. There is been significant interval decrease in aeration to right lung which is felt a likely represent a combination of large pleural effusion and associated atelectasis. Pulmonary vascular congestion is noted.  IMPRESSION: 1. Interval development of large right pleural effusion with associated compressive type atelectasis of the right lung.   Electronically Signed   By: Kerby Moors M.D.   On: 12/31/2013 19:17   Ct Angio Chest Pe W/cm &/or Wo Cm  12/31/2013   CLINICAL DATA:  Shortness of breath.  Large right pleural effusion.  EXAM: CT ANGIOGRAPHY CHEST WITH CONTRAST  TECHNIQUE: Multidetector CT imaging of the chest was performed using the standard protocol during bolus administration of intravenous contrast. Multiplanar CT image reconstructions and MIPs were obtained to evaluate the vascular anatomy.  CONTRAST:  125mL OMNIPAQUE IOHEXOL 350 MG/ML SOLN  COMPARISON:  Chest x-ray, same date.  FINDINGS: Chest wall:  No chest wall mass, supraclavicular or axillary lymphadenopathy. The thyroid gland is normal. The bony thorax is intact. No  destructive bone lesions or spinal canal compromise. The sternum is intact.  Mediastinum:  The heart is normal in size. No mediastinal or hilar mass or adenopathy. Borderline scattered lymph nodes. The esophagus is grossly normal. The aorta is normal in caliber. No dissection.  Lungs:  There is a large loculated appearing right pleural effusion occupying a good portion of the right hemi thorax. There is severe compressive atelectasis of the right long which is near complete except for the right upper lobe. No endobronchial lesion or obvious bronchial obstruction. I do not see any obvious enhancement of the pleural to suggest empyema. No pleural nodularity. The left lung is clear except because of the large pleural effusion there is mild mass effect on the heart and mediastinum to the left.  Upper abdomen:  Unremarkable.  Review of the  MIP images confirms the above findings.  IMPRESSION: 1. Large loculated appearing right pleural effusion with near complete compressive atelectasis of the right lung. There is also mass effect on the heart and mediastinal structures which are shifted to the left. I do not see any obvious pleural enhancement but empyema is a possibility. 2. No mediastinal or hilar mass or adenopathy.   Electronically Signed   By: Kalman Jewels M.D.   On: 12/31/2013 21:30    EKG: Independently reviewed. sinus tachycardia. Assessment/Plan Principal Problem:   Loculated pleural effusion Active Problems:   Paresthesia of both lower extremities with weakness   Body aches   Chest pain   1. Loculated pleural effusion The patient is presenting with complaints of right-sided chest pain and shortness of breath. He was found to have right-sided pneumonia during last admission and was treated with Unasyn due to suspicion for aspiration pneumonia. At present he mentions he is compliant with his medication but unable to verify the doses. His chest x-ray shows significantly large right-sided pleural  effusion. A CT of the chest showing likely due to presence of the pleural effusion with mass effect. I did discuss the case with pulmonary who will be following the patient overnight. Patient will be given broad-spectrum antibiotics. We will continue him on incentive spirometry. Oxygen as needed. IV hydration. He'll be kept n.p.o. after midnight.  2. Paresthesias of both lower extremity and weakness. Patient presented with similar symptoms in the last admission and had extensive work up including CT lumbar spine C-spine thoracic spine MRI thoracic spine. At present he does not appear to have any significant deficit on appears spontaneously moving all his extremities on his own bilaterally and withdraws to pain bilaterally. With this the patient will be admitted in the hospital we will follow serial neuro checks and consult PTOT in the morning.  3. Generalized body aches. Right-sided chest pain. Patient has history of substance abuse but at present he appears to have significant right-sided pleural effusion with body aches. He is hypertensive and tachycardic. At present I will give him Percocet, lidocaine patch and Dilaudid as needed for pain management.  4. Constipation. MiraLAX and Senokot.   Consults: pulmonary  DVT Prophylaxis: subcutaneous Heparin Nutrition: npo after mid night  Code Status: full  Family Communication: wife was present at bedside, opportunity was given to ask question and all questions were answered satisfactorily at the time of interview. Disposition: Admitted to inpatient in telemetry unit.  Author: Berle Mull, MD [facility] Pager: [phone] 12/31/2013, 9:47 PM    If 7PM-7AM, please contact night-coverage [email or web address] Password [email or web address]  **Disclaimer: This note may have been dictated with voice recognition software. Similar sounding words can inadvertently be transcribed and this note may contain transcription errors which may not have been  corrected upon publication of note.**   Addendum The patient continues to have increasing work of breathing although maintaining 96% on 2 L of oxygen. Blood pressure, hemodynamically he is unchanged from my ED evaluation. Due to increasing need of close monitoring the patient to start transferred to step down unit as per the nursing request.  Ralph Chavez 11:32 PM

## 2013-12-31 NOTE — Progress Notes (Signed)
ANTIBIOTIC CONSULT NOTE - INITIAL  Pharmacy Consult for Vancomycin Indication: PNA  No Known Allergies  Patient Measurements:   Adjusted Body Weight:   Vital Signs: Temp: 98.3 F (36.8 C) (08/17 1802) Temp src: Oral (08/17 1802) BP: 174/87 mmHg (08/17 2024) Pulse Rate: 128 (08/17 2024) Intake/Output from previous day:   Intake/Output from this shift:    Labs:  Recent Labs  12/31/13 1831  WBC 20.3*  HGB 12.8*  PLT 457*  CREATININE 0.66   The CrCl is unknown because both a height and weight (above a minimum accepted value) are required for this calculation. No results found for this basename: VANCOTROUGH, Corlis Leak, VANCORANDOM, GENTTROUGH, GENTPEAK, GENTRANDOM, TOBRATROUGH, TOBRAPEAK, TOBRARND, AMIKACINPEAK, AMIKACINTROU, AMIKACIN,  in the last 72 hours   Microbiology: Recent Results (from the past 720 hour(s))  MRSA PCR SCREENING     Status: None   Collection Time    12/20/13  7:56 AM      Result Value Ref Range Status   MRSA by PCR NEGATIVE  NEGATIVE Final   Comment:            The GeneXpert MRSA Assay (FDA     approved for NASAL specimens     only), is one component of a     comprehensive MRSA colonization     surveillance program. It is not     intended to diagnose MRSA     infection nor to guide or     monitor treatment for     MRSA infections.  CULTURE, EXPECTORATED SPUTUM-ASSESSMENT     Status: None   Collection Time    12/21/13  9:15 AM      Result Value Ref Range Status   Specimen Description SPUTUM   Final   Special Requests NONE   Final   Sputum evaluation     Final   Value: MICROSCOPIC FINDINGS SUGGEST THAT THIS SPECIMEN IS NOT REPRESENTATIVE OF LOWER RESPIRATORY SECRETIONS. PLEASE RECOLLECT.     Gram Stain Report Called to,Read Back By and Verified With: Dooling, M AT 0942 ON 12/21/13 BY HOBBINS, J.   Report Status 12/21/2013 FINAL   Final    Medical History: Past Medical History  Diagnosis Date  . Anxiety   . Ulcer    Assessment: 89 yoM  with hx polysubstance abuse and recent admission for rhabdomyolysis and aspiration PNA presents to [facility] on 8/17 with possible recurrent PNA vs pleural effusion.  MD starting cefepime and pharmacy consulted to dose vancomycin. First doses of zosyn 3.375g x1 and  vancomycin 1500mg  IV x 1 given in ED already.   8/17 >> Vancomycin  >> 8/17 >> Cefepime  >>    Tmax:98.3 WBCs: Elevated at 20.3K Renal: SCr 0.66, CrCl >100 (N and CG)  8/17 blood: collected  Goal of Therapy:  Vancomycin trough level 15-20 mcg/ml  Plan:  Vancomycin 1g IV q12h (following loading dose) F/u orders from MD for cefepime Vanc trough at [facility] as appropriate  Ralene Bathe, PharmD, BCPS 12/31/2013, 9:16 PM  Pager: [phone]

## 2013-12-31 NOTE — ED Notes (Signed)
Floor called to get report, floor RN unsure pt is appropriate for [facility]. Dr. Posey Pronto called. Floor RN also asked that CT angio be done in ED before sending pt. Upstairs.

## 2013-12-31 NOTE — Progress Notes (Signed)
Pt on telephone cursing at and arguing with someone. Pt finally got off telephone. I explained that I had to update his nursing history from previous admission last week and pt was very uncooperative answering nursing history admission questions. Lucius Conn BSN, RN-BC Admissions RN  12/31/2013 8:01 PM

## 2013-12-31 NOTE — ED Notes (Signed)
Pt has been reminded multiple times to keep BP cuff, and pulse Ox on at all times. Pt continues to take these things off.

## 2013-12-31 NOTE — Progress Notes (Signed)
Dr Posey Pronto returned page. New order for stepdown placed in Epic. Will continue to monitor.

## 2014-01-01 ENCOUNTER — Inpatient Hospital Stay (HOSPITAL_COMMUNITY): Payer: Self-pay

## 2014-01-01 DIAGNOSIS — F4322 Adjustment disorder with anxiety: Secondary | ICD-10-CM

## 2014-01-01 DIAGNOSIS — F1910 Other psychoactive substance abuse, uncomplicated: Secondary | ICD-10-CM

## 2014-01-01 DIAGNOSIS — K219 Gastro-esophageal reflux disease without esophagitis: Secondary | ICD-10-CM

## 2014-01-01 DIAGNOSIS — J9600 Acute respiratory failure, unspecified whether with hypoxia or hypercapnia: Secondary | ICD-10-CM

## 2014-01-01 DIAGNOSIS — F17200 Nicotine dependence, unspecified, uncomplicated: Secondary | ICD-10-CM

## 2014-01-01 DIAGNOSIS — J90 Pleural effusion, not elsewhere classified: Secondary | ICD-10-CM

## 2014-01-01 DIAGNOSIS — J189 Pneumonia, unspecified organism: Secondary | ICD-10-CM

## 2014-01-01 DIAGNOSIS — D72829 Elevated white blood cell count, unspecified: Secondary | ICD-10-CM

## 2014-01-01 LAB — GRAM STAIN

## 2014-01-01 LAB — LEGIONELLA ANTIGEN, URINE: LEGIONELLA ANTIGEN, URINE: NEGATIVE

## 2014-01-01 LAB — MRSA PCR SCREENING: MRSA by PCR: NEGATIVE

## 2014-01-01 LAB — STREP PNEUMONIAE URINARY ANTIGEN: Strep Pneumo Urinary Antigen: NEGATIVE

## 2014-01-01 MED ORDER — LIDOCAINE HCL 1 % IJ SOLN
INTRAMUSCULAR | Status: AC
  Administered 2014-01-01: 10:00:00
  Filled 2014-01-01: qty 20

## 2014-01-01 MED ORDER — IPRATROPIUM-ALBUTEROL 0.5-2.5 (3) MG/3ML IN SOLN: 3.0000 mL | RESPIRATORY_TRACT | Status: DC | PRN

## 2014-01-01 MED ORDER — NICOTINE 14 MG/24HR TD PT24
14.0000 mg | MEDICATED_PATCH | Freq: Every day | TRANSDERMAL | Status: DC
  Administered 2014-01-01 – 2014-01-07 (×7): 14 mg via TRANSDERMAL
  Filled 2014-01-01 (×11): qty 1

## 2014-01-01 MED ORDER — MORPHINE SULFATE 4 MG/ML IJ SOLN
4.0000 mg | Freq: Once | INTRAMUSCULAR | Status: AC
  Administered 2014-01-01: 4 mg via INTRAVENOUS
  Filled 2014-01-01: qty 1

## 2014-01-01 MED ORDER — GABAPENTIN 100 MG PO CAPS
100.0000 mg | ORAL_CAPSULE | Freq: Three times a day (TID) | ORAL | Status: DC
  Administered 2014-01-01 – 2014-01-06 (×16): 100 mg via ORAL
  Filled 2014-01-01 (×16): qty 1

## 2014-01-01 MED ORDER — BUDESONIDE 0.25 MG/2ML IN SUSP
0.2500 mg | Freq: Two times a day (BID) | RESPIRATORY_TRACT | Status: DC
  Filled 2014-01-01: qty 2

## 2014-01-01 MED ORDER — FENTANYL CITRATE 0.05 MG/ML IJ SOLN
INTRAMUSCULAR | Status: AC
  Filled 2014-01-01: qty 2

## 2014-01-01 MED ORDER — THIAMINE HCL 100 MG/ML IJ SOLN
100.0000 mg | Freq: Every day | INTRAMUSCULAR | Status: DC
Start: 2014-01-01 — End: 2014-01-11
  Administered 2014-01-01 – 2014-01-11 (×11): 100 mg via INTRAVENOUS
  Filled 2014-01-01: qty 1
  Filled 2014-01-01: qty 2
  Filled 2014-01-01: qty 1
  Filled 2014-01-01 (×8): qty 2

## 2014-01-01 MED ORDER — CHLORDIAZEPOXIDE HCL 10 MG PO CAPS
10.0000 mg | ORAL_CAPSULE | Freq: Three times a day (TID) | ORAL | Status: DC
  Administered 2014-01-01 – 2014-01-03 (×9): 10 mg via ORAL
  Filled 2014-01-01 (×9): qty 1

## 2014-01-01 MED ORDER — MIDAZOLAM HCL 2 MG/2ML IJ SOLN
2.0000 mg | Freq: Once | INTRAMUSCULAR | Status: AC
  Administered 2014-01-01: 2 mg via INTRAVENOUS

## 2014-01-01 MED ORDER — FOLIC ACID 5 MG/ML IJ SOLN
1.0000 mg | Freq: Every day | INTRAMUSCULAR | Status: DC
  Administered 2014-01-01 – 2014-01-11 (×11): 1 mg via INTRAVENOUS
  Filled 2014-01-01 (×13): qty 0.2

## 2014-01-01 MED ORDER — FENTANYL CITRATE 0.05 MG/ML IJ SOLN
50.0000 ug | Freq: Once | INTRAMUSCULAR | Status: AC
  Administered 2014-01-01: 50 ug via INTRAVENOUS

## 2014-01-01 MED ORDER — LORAZEPAM 2 MG/ML IJ SOLN
1.0000 mg | Freq: Once | INTRAMUSCULAR | Status: AC
  Administered 2014-01-01: 1 mg via INTRAVENOUS

## 2014-01-01 MED ORDER — POTASSIUM CHLORIDE CRYS ER 20 MEQ PO TBCR
40.0000 meq | EXTENDED_RELEASE_TABLET | ORAL | Status: AC
  Administered 2014-01-01 (×3): 40 meq via ORAL
  Filled 2014-01-01 (×3): qty 2

## 2014-01-01 MED ORDER — HEPARIN SODIUM (PORCINE) 5000 UNIT/ML IJ SOLN
5000.0000 [IU] | Freq: Three times a day (TID) | INTRAMUSCULAR | Status: DC
  Administered 2014-01-01 – 2014-01-11 (×29): 5000 [IU] via SUBCUTANEOUS
  Filled 2014-01-01 (×36): qty 1

## 2014-01-01 MED ORDER — LORAZEPAM 2 MG/ML IJ SOLN
INTRAMUSCULAR | Status: AC
Start: 2014-01-01 — End: 2014-01-02
  Filled 2014-01-01: qty 1

## 2014-01-01 MED ORDER — MIDAZOLAM HCL 2 MG/2ML IJ SOLN
INTRAMUSCULAR | Status: AC
  Filled 2014-01-01: qty 2

## 2014-01-01 NOTE — Progress Notes (Signed)
CARE MANAGEMENT NOTE 01/01/2014  Patient:  Ralph Chavez, Ralph Chavez   Account Number:  [id number]  Date Initiated:  01/01/2014  Documentation initiated by:  Ramonica Grigg  Subjective/Objective Assessment:   pt with hx of polysubstance abuse/presented with increased wob films reveals a large pl.effusion with compression of the rt lung and shirting of the cardiac structures to the right     Action/Plan:   home when stable/pt did not follow up with plans after last and most recent admission/from previous admissionPROVIDED W/OCMMUNITY [place]   Anticipated DC Date:  01/04/2014   Anticipated DC Plan:  HOME/SELF CARE  In-house referral  Clinical Social Worker  Development worker, community      DC Planning Services  CM consult      [facility] Choice  NA   Choice offered to / List presented to:  NA      DME agency  NA     [place] arranged  NA      [place] agency  NA   Status of service:  In process, will continue to follow Medicare Important Message given?  NA - LOS <3 / Initial given by admissions (If response is "NO", the following Medicare IM given date fields will be blank) Date Medicare IM given:   Medicare IM given by:   Date Additional Medicare IM given:   Additional Medicare IM given by:    Discharge Disposition:    Per UR Regulation:  Reviewed for med. necessity/level of care/duration of stay  If discussed at [place] of Stay Meetings, dates discussed:    Comments:  [id number] to have chest tube insertion due to extremely large plueral effusion and compression of the rt lung and left shifting of the cardiac structures. Bailei Buist,RN,BSN,CCM

## 2014-01-01 NOTE — Progress Notes (Signed)
Pt currently off BIPAP and tolerating well on room air.  RT to monitor and assess as needed.

## 2014-01-01 NOTE — Progress Notes (Signed)
At 22:12 pt was alert and oriented x4, reviewed fall prevention safety plan in which pt signed. At 22:50 this nurse entered the room to reset the pt B/P cuff. Pt was found lethargic, had to be shaken to wake him, pt was snoring. Neuro check completed, NP for [facility] notified.   Pt was given 2 Oxycodone at 22:12, pt has been given the same dose 4 times over the last two days and did not have this level of reaction. Pt's girlfriend left at 22:45, curtains were drawn and doors shut, so it "cannot ruled out that he may have taken something in addition", other than was has been hospital administered.   Neuro checks will be done q1hr x 6 and no other pain medication will be administered until pt is more alert.

## 2014-01-01 NOTE — Plan of Care (Signed)
Problem: Phase I Progression Outcomes Goal: OOB as tolerated unless otherwise ordered Outcome: Progressing Pt is OOB to bedside to use urinal. Pt is a high falls risk as he has fallen several times before this admission. Pt has scabs on both knees, ankles and head from prior falls. Prior to administering pain medication fall prevention safety plan was reviewed in detail with patient and signed.

## 2014-01-01 NOTE — Consult Note (Signed)
PULMONARY / CRITICAL CARE MEDICINE   Name: Ralph Chavez MRN: [id number] DOB: 1970-05-29    ADMISSION DATE:  12/31/2013 CONSULTATION DATE:  01/01/2014  REFERRING MD :  Dyann Kief [facility]  CHIEF COMPLAINT:  Shortness of breath, pain all over  INITIAL PRESENTATION: 43 y/o male with a history of polysubstance abuse admitted on 8/17 with increasing dyspnea and a large R pleural effusion after a recent episode of RLL pneumonia.  STUDIES:  8/18 CT chest > large R pleural effusion with compressive atelectasis of R lung, some left shift noted  SIGNIFICANT EVENTS: 8/6 admitted for inability to walk chest pain, rhabdomyolysis 8/6 MRI Thoracic spine > no acute significant findings in thoracic spine, tiny t8-t9 disc protusion, RLL pneumonia 8/10 LHC > no CAD 8/11 discharged home on augmentin   HISTORY OF PRESENT ILLNESS:  43 y/o male with polysubstance abuse admitted on 8/17 to the [facility] service for acute hypoxemic respiratory failure and pain all over.  He was recently admitted to [facility] for chest pain (NSTEMI), rhabdomyolysis, and RLL pneumonia from 8/6 to 8/11. Apparently that all occurred two days after trying IV heroin (and cocaine?) "for the first and only time".  He had a normal left heart catheterization and was discharged home on pain medications and augmentin.  He said he went home and did nothing but lay on a couch and watch TV.  He said that he took his antibiotics.  He came back to the ED on 8/17 due to worsening dyspnea and pain all over. He continues to cough.  He was found to have a large R pleural effusion so PCCM was consulted.  PAST MEDICAL HISTORY :  Past Medical History  Diagnosis Date  . Anxiety   . Ulcer    History reviewed. No pertinent past surgical history. Prior to Admission medications   Medication Sig Start Date End Date Taking? Authorizing Provider  amLODipine (NORVASC) 5 MG tablet Take 1 tablet (5 mg total) by mouth daily. 12/25/13  Yes Verlee Monte, MD   amoxicillin-clavulanate (AUGMENTIN) 875-125 MG per tablet Take 1 tablet by mouth 2 (two) times daily. 12/25/13  Yes Verlee Monte, MD  naproxen sodium (ANAPROX) 220 MG tablet Take 440 mg by mouth 2 (two) times daily as needed (pain).   Yes Historical Provider, MD  omeprazole (PRILOSEC) 20 MG capsule Take 20 mg by mouth daily.   Yes Historical Provider, MD  oxyCODONE-acetaminophen (ROXICET) 5-325 MG per tablet Take 1 tablet by mouth every 6 (six) hours as needed. 12/25/13  Yes Verlee Monte, MD   No Known Allergies  FAMILY HISTORY:  History reviewed. No pertinent family history. SOCIAL HISTORY:  reports that he has been smoking Cigarettes.  He has been smoking about 0.00 packs per day. He has never used smokeless tobacco. He reports that he drinks alcohol. He reports that he uses illicit drugs (Cocaine and Heroin).  REVIEW OF SYSTEMS:   Gen: Denies fever, chills, weight change, + fatigue, night sweats HEENT: Denies blurred vision, double vision, hearing loss, tinnitus, sinus congestion, rhinorrhea, sore throat, neck stiffness, dysphagia PULM: per HPI CV: + chest pain, denies edema, orthopnea, paroxysmal nocturnal dyspnea, palpitations GI: Denies abdominal pain, nausea, vomiting, diarrhea, hematochezia, melena, constipation, change in bowel habits GU: Denies dysuria, hematuria, polyuria, oliguria, urethral discharge Endocrine: Denies hot or cold intolerance, polyuria, polyphagia or appetite change Derm: Denies rash, dry skin, scaling or peeling skin change Heme: Denies easy bruising, bleeding, bleeding gums Neuro: Denies headache, numbness, weakness, slurred speech, loss of memory or consciousness  SUBJECTIVE:   VITAL SIGNS: Temp:  [98.2 F (36.8 C)-99.1 F (37.3 C)] 98.5 F (36.9 C) (08/18 0800) Pulse Rate:  [93-139] 107 (08/18 0800) Resp:  [17-45] 21 (08/18 0800) BP: (119-174)/(46-97) 132/46 mmHg (08/18 0800) SpO2:  [94 %-100 %] 100 % (08/18 0800) FiO2 (%):  [30 %-100 %] 100 % (08/18  0800) Weight:  [100 kg (220 lb 7.4 oz)-101.3 kg (223 lb 5.2 oz)] 101.3 kg (223 lb 5.2 oz) (08/18 0022) HEMODYNAMICS:   VENTILATOR SETTINGS: Vent Mode:  [-] BIPAP FiO2 (%):  [30 %-100 %] 100 % Set Rate:  [12 bmp] 12 bmp INTAKE / OUTPUT:  Intake/Output Summary (Last 24 hours) at 01/01/14 0841 Last data filed at 01/01/14 0800  Gross per 24 hour  Intake   1395 ml  Output    400 ml  Net    995 ml    PHYSICAL EXAMINATION: Gen: tachypnic, mild pain, oriented HEENT: NCAT, PERRL, EOMi, OP clear, neck supple without masses PULM: diminished throughout R lung, CTA L CV: tachy, no clear murmur, no JVD AB: BS+, soft, nontender, no hsm Ext: warm, no edema, no clubbing, no cyanosis Derm: multiple extensor surface healed abrasions Neuro: A&Ox4, CN II-XII intact, MAEW   LABS:  CBC  Recent Labs Lab 12/31/13 1831  WBC 20.3*  HGB 12.8*  HCT 38.1*  PLT 457*   Coag's No results found for this basename: APTT, INR,  in the last 168 hours BMET  Recent Labs Lab 12/31/13 1831  NA 138  K 3.4*  CL 98  CO2 25  BUN 3*  CREATININE 0.66  GLUCOSE 167*   Electrolytes  Recent Labs Lab 12/31/13 1831  CALCIUM 9.0   Sepsis Markers  Recent Labs Lab 12/31/13 1840  LATICACIDVEN 2.9*   ABG No results found for this basename: PHART, PCO2ART, PO2ART,  in the last 168 hours Liver Enzymes No results found for this basename: AST, ALT, ALKPHOS, BILITOT, ALBUMIN,  in the last 168 hours Cardiac Enzymes No results found for this basename: TROPONINI, PROBNP,  in the last 168 hours Glucose No results found for this basename: GLUCAP,  in the last 168 hours  Imaging Dg Chest 2 View  12/31/2013   CLINICAL DATA:  Pneumonia.  Recent heart attack.  Smoker  EXAM: CHEST  2 VIEW  COMPARISON:  12/24/2013  FINDINGS: The heart size appears normal. There is been significant interval decrease in aeration to right lung which is felt a likely represent a combination of large pleural effusion and associated  atelectasis. Pulmonary vascular congestion is noted.  IMPRESSION: 1. Interval development of large right pleural effusion with associated compressive type atelectasis of the right lung.   Electronically Signed   By: Kerby Moors M.D.   On: 12/31/2013 19:17   Ct Angio Chest Pe W/cm &/or Wo Cm  12/31/2013   CLINICAL DATA:  Shortness of breath.  Large right pleural effusion.  EXAM: CT ANGIOGRAPHY CHEST WITH CONTRAST  TECHNIQUE: Multidetector CT imaging of the chest was performed using the standard protocol during bolus administration of intravenous contrast. Multiplanar CT image reconstructions and MIPs were obtained to evaluate the vascular anatomy.  CONTRAST:  122mL OMNIPAQUE IOHEXOL 350 MG/ML SOLN  COMPARISON:  Chest x-ray, same date.  FINDINGS: Chest wall:  No chest wall mass, supraclavicular or axillary lymphadenopathy. The thyroid gland is normal. The bony thorax is intact. No destructive bone lesions or spinal canal compromise. The sternum is intact.  Mediastinum:  The heart is normal in size. No mediastinal or  hilar mass or adenopathy. Borderline scattered lymph nodes. The esophagus is grossly normal. The aorta is normal in caliber. No dissection.  Lungs:  There is a large loculated appearing right pleural effusion occupying a good portion of the right hemi thorax. There is severe compressive atelectasis of the right long which is near complete except for the right upper lobe. No endobronchial lesion or obvious bronchial obstruction. I do not see any obvious enhancement of the pleural to suggest empyema. No pleural nodularity. The left lung is clear except because of the large pleural effusion there is mild mass effect on the heart and mediastinum to the left.  Upper abdomen:  Unremarkable.  Review of the MIP images confirms the above findings.  IMPRESSION: 1. Large loculated appearing right pleural effusion with near complete compressive atelectasis of the right lung. There is also mass effect on the  heart and mediastinal structures which are shifted to the left. I do not see any obvious pleural enhancement but empyema is a possibility. 2. No mediastinal or hilar mass or adenopathy.   Electronically Signed   By: Kalman Jewels M.D.   On: 12/31/2013 21:30     ASSESSMENT / PLAN:  PULMONARY OETT N/A A: Acute R parapneumonic effusion > appears complicated/loculated on CT, but suspect he will drain well with a chest tube HCAP Acute hypoxemic respiratory failure due to the above problems P:   place pigtail now flush pigtail per protocol if drainage not complete, will instill TPA CXR in AM Titrate supplemental O2 for O2 saturation > 92% see ID incentive spirometry  flutter valve  CARDIOVASCULAR A: Sinus tachycardia due to pain P:  tele  RENAL A:  Hypokalemia P:   Daily BMET Monitor UOP Replace electrolytes as needed  GASTROINTESTINAL A:  No acute issues P:   Advance diet  HEMATOLOGIC A:  Thrombocytosis > reactive P:  -monitor CBC  INFECTIOUS A:  HCAP with parapneumonic effusion IV drug abuse BCx2 8/17 > Sputum 8/17 > P:   Vanc 8/17 >  Cefepime 8/17 >  Will narrow antibiotics and plan duration based on culture results Watch blood cultures closely with history of IV drug abuse  ENDOCRINE A:  No acute issues P:   monitor glucose  NEUROLOGIC A:  Agitation, pain  EtOH abuse P:   Thiamine/folate daily Librium Prn narcotics per primary service  TODAY'S SUMMARY:  43 y/o male with polysubstance abuse here with acute hypoxemic respiratory failure due to a parapneumonic effusion related to HCAP.  Needs drainage today.  May need tPA.  Titrate O2, monitor closely in ICU  I have personally obtained a history, examined the patient, evaluated laboratory and imaging results, formulated the assessment and plan and placed orders. CRITICAL CARE: The patient is critically ill with multiple organ systems failure and requires high complexity decision making for  assessment and support, frequent evaluation and titration of therapies, application of advanced monitoring technologies and extensive interpretation of multiple databases. Critical Care Time devoted to patient care services described in this note is 40 minutes.    Roselie Awkward, MD [place] [facility] Pager: [phone] Cell: [phone] If no response, call [phone]   01/01/2014, 8:41 AM

## 2014-01-01 NOTE — Progress Notes (Signed)
Pt becoming verbally abusive, pulling leads off, taking oxygen off, and when the nurse puts the items back on the patient becomes verbally abusive.  Patient has tried to get out of bed twice, and helped to void twice this morning.  Dr. Dyann Kief notified and new orders received.  Continue to monitor patient closely.  Bed exit alarm on.  Solara Goodchild Roselie Awkward RN

## 2014-01-01 NOTE — Procedures (Signed)
Chest Tube Insertion Procedure Note  Indications:  Clinically significant Effusion  Pre-operative Diagnosis: Effusion  Post-operative Diagnosis: Effusion  Procedure Details  Ultrasound was used to identify the best pocket of pleural fluid in the R chest.  Then the skin was marked.  Informed consent was obtained for the procedure, including sedation.  Risks of lung perforation, hemorrhage, arrhythmia, and adverse drug reaction were discussed.   1% lidocaine (5cc) was used for local anestehsia.  The patient was also given 2mg  versed and 40mcg fentanyl IV.  After sterile skin prep, using standard technique, a 14 French tube was placed in the right lateral 6th rib space.  Findings: > 200 ml of serous fluid obtained intitially, the chest tube was still draining at completion of the procedure  Estimated Blood Loss:  Minimal         Specimens:  Sent serosanguinous fluid for culture              Complications:  None; patient tolerated the procedure well.         Disposition: ICU - extubated and stable.         Condition: stable  Attending Attestation: I was present and scrubbed for the key portions of the procedure.  Procedure performed with Noe Gens NP  Roselie Awkward, MD [place] [facility] Pager: [phone] Cell: [phone] If no response, call [phone]

## 2014-01-01 NOTE — Progress Notes (Addendum)
[facility] PROGRESS NOTE  Ralph Chavez OZH:[id number] DOB: 1970-09-05 DOA: 12/31/2013 PCP: No PCP Per Patient  Assessment/Plan: 1-chest pain and SOB: secondary to large pleural effusion and possible empyema. -patient was febrile prior to admission and WBC's in 20,000 range -will continue broad spectrum antibiotics -PCCM consulted for eval/rec's regarding thoracentesis/chest tube vs needs of VATS -will start pulmicort and PRN nebulizer treatment for wheezing -continue oxygen supplementation  2-paresthesia, chronic pain and bilateral lower extremity weakness: extensive work up completed on recent prior admission. -continue PRN analgesics -PT/OT -low dose neurontin  3-tobacco abuse: cessation counseling provided -start nicotine patch  4-anxiety: continue PRN xanax  5-GERD: continue PPI  6-wheezing: had hx of tobacco abuse, but not PFT's to rule out or diagnosed COPD. -cessation counseling provided -started on pulmicort and albuterol -will benefit of PFT's as an outpatient.  7-PSA: will use librium protocol for withdrawal -patient also on xanax for anxiety  DVT: heparin  Code Status: Full Family Communication: no family at bedside Disposition Plan: to be determine; remains inpatient and stepdown   Consultants:  PCCM  Procedures:  See below for x-ray reports -CT scan of the chest with large pleural effusion causing mass effect to mediastinum organs; concerns for loculation and possible early empyema   Antibiotics:  Vancomycin 8/17  Zosyn  8/17  HPI/Subjective: Afebrile, no nausea, vomiting or abd pain. Complaining of chest pain (right side), some difficulty breathing and intermittent coughing spells.  Objective: Filed Vitals:   01/01/14 0800  BP: 132/46  Pulse: 107  Temp: 98.5 F (36.9 C)  Resp: 21    Intake/Output Summary (Last 24 hours) at 01/01/14 0857 Last data filed at 01/01/14 0800  Gross per 24 hour  Intake   1395 ml  Output    400 ml   Net    995 ml   Filed Weights   12/31/13 2100 12/31/13 2127 01/01/14 0022  Weight: 100 kg (220 lb 7.4 oz) 100.109 kg (220 lb 11.2 oz) 101.3 kg (223 lb 5.2 oz)    Exam:   General:  AAOX3, complaining pleurisy (right side), some difficulty breathing and with intermittent coughing spells.  Cardiovascular: mild tachycardia, no rubs or gallops, S1 and S2, present   Respiratory: exp wheezing, decrease BS on right lung field; no rales  Abdomen: soft, NT, ND, positive BS  Musculoskeletal: no edema, cyanosis or clubbing.  Skin: multiple old bruises on legs and arms; no open wounds.  Data Reviewed: Basic Metabolic Panel:  Recent Labs Lab 12/31/13 1831  NA 138  K 3.4*  CL 98  CO2 25  GLUCOSE 167*  BUN 3*  CREATININE 0.66  CALCIUM 9.0   CBC:  Recent Labs Lab 12/31/13 1831  WBC 20.3*  NEUTROABS 17.2*  HGB 12.8*  HCT 38.1*  MCV 92.3  PLT 457*   Cardiac Enzymes:  Recent Labs Lab 12/31/13 1831  CKTOTAL 42   BNP (last 3 results)  Recent Labs  12/24/13 0409  PROBNP 538.5*    Recent Results (from the past 240 hour(s))  MRSA PCR SCREENING     Status: None   Collection Time    01/01/14  1:51 AM      Result Value Ref Range Status   MRSA by PCR NEGATIVE  NEGATIVE Final   Comment:            The GeneXpert MRSA Assay (FDA     approved for NASAL specimens     only), is one component of a     comprehensive MRSA  colonization     surveillance program. It is not     intended to diagnose MRSA     infection nor to guide or     monitor treatment for     MRSA infections.     Studies: Dg Chest 2 View  12/31/2013   CLINICAL DATA:  Pneumonia.  Recent heart attack.  Smoker  EXAM: CHEST  2 VIEW  COMPARISON:  12/24/2013  FINDINGS: The heart size appears normal. There is been significant interval decrease in aeration to right lung which is felt a likely represent a combination of large pleural effusion and associated atelectasis. Pulmonary vascular congestion is noted.   IMPRESSION: 1. Interval development of large right pleural effusion with associated compressive type atelectasis of the right lung.   Electronically Signed   By: Kerby Moors M.D.   On: 12/31/2013 19:17   Ct Angio Chest Pe W/cm &/or Wo Cm  12/31/2013   CLINICAL DATA:  Shortness of breath.  Large right pleural effusion.  EXAM: CT ANGIOGRAPHY CHEST WITH CONTRAST  TECHNIQUE: Multidetector CT imaging of the chest was performed using the standard protocol during bolus administration of intravenous contrast. Multiplanar CT image reconstructions and MIPs were obtained to evaluate the vascular anatomy.  CONTRAST:  17mL OMNIPAQUE IOHEXOL 350 MG/ML SOLN  COMPARISON:  Chest x-ray, same date.  FINDINGS: Chest wall:  No chest wall mass, supraclavicular or axillary lymphadenopathy. The thyroid gland is normal. The bony thorax is intact. No destructive bone lesions or spinal canal compromise. The sternum is intact.  Mediastinum:  The heart is normal in size. No mediastinal or hilar mass or adenopathy. Borderline scattered lymph nodes. The esophagus is grossly normal. The aorta is normal in caliber. No dissection.  Lungs:  There is a large loculated appearing right pleural effusion occupying a good portion of the right hemi thorax. There is severe compressive atelectasis of the right long which is near complete except for the right upper lobe. No endobronchial lesion or obvious bronchial obstruction. I do not see any obvious enhancement of the pleural to suggest empyema. No pleural nodularity. The left lung is clear except because of the large pleural effusion there is mild mass effect on the heart and mediastinum to the left.  Upper abdomen:  Unremarkable.  Review of the MIP images confirms the above findings.  IMPRESSION: 1. Large loculated appearing right pleural effusion with near complete compressive atelectasis of the right lung. There is also mass effect on the heart and mediastinal structures which are shifted to the  left. I do not see any obvious pleural enhancement but empyema is a possibility. 2. No mediastinal or hilar mass or adenopathy.   Electronically Signed   By: Kalman Jewels M.D.   On: 12/31/2013 21:30    Scheduled Meds: . budesonide (PULMICORT) nebulizer solution  0.25 mg Nebulization BID  . ceFEPime (MAXIPIME) IV  1 g Intravenous Q8H  . guaiFENesin  1,200 mg Oral BID  . heparin subcutaneous  5,000 Units Subcutaneous 3 times per day  . lidocaine  2 patch Transdermal Q24H  . metoprolol tartrate  25 mg Oral BID  . nicotine  14 mg Transdermal Daily  . pantoprazole  40 mg Oral Daily  . pneumococcal 23 valent vaccine  0.5 mL Intramuscular Tomorrow-1000  . polyethylene glycol  17 g Oral Daily  . potassium chloride  40 mEq Oral Q4H  . vancomycin  1,000 mg Intravenous Q12H   Continuous Infusions: . sodium chloride 150 mL/hr at 01/01/14 0620  Principal Problem:   Loculated pleural effusion Active Problems:   Paresthesia of both lower extremities with weakness   Body aches   Chest pain    Time spent: >30 minutes    Barton Dubois  [facility] Pager [phone]. If 7PM-7AM, please contact night-coverage at [email or web address], password [facility] 01/01/2014, 8:57 AM  LOS: 1 day

## 2014-01-02 ENCOUNTER — Inpatient Hospital Stay (HOSPITAL_COMMUNITY): Payer: Self-pay

## 2014-01-02 DIAGNOSIS — R52 Pain, unspecified: Secondary | ICD-10-CM

## 2014-01-02 DIAGNOSIS — A419 Sepsis, unspecified organism: Secondary | ICD-10-CM | POA: Diagnosis present

## 2014-01-02 LAB — BASIC METABOLIC PANEL
Anion gap: 10 (ref 5–15)
BUN: 3 mg/dL — AB (ref 6–23)
CHLORIDE: 103 meq/L (ref 96–112)
CO2: 28 mEq/L (ref 19–32)
Calcium: 8.7 mg/dL (ref 8.4–10.5)
Creatinine, Ser: 0.71 mg/dL (ref 0.50–1.35)
GFR calc Af Amer: >90 mL/min (ref >90)
GFR calc non Af Amer: >90 mL/min (ref >90)
GLUCOSE: 146 mg/dL — AB (ref 70–99)
Potassium: 4 mEq/L (ref 3.7–5.3)
Sodium: 141 mEq/L (ref 137–147)

## 2014-01-02 LAB — CBC
HEMATOCRIT: 36.8 % — AB (ref 39.0–52.0)
HEMOGLOBIN: 12.1 g/dL — AB (ref 13.0–17.0)
MCH: 30.6 pg (ref 26.0–34.0)
MCHC: 32.9 g/dL (ref 30.0–36.0)
MCV: 93.2 fL (ref 78.0–100.0)
Platelets: 452 10*3/uL — ABNORMAL HIGH (ref 150–400)
RBC: 3.95 MIL/uL — ABNORMAL LOW (ref 4.22–5.81)
RDW: 15.3 % (ref 11.5–15.5)
WBC: 12.5 10*3/uL — AB (ref 4.0–10.5)

## 2014-01-02 MED ORDER — ONDANSETRON HCL 4 MG/2ML IJ SOLN
4.0000 mg | Freq: Four times a day (QID) | INTRAMUSCULAR | Status: DC | PRN
Start: 2014-01-02 — End: 2014-01-12
  Administered 2014-01-02 – 2014-01-11 (×3): 4 mg via INTRAVENOUS
  Filled 2014-01-02 (×2): qty 2

## 2014-01-02 MED ORDER — NAPROXEN 375 MG PO TABS
375.0000 mg | ORAL_TABLET | Freq: Three times a day (TID) | ORAL | Status: DC
  Administered 2014-01-02 – 2014-01-11 (×26): 375 mg via ORAL
  Filled 2014-01-02 (×34): qty 1

## 2014-01-02 MED ORDER — KETOROLAC TROMETHAMINE 30 MG/ML IJ SOLN
INTRAMUSCULAR | Status: AC
  Administered 2014-01-02: 30 mg
  Filled 2014-01-02: qty 1

## 2014-01-02 MED ORDER — ONDANSETRON HCL 4 MG/2ML IJ SOLN
INTRAMUSCULAR | Status: AC
  Filled 2014-01-02: qty 2

## 2014-01-02 MED ORDER — ALTEPLASE 50 MG IV SOLR
50.0000 mg | Freq: Once | INTRAVENOUS | Status: AC
  Administered 2014-01-02: 50 mg
  Filled 2014-01-02: qty 50

## 2014-01-02 MED ORDER — CETYLPYRIDINIUM CHLORIDE 0.05 % MT LIQD
7.0000 mL | Freq: Two times a day (BID) | OROMUCOSAL | Status: DC
  Administered 2014-01-02 – 2014-01-11 (×13): 7 mL via OROMUCOSAL

## 2014-01-02 MED ORDER — METOPROLOL TARTRATE 1 MG/ML IV SOLN
5.0000 mg | Freq: Once | INTRAVENOUS | Status: AC
  Administered 2014-01-02: 5 mg via INTRAVENOUS

## 2014-01-02 MED ORDER — MORPHINE SULFATE 2 MG/ML IJ SOLN
2.0000 mg | INTRAMUSCULAR | Status: DC | PRN
  Administered 2014-01-02 (×2): 4 mg via INTRAVENOUS
  Administered 2014-01-02: 2 mg via INTRAVENOUS
  Administered 2014-01-02 (×2): 4 mg via INTRAVENOUS
  Administered 2014-01-03: 2 mg via INTRAVENOUS
  Administered 2014-01-03: 4 mg via INTRAVENOUS
  Administered 2014-01-03: 2 mg via INTRAVENOUS
  Administered 2014-01-03: 4 mg via INTRAVENOUS
  Administered 2014-01-03: 2 mg via INTRAVENOUS
  Administered 2014-01-03: 4 mg via INTRAVENOUS
  Administered 2014-01-03 – 2014-01-04 (×5): 2 mg via INTRAVENOUS
  Administered 2014-01-04: 4 mg via INTRAVENOUS
  Administered 2014-01-05: 2 mg via INTRAVENOUS
  Administered 2014-01-05 – 2014-01-07 (×12): 4 mg via INTRAVENOUS
  Administered 2014-01-07 (×2): 2 mg via INTRAVENOUS
  Administered 2014-01-07 (×2): 4 mg via INTRAVENOUS
  Administered 2014-01-07 (×2): 2 mg via INTRAVENOUS
  Administered 2014-01-07 (×2): 4 mg via INTRAVENOUS
  Administered 2014-01-07: 2 mg via INTRAVENOUS
  Administered 2014-01-08 (×2): 4 mg via INTRAVENOUS
  Administered 2014-01-08: 2 mg via INTRAVENOUS
  Administered 2014-01-08: 4 mg via INTRAVENOUS
  Filled 2014-01-02: qty 2
  Filled 2014-01-02: qty 1
  Filled 2014-01-02 (×3): qty 2
  Filled 2014-01-02: qty 1
  Filled 2014-01-02 (×4): qty 2
  Filled 2014-01-02: qty 1
  Filled 2014-01-02 (×2): qty 2
  Filled 2014-01-02: qty 1
  Filled 2014-01-02: qty 2
  Filled 2014-01-02: qty 1
  Filled 2014-01-02 (×2): qty 2
  Filled 2014-01-02 (×2): qty 1
  Filled 2014-01-02: qty 2
  Filled 2014-01-02: qty 1
  Filled 2014-01-02: qty 2
  Filled 2014-01-02: qty 1
  Filled 2014-01-02 (×2): qty 2
  Filled 2014-01-02: qty 1
  Filled 2014-01-02 (×2): qty 2
  Filled 2014-01-02 (×2): qty 1
  Filled 2014-01-02 (×4): qty 2
  Filled 2014-01-02: qty 1
  Filled 2014-01-02 (×2): qty 2
  Filled 2014-01-02 (×2): qty 1
  Filled 2014-01-02 (×4): qty 2

## 2014-01-02 MED ORDER — KETOROLAC TROMETHAMINE 30 MG/ML IJ SOLN: 30.0000 mg | Freq: Once | INTRAMUSCULAR | Status: AC

## 2014-01-02 MED ORDER — METOPROLOL TARTRATE 1 MG/ML IV SOLN
INTRAVENOUS | Status: AC
  Filled 2014-01-02: qty 5

## 2014-01-02 NOTE — Progress Notes (Signed)
[facility] PROGRESS NOTE  Ralph Chavez XTK:[id number] DOB: 1971-04-09 DOA: 12/31/2013 PCP: No PCP Per Patient  HPI/Subjective: Feels better, still continues to complain about right sided chest pain especially with deep breathing. Had lethargy episode last night, likely secondary to medications.  Assessment/Plan:  Right-sided large pleural effusion and possible empyema -patient was febrile on admission and WBC's in 20,000 range -will continue broad spectrum antibiotics -will start pulmicort and PRN nebulizer treatment for wheezing -continue oxygen supplementation  -PCCM consulted, right-sided thoracentesis with placement of chest tube done on 01/01/2014  Sepsis -Patient has heart rate of 129 and WBC of 20.3 on admission, along with pneumonia consistent with sepsis. -Patient is on broad-spectrum antibiotic, improving. -Currently on stepdown, critical of care involved, continue current Abx regimen.  Leukocytosis -Likely secondary to the pneumonia/right-sided parapneumonic effusion. -Presented with telemetry she is in the range of 20,000, improving now. -Continue a broad-spectrum antibiotics.  Paresthesia -Had extensive workup including CT of the whole spine and a CT of the head. -Also had MRI of thoracic spine showed no evidence of acute events. -Patient complaining about a lot of pain around his left knee likely secondary from fall last time. -I think he cannot bear weight on his left knee simply because of pain. -PT/OT, if continues to have pain unable to bear weight MRI will be done.   Tobacco abuse: cessation counseling provided -start nicotine patch  Anxiety: continue PRN xanax  GERD: continue PPI  Wheezing: had hx of tobacco abuse, but not PFT's to rule out or diagnosed COPD. -cessation counseling provided -started on pulmicort and albuterol -will benefit of PFT's as an outpatient.  Polysubstance abuse: will use librium protocol for withdrawal -patient also  on xanax for anxiety  DVT: heparin  Code Status: Full Family Communication: no family at bedside Disposition Plan: to be determine; remains inpatient and stepdown   Consultants:  PCCM  Procedures:  See below for x-ray reports -CT scan of the chest with large pleural effusion causing mass effect to mediastinum organs; concerns for loculation and possible early empyema   Antibiotics:  Vancomycin 8/17  Zosyn  8/17    Objective: Filed Vitals:   01/02/14 0800  BP: 162/119  Pulse: 106  Temp:   Resp:     Intake/Output Summary (Last 24 hours) at 01/02/14 0811 Last data filed at 01/02/14 0740  Gross per 24 hour  Intake   3860 ml  Output   5875 ml  Net  -2015 ml   Filed Weights   12/31/13 2127 01/01/14 0022 01/02/14 0400  Weight: 100.109 kg (220 lb 11.2 oz) 101.3 kg (223 lb 5.2 oz) 104.7 kg (230 lb 13.2 oz)    Exam:   General:  AAOX3, complaining pleurisy (right side), some difficulty breathing and with intermittent coughing spells.  Cardiovascular: mild tachycardia, no rubs or gallops, S1 and S2, present   Respiratory: exp wheezing, decrease BS on right lung field; no rales  Abdomen: soft, NT, ND, positive BS  Musculoskeletal: no edema, cyanosis or clubbing.  Skin: multiple old bruises on legs and arms; no open wounds.  Data Reviewed: Basic Metabolic Panel:  Recent Labs Lab 12/31/13 1831 01/02/14 0340  NA 138 141  K 3.4* 4.0  CL 98 103  CO2 25 28  GLUCOSE 167* 146*  BUN 3* 3*  CREATININE 0.66 0.71  CALCIUM 9.0 8.7   CBC:  Recent Labs Lab 12/31/13 1831 01/02/14 0340  WBC 20.3* 12.5*  NEUTROABS 17.2*  --   HGB 12.8* 12.1*  HCT  38.1* 36.8*  MCV 92.3 93.2  PLT 457* 452*   Cardiac Enzymes:  Recent Labs Lab 12/31/13 1831  CKTOTAL 42   BNP (last 3 results)  Recent Labs  12/24/13 0409  PROBNP 538.5*    Recent Results (from the past 240 hour(s))  CULTURE, BLOOD (ROUTINE X 2)     Status: None   Collection Time    12/31/13  6:30  PM      Result Value Ref Range Status   Specimen Description BLOOD LEFT WRIST   Final   Special Requests BOTTLES DRAWN AEROBIC AND ANAEROBIC 6 ML   Final   Culture  Setup Time     Final   Value: 12/31/2013 22:37     Performed at [facility]   Culture     Final   Value:        BLOOD CULTURE RECEIVED NO GROWTH TO DATE CULTURE WILL BE HELD FOR 5 DAYS BEFORE ISSUING A FINAL NEGATIVE REPORT     Performed at [facility]   Report Status PENDING   Incomplete  CULTURE, BLOOD (ROUTINE X 2)     Status: None   Collection Time    12/31/13  6:40 PM      Result Value Ref Range Status   Specimen Description BLOOD RIGHT HAND   Final   Special Requests BOTTLES DRAWN AEROBIC AND ANAEROBIC 4 ML   Final   Culture  Setup Time     Final   Value: 12/31/2013 22:38     Performed at [facility]   Culture     Final   Value:        BLOOD CULTURE RECEIVED NO GROWTH TO DATE CULTURE WILL BE HELD FOR 5 DAYS BEFORE ISSUING A FINAL NEGATIVE REPORT     Performed at [facility]   Report Status PENDING   Incomplete  MRSA PCR SCREENING     Status: None   Collection Time    01/01/14  1:51 AM      Result Value Ref Range Status   MRSA by PCR NEGATIVE  NEGATIVE Final   Comment:            The GeneXpert MRSA Assay (FDA     approved for NASAL specimens     only), is one component of a     comprehensive MRSA colonization     surveillance program. It is not     intended to diagnose MRSA     infection nor to guide or     monitor treatment for     MRSA infections.  GRAM STAIN     Status: None   Collection Time    01/01/14 10:05 AM      Result Value Ref Range Status   Specimen Description PLEURAL   Final   Special Requests NONE   Final   Gram Stain     Final   Value: CYTOSPIN     WBC PRESENT,BOTH PMN AND MONONUCLEAR     NO ORGANISMS SEEN     Gram Stain Report Called to,Read Back By and Verified With: A. ARNOLD RN AT 1050 ON 08.18.15 BY SHUEA   Report Status 01/01/2014 FINAL    Final  BODY FLUID CULTURE     Status: None   Collection Time    01/01/14 10:05 AM      Result Value Ref Range Status   Specimen Description PLEURAL   Final   Special Requests Normal   Final  Gram Stain     Final   Value: CYTOSPIN SLIDE WBC PRESENT,BOTH PMN AND MONONUCLEAR     NO ORGANISMS SEEN     Gram Stain Report Called to,Read Back By and Verified With: Gram Stain Report Called to,Read Back By and Verified With: A ARNOLD RN 1050AM 01/01/14 BY SHUEA Performed at [facility]     Performed at [facility]   Culture PENDING   Incomplete   Report Status PENDING   Incomplete     Studies: Dg Chest 2 View  12/31/2013   CLINICAL DATA:  Pneumonia.  Recent heart attack.  Smoker  EXAM: CHEST  2 VIEW  COMPARISON:  12/24/2013  FINDINGS: The heart size appears normal. There is been significant interval decrease in aeration to right lung which is felt a likely represent a combination of large pleural effusion and associated atelectasis. Pulmonary vascular congestion is noted.  IMPRESSION: 1. Interval development of large right pleural effusion with associated compressive type atelectasis of the right lung.   Electronically Signed   By: Kerby Moors M.D.   On: 12/31/2013 19:17   Ct Angio Chest Pe W/cm &/or Wo Cm  12/31/2013   CLINICAL DATA:  Shortness of breath.  Large right pleural effusion.  EXAM: CT ANGIOGRAPHY CHEST WITH CONTRAST  TECHNIQUE: Multidetector CT imaging of the chest was performed using the standard protocol during bolus administration of intravenous contrast. Multiplanar CT image reconstructions and MIPs were obtained to evaluate the vascular anatomy.  CONTRAST:  13mL OMNIPAQUE IOHEXOL 350 MG/ML SOLN  COMPARISON:  Chest x-ray, same date.  FINDINGS: Chest wall:  No chest wall mass, supraclavicular or axillary lymphadenopathy. The thyroid gland is normal. The bony thorax is intact. No destructive bone lesions or spinal canal compromise. The sternum is intact.  Mediastinum:   The heart is normal in size. No mediastinal or hilar mass or adenopathy. Borderline scattered lymph nodes. The esophagus is grossly normal. The aorta is normal in caliber. No dissection.  Lungs:  There is a large loculated appearing right pleural effusion occupying a good portion of the right hemi thorax. There is severe compressive atelectasis of the right long which is near complete except for the right upper lobe. No endobronchial lesion or obvious bronchial obstruction. I do not see any obvious enhancement of the pleural to suggest empyema. No pleural nodularity. The left lung is clear except because of the large pleural effusion there is mild mass effect on the heart and mediastinum to the left.  Upper abdomen:  Unremarkable.  Review of the MIP images confirms the above findings.  IMPRESSION: 1. Large loculated appearing right pleural effusion with near complete compressive atelectasis of the right lung. There is also mass effect on the heart and mediastinal structures which are shifted to the left. I do not see any obvious pleural enhancement but empyema is a possibility. 2. No mediastinal or hilar mass or adenopathy.   Electronically Signed   By: Kalman Jewels M.D.   On: 12/31/2013 21:30   Dg Chest Port 1 View  01/02/2014   CLINICAL DATA:  Right pleural drainage catheter  EXAM: PORTABLE CHEST - 1 VIEW  COMPARISON:  Chest x-ray from yesterday  FINDINGS: A large right pleural effusion, which is loculated by CT, shows slight interval decrease in volume. Leftward mediastinal shift persists. Congested appearance of the left lung vessels likely related to extensive lung compression on the right. A right-sided pleural drainage catheter is in unchanged position, located at the mid to upper  chest level. Stable heart size and mediastinal contours.  IMPRESSION: Mild decrease in a still large right pleural effusion.   Electronically Signed   By: Jorje Guild M.D.   On: 01/02/2014 05:44   Dg Chest Port 1  View  01/01/2014   CLINICAL DATA:  Chest tube placement.  EXAM: PORTABLE CHEST - 1 VIEW  COMPARISON:  CT 12/31/2013  FINDINGS: Right chest tube noted over the right upper chest. Prominent right pleural effusion remains. No pneumothorax. Left lung is clear. Cardiac structures are unremarkable. No acute bony abnormality.  IMPRESSION: Interim placement of right chest tube, its tip is projected over the right upper chest. Persistent large right pleural effusion.   Electronically Signed   By: [place]   On: 01/01/2014 10:18    Scheduled Meds: . ceFEPime (MAXIPIME) IV  1 g Intravenous Q8H  . chlordiazePOXIDE  10 mg Oral TID  . folic acid  1 mg Intravenous Daily  . gabapentin  100 mg Oral TID  . guaiFENesin  1,200 mg Oral BID  . heparin subcutaneous  5,000 Units Subcutaneous 3 times per day  . lidocaine  2 patch Transdermal Q24H  . metoprolol tartrate  25 mg Oral BID  . nicotine  14 mg Transdermal Daily  . pantoprazole  40 mg Oral Daily  . pneumococcal 23 valent vaccine  0.5 mL Intramuscular Tomorrow-1000  . polyethylene glycol  17 g Oral Daily  . thiamine  100 mg Intravenous Daily  . vancomycin  1,000 mg Intravenous Q12H   Continuous Infusions: . sodium chloride 100 mL/hr at 01/01/14 1643    Principal Problem:   Loculated pleural effusion Active Problems:   Paresthesia of both lower extremities with weakness   Body aches   Chest pain    Time spent: >30 minutes    [facility] Pager [phone]. If 7PM-7AM, please contact night-coverage at [email or web address], password [facility] 01/02/2014, 8:11 AM  LOS: 2 days

## 2014-01-02 NOTE — Progress Notes (Signed)
PULMONARY / CRITICAL CARE MEDICINE   Name: Ralph Chavez MRN: [id number] DOB: 09-19-1970    ADMISSION DATE:  12/31/2013 CONSULTATION DATE:  01/01/2014  REFERRING MD :  Dyann Kief TRH  CHIEF COMPLAINT:  Shortness of breath, pain all over  INITIAL PRESENTATION: 43 y/o male with a history of polysubstance abuse admitted on 8/17 with increasing dyspnea and a large R pleural effusion after a recent episode of RLL pneumonia.  STUDIES:  8/18 CT chest > large R pleural effusion with compressive atelectasis of R lung, some left shift noted  SIGNIFICANT EVENTS: 8/06  admitted for inability to walk chest pain, rhabdomyolysis 8/06  MRI Thoracic spine > no acute significant findings in thoracic spine, tiny t8-t9 disc protusion, RLL pneumonia 8/10  LHC > no CAD 8/11  discharged home on augmentin 8/18  Placement of R CT    SUBJECTIVE: RN reports CT not flushed over night, 500 cc drainage since placement.  Pt reports breathing easier, no acute events.  Continues to complain of L knee pain.   VITAL SIGNS: Temp:  [97.9 F (36.6 C)-98.9 F (37.2 C)] 98.3 F (36.8 C) (08/19 0400) Pulse Rate:  [92-121] 106 (08/19 0800) Resp:  [19-31] 22 (08/19 0600) BP: (132-189)/(50-119) 162/119 mmHg (08/19 0800) SpO2:  [89 %-100 %] 97 % (08/19 0800) FiO2 (%):  [100 %] 100 % (08/18 1000) Weight:  [230 lb 13.2 oz (104.7 kg)] 230 lb 13.2 oz (104.7 kg) (08/19 0400)  HEMODYNAMICS:    VENTILATOR SETTINGS: Vent Mode:  [-]  FiO2 (%):  [100 %] 100 %  INTAKE / OUTPUT:  Intake/Output Summary (Last 24 hours) at 01/02/14 0810 Last data filed at 01/02/14 0740  Gross per 24 hour  Intake   3860 ml  Output   5875 ml  Net  -2015 ml    PHYSICAL EXAMINATION: Gen: wdwn adult male in NAD HEENT: NCAT, PERRL, EOMi, OP clear, neck supple without masses PULM: diminished throughout R lung, CTA L CV: tachy, no clear murmur, no JVD, R CT with yellow drainage AB: BS+, soft, nontender, no hsm Ext: warm, no edema, no  clubbing, no cyanosis Derm: multiple extensor surface healed abrasions Neuro: A&Ox4, CN II-XII intact, MAEW   LABS:  CBC  Recent Labs Lab 12/31/13 1831 01/02/14 0340  WBC 20.3* 12.5*  HGB 12.8* 12.1*  HCT 38.1* 36.8*  PLT 457* 452*   Coag's No results found for this basename: APTT, INR,  in the last 168 hours BMET  Recent Labs Lab 12/31/13 1831 01/02/14 0340  NA 138 141  K 3.4* 4.0  CL 98 103  CO2 25 28  BUN 3* 3*  CREATININE 0.66 0.71  GLUCOSE 167* 146*   Electrolytes  Recent Labs Lab 12/31/13 1831 01/02/14 0340  CALCIUM 9.0 8.7   Sepsis Markers  Recent Labs Lab 12/31/13 1840  LATICACIDVEN 2.9*   ABG No results found for this basename: PHART, PCO2ART, PO2ART,  in the last 168 hours Liver Enzymes No results found for this basename: AST, ALT, ALKPHOS, BILITOT, ALBUMIN,  in the last 168 hours Cardiac Enzymes No results found for this basename: TROPONINI, PROBNP,  in the last 168 hours Glucose No results found for this basename: GLUCAP,  in the last 168 hours  Imaging Dg Chest Port 1 View  01/01/2014   CLINICAL DATA:  Chest tube placement.  EXAM: PORTABLE CHEST - 1 VIEW  COMPARISON:  CT 12/31/2013  FINDINGS: Right chest tube noted over the right upper chest. Prominent right pleural effusion remains. No pneumothorax.  Left lung is clear. Cardiac structures are unremarkable. No acute bony abnormality.  IMPRESSION: Interim placement of right chest tube, its tip is projected over the right upper chest. Persistent large right pleural effusion.   Electronically Signed   By: [place]   On: 01/01/2014 10:18     ASSESSMENT / PLAN:  PULMONARY OETT N/A A:  Acute R parapneumonic effusion > appears complicated/loculated on CT, s/p pigtail cath placement HCAP Acute hypoxemic respiratory failure due to the above problems P:   Flush pigtail per protocol  Plan for TPA via CT 8/19 CXR in AM Titrate supplemental O2 for O2 saturation > 92% See  ID incentive spirometry  flutter valve  CARDIOVASCULAR A:  Sinus tachycardia due to pain P:  tele  RENAL A:   Hypokalemia P:   Daily BMET Monitor UOP Replace electrolytes as needed  GASTROINTESTINAL A:   No acute issues P:   Advance diet  HEMATOLOGIC A:   Thrombocytosis > reactive P:  -monitor CBC  INFECTIOUS A:   HCAP with parapneumonic effusion IV drug abuse BCx2 8/17 > Sputum 8/17 > Plural Fluid Culture >  P:   Vanc 8/17 >  Cefepime 8/17 >  Will narrow antibiotics and plan duration based on culture results Watch blood cultures closely with history of IV drug abuse  ENDOCRINE A:   No acute issues P:   monitor glucose  NEUROLOGIC A:   Agitation, pain  EtOH abuse P:   Thiamine/folate daily Librium PRN narcotics per primary service  TODAY'S SUMMARY:  43 y/o male with polysubstance abuse here with acute hypoxemic respiratory failure due to a parapneumonic effusion related to HCAP.  S/p CT placement on 8/19 with 500 ml returned last 24 hours.  Plan for TPA.  Assess L knee xray.   Titrate O2, monitor closely in ICU   Noe Gens, NP-C [place] Pgr: [phone] or [phone]   Attending:  I have seen and examined the patient with nurse practitioner/resident and agree with the note above.   Flush pigtail per orderset, otherwise it doesn't work TPA in R chest today to break up loculated fluid May need VATS if no improvement in drainage  Roselie Awkward, MD [place] [facility] Pager: [phone] Cell: [phone] If no response, call [phone]    01/02/2014, 8:10 AM

## 2014-01-02 NOTE — Progress Notes (Signed)
[facility] PCCM  Instilled 50mg  TPA into R sided pigtail drain at 10AM Tube clamped, will unclamp at 12 noon  Roselie Awkward, MD [place] [facility] Pager: [phone] Cell: [phone] If no response, call [phone]

## 2014-01-02 NOTE — Progress Notes (Signed)
CSW received referral that pt homeless.   CSW visited unit and spoke with RN. Per RN, pt has had difficult day and has just gotten settled and fallen asleep and RN feels that it would be best for CSW to follow up at another time.   CSW to follow up at another time and complete full psychosocial assessment at that time.   Alison Murray, MSW, [place] Work [phone]

## 2014-01-03 ENCOUNTER — Inpatient Hospital Stay (HOSPITAL_COMMUNITY): Payer: Self-pay

## 2014-01-03 ENCOUNTER — Inpatient Hospital Stay (HOSPITAL_COMMUNITY): Payer: MEDICAID

## 2014-01-03 LAB — BASIC METABOLIC PANEL
Anion gap: 10 (ref 5–15)
BUN: 4 mg/dL — ABNORMAL LOW (ref 6–23)
CO2: 28 mEq/L (ref 19–32)
CREATININE: 0.64 mg/dL (ref 0.50–1.35)
Calcium: 8.9 mg/dL (ref 8.4–10.5)
Chloride: 97 mEq/L (ref 96–112)
Glucose, Bld: 129 mg/dL — ABNORMAL HIGH (ref 70–99)
Potassium: 4.1 mEq/L (ref 3.7–5.3)
Sodium: 135 mEq/L — ABNORMAL LOW (ref 137–147)

## 2014-01-03 LAB — VANCOMYCIN, TROUGH

## 2014-01-03 LAB — CBC
HCT: 43.3 % (ref 39.0–52.0)
HEMOGLOBIN: 13.9 g/dL (ref 13.0–17.0)
MCH: 30.2 pg (ref 26.0–34.0)
MCHC: 32.1 g/dL (ref 30.0–36.0)
MCV: 94.1 fL (ref 78.0–100.0)
PLATELETS: 422 10*3/uL — AB (ref 150–400)
RBC: 4.6 MIL/uL (ref 4.22–5.81)
RDW: 15.2 % (ref 11.5–15.5)
WBC: 15.4 10*3/uL — ABNORMAL HIGH (ref 4.0–10.5)

## 2014-01-03 MED ORDER — VANCOMYCIN HCL IN DEXTROSE 1-5 GM/200ML-% IV SOLN
1000.0000 mg | Freq: Three times a day (TID) | INTRAVENOUS | Status: DC
  Filled 2014-01-03: qty 200

## 2014-01-03 NOTE — Progress Notes (Signed)
PULMONARY / CRITICAL CARE MEDICINE   Name: Ralph Chavez MRN: [id number] DOB: Apr 14, 1971    ADMISSION DATE:  12/31/2013 CONSULTATION DATE:  01/01/2014  REFERRING MD :  Dyann Kief TRH  CHIEF COMPLAINT:  Shortness of breath, pain all over  INITIAL PRESENTATION: 43 y/o male with a history of polysubstance abuse admitted on 8/17 with increasing dyspnea and a large R pleural effusion after a recent episode of RLL pneumonia.  STUDIES:  8/18 CT chest > large R pleural effusion with compressive atelectasis of R lung, some left shift noted  SIGNIFICANT EVENTS: 8/06  admitted for inability to walk chest pain, rhabdomyolysis 8/06  MRI Thoracic spine > no acute significant findings in thoracic spine, tiny t8-t9 disc protusion, RLL pneumonia 8/10  LHC > no CAD 8/11  discharged home on augmentin 8/18  Placement of R CT 8/19  50mg  TPA intrapleural 8/ 20  Improved CXR post placement of TPA.      SUBJECTIVE: Pt continues to report L knee pain (neg plain film).  Improved SOB.  No acute events.  Increased CT drainage ~ 2531mL in last 24 hours  VITAL SIGNS: Temp:  [98 F (36.7 C)-99.1 F (37.3 C)] 98.6 F (37 C) (08/20 0400) Pulse Rate:  [104-124] 115 (08/20 0400) Resp:  [15-33] 18 (08/20 0400) BP: (131-180)/(73-101) 131/73 mmHg (08/20 0400) SpO2:  [88 %-100 %] 93 % (08/20 0400) Weight:  [227 lb 15.3 oz (103.4 kg)] 227 lb 15.3 oz (103.4 kg) (08/20 0513)  HEMODYNAMICS:    VENTILATOR SETTINGS:    INTAKE / OUTPUT:  Intake/Output Summary (Last 24 hours) at 01/03/14 0801 Last data filed at 01/03/14 0600  Gross per 24 hour  Intake   3480 ml  Output   7800 ml  Net  -4320 ml    PHYSICAL EXAMINATION: Gen: wdwn adult male in NAD HEENT: NCAT, PERRL, EOMi, OP clear, neck supple without masses PULM: diminished throughout R, improved air movement R upper,  CTA L CV: tachy, no clear murmur, no JVD, R CT with serosanguineous drainage AB: BS+, soft, nontender, no hsm Ext: warm, no edema, no  clubbing, no cyanosis Derm: multiple extensor surface healed abrasions Neuro: A&Ox4, CN II-XII intact, MAEW   LABS:  CBC  Recent Labs Lab 12/31/13 1831 01/02/14 0340 01/03/14 0345  WBC 20.3* 12.5* 15.4*  HGB 12.8* 12.1* 13.9  HCT 38.1* 36.8* 43.3  PLT 457* 452* 422*   BMET  Recent Labs Lab 12/31/13 1831 01/02/14 0340 01/03/14 0345  NA 138 141 135*  K 3.4* 4.0 4.1  CL 98 103 97  CO2 25 28 28   BUN 3* 3* 4*  CREATININE 0.66 0.71 0.64  GLUCOSE 167* 146* 129*   Electrolytes  Recent Labs Lab 12/31/13 1831 01/02/14 0340 01/03/14 0345  CALCIUM 9.0 8.7 8.9   Sepsis Markers  Recent Labs Lab 12/31/13 1840  LATICACIDVEN 2.9*    Imaging Dg Chest Port 1 View  01/02/2014   CLINICAL DATA:  Right chest tube placement  EXAM: PORTABLE CHEST - 1 VIEW  COMPARISON:  01/02/2014  FINDINGS: Large right pleural effusion is unchanged. Right lower lobe is collapsed. Small amount of aerated right upper lobe unchanged. Pigtail catheter overlying the right chest is unchanged. No pneumothorax.  Left lung is clear.  Cardiac enlargement.  IMPRESSION: Large right effusion and collapse of the right lower lobe unchanged. Right pigtail catheter unchanged in position.   Electronically Signed   By: Franchot Gallo M.D.   On: 01/02/2014 14:24   Dg Chest Port 1  View  01/02/2014   CLINICAL DATA:  Right pleural drainage catheter  EXAM: PORTABLE CHEST - 1 VIEW  COMPARISON:  Chest x-ray from yesterday  FINDINGS: A large right pleural effusion, which is loculated by CT, shows slight interval decrease in volume. Leftward mediastinal shift persists. Congested appearance of the left lung vessels likely related to extensive lung compression on the right. A right-sided pleural drainage catheter is in unchanged position, located at the mid to upper chest level. Stable heart size and mediastinal contours.  IMPRESSION: Mild decrease in a still large right pleural effusion.   Electronically Signed   By: Jorje Guild M.D.   On: 01/02/2014 05:44   Dg Knee Left Port  01/02/2014   CLINICAL DATA:  Left knee pain after fall.  EXAM: PORTABLE LEFT KNEE - 1-2 VIEW  COMPARISON:  December 20, 2013.  FINDINGS: There is no evidence of fracture, dislocation, or joint effusion. There is no evidence of arthropathy or other focal bone abnormality. Soft tissues are unremarkable.  IMPRESSION: Normal left knee.   Electronically Signed   By: Sabino Dick M.D.   On: 01/02/2014 11:42     ASSESSMENT / PLAN:  PULMONARY OETT N/A A:  Acute R parapneumonic effusion > appears complicated/loculated on CT, s/p pigtail cath placement & TPA administration.   HCAP Acute hypoxemic respiratory failure due to the above problems P:   Flush pigtail per protocol  CXR in AM Titrate supplemental O2 for O2 saturation > 92% See ID Pulmonary hygiene: IS / Flutter valve   CARDIOVASCULAR A:  Sinus tachycardia due to pain P:  Tele Pain control  RENAL A:   Hypokalemia P:   Daily BMET Monitor UOP Replace electrolytes as needed  GASTROINTESTINAL A:   No acute issues P:   Advance diet as tolerated  HEMATOLOGIC A:   Thrombocytosis > reactive P:  -monitor CBC  INFECTIOUS A:   HCAP with parapneumonic effusion IV drug abuse BCx2 8/17 > Sputum 8/17 > Plural Fluid Culture >  P:   Vanc 8/17 > 8/20 Cefepime 8/17 >  D/C vanc, consider switch to Levaquin alone on 8/21 if continues to stabilize Watch blood cultures closely with history of IV drug abuse  ENDOCRINE A:   No acute issues P:   Monitor glucose  NEUROLOGIC A:   Agitation, pain  EtOH abuse Knee Pain P:   Thiamine/folate daily Librium PRN narcotics per primary service Negative plain films of L Knee, consider Ortho as outpatient  TODAY'S SUMMARY:  43 y/o male with polysubstance abuse here with acute hypoxemic respiratory failure due to a parapneumonic effusion related to HCAP.  S/p CT placement on 8/19 with nearly 3 liters returned last 24 hours post  TPA administration.  Titrate O2, monitor closely in ICU   Noe Gens, NP-C [place] Pgr: [phone] or [phone]   01/03/2014, 8:01 AM  Attending:  I have seen and examined the patient with nurse practitioner/resident and agree with the note above.   Effusion improving significantly after administering intrapleural TPA yesterday. Out of bed today Narrow antibiotics Hopefully we can remove pigtail tomorrow  Roselie Awkward, MD [place] [facility] Pager: [phone] Cell: [phone] If no response, call [phone]

## 2014-01-03 NOTE — Progress Notes (Signed)
ANTIBIOTIC CONSULT NOTE - INITIAL  Pharmacy Consult for Vancomycin Indication: PNA  No Known Allergies  Patient Measurements: Height: 5\' 10"  (177.8 cm) Weight: 227 lb 15.3 oz (103.4 kg) IBW/kg (Calculated) : 73 Adjusted Body Weight:   Vital Signs: Temp: 98.6 F (37 C) (08/20 0400) Temp src: Oral (08/20 0400) BP: 131/73 mmHg (08/20 0400) Pulse Rate: 115 (08/20 0400) Intake/Output from previous day: 08/19 0701 - 08/20 0700 In: 3780 [P.O.:1200; I.V.:2000; IV Piggyback:550] Out: 84 [Urine:5400; Chest Tube:2800] Intake/Output from this shift:    Labs:  Recent Labs  12/31/13 1831 01/02/14 0340 01/03/14 0345  WBC 20.3* 12.5* 15.4*  HGB 12.8* 12.1* 13.9  PLT 457* 452* 422*  CREATININE 0.66 0.71 0.64   Estimated Creatinine Clearance: 146.4 ml/min (by C-G formula based on Cr of 0.64).  Recent Labs  01/03/14 0642  [place] <5.0*     Microbiology: Recent Results (from the past 720 hour(s))  MRSA PCR SCREENING     Status: None   Collection Time    12/20/13  7:56 AM      Result Value Ref Range Status   MRSA by PCR NEGATIVE  NEGATIVE Final   Comment:            The GeneXpert MRSA Assay (FDA     approved for NASAL specimens     only), is one component of a     comprehensive MRSA colonization     surveillance program. It is not     intended to diagnose MRSA     infection nor to guide or     monitor treatment for     MRSA infections.  CULTURE, EXPECTORATED SPUTUM-ASSESSMENT     Status: None   Collection Time    12/21/13  9:15 AM      Result Value Ref Range Status   Specimen Description SPUTUM   Final   Special Requests NONE   Final   Sputum evaluation     Final   Value: MICROSCOPIC FINDINGS SUGGEST THAT THIS SPECIMEN IS NOT REPRESENTATIVE OF LOWER RESPIRATORY SECRETIONS. PLEASE RECOLLECT.     Gram Stain Report Called to,Read Back By and Verified With: Alafaya, M AT 0942 ON 12/21/13 BY HOBBINS, J.   Report Status 12/21/2013 FINAL   Final  CULTURE, BLOOD  (ROUTINE X 2)     Status: None   Collection Time    12/31/13  6:30 PM      Result Value Ref Range Status   Specimen Description BLOOD LEFT WRIST   Final   Special Requests BOTTLES DRAWN AEROBIC AND ANAEROBIC 6 ML   Final   Culture  Setup Time     Final   Value: 12/31/2013 22:37     Performed at [facility]   Culture     Final   Value:        BLOOD CULTURE RECEIVED NO GROWTH TO DATE CULTURE WILL BE HELD FOR 5 DAYS BEFORE ISSUING A FINAL NEGATIVE REPORT     Performed at [facility]   Report Status PENDING   Incomplete  CULTURE, BLOOD (ROUTINE X 2)     Status: None   Collection Time    12/31/13  6:40 PM      Result Value Ref Range Status   Specimen Description BLOOD RIGHT HAND   Final   Special Requests BOTTLES DRAWN AEROBIC AND ANAEROBIC 4 ML   Final   Culture  Setup Time     Final   Value: 12/31/2013 22:38  Performed at [facility]     Final   Value:        BLOOD CULTURE RECEIVED NO GROWTH TO DATE CULTURE WILL BE HELD FOR 5 DAYS BEFORE ISSUING A FINAL NEGATIVE REPORT     Performed at [facility]   Report Status PENDING   Incomplete  MRSA PCR SCREENING     Status: None   Collection Time    01/01/14  1:51 AM      Result Value Ref Range Status   MRSA by PCR NEGATIVE  NEGATIVE Final   Comment:            The GeneXpert MRSA Assay (FDA     approved for NASAL specimens     only), is one component of a     comprehensive MRSA colonization     surveillance program. It is not     intended to diagnose MRSA     infection nor to guide or     monitor treatment for     MRSA infections.  GRAM STAIN     Status: None   Collection Time    01/01/14 10:05 AM      Result Value Ref Range Status   Specimen Description PLEURAL   Final   Special Requests NONE   Final   Gram Stain     Final   Value: CYTOSPIN     WBC PRESENT,BOTH PMN AND MONONUCLEAR     NO ORGANISMS SEEN     Gram Stain Report Called to,Read Back By and Verified With: A. ARNOLD RN  AT 1050 ON 08.18.15 BY SHUEA   Report Status 01/01/2014 FINAL   Final  BODY FLUID CULTURE     Status: None   Collection Time    01/01/14 10:05 AM      Result Value Ref Range Status   Specimen Description PLEURAL   Final   Special Requests Normal   Final   Gram Stain     Final   Value: CYTOSPIN SLIDE WBC PRESENT,BOTH PMN AND MONONUCLEAR     NO ORGANISMS SEEN     Gram Stain Report Called to,Read Back By and Verified With: Gram Stain Report Called to,Read Back By and Verified With: A ARNOLD RN 1050AM 01/01/14 BY SHUEA Performed at [facility]     Performed at [facility]   Culture     Final   Value: NO GROWTH 1 DAY     Performed at [facility]   Report Status PENDING   Incomplete    Medical History: Past Medical History  Diagnosis Date  . Anxiety   . Ulcer    Assessment: 25 yoM with hx polysubstance abuse and recent admission for rhabdomyolysis and aspiration PNA presents to [facility] on 8/17 with possible recurrent PNA vs pleural effusion.  MD starting cefepime and pharmacy consulted to dose vancomycin. First doses of zosyn 3.375g x1 and  vancomycin 1500mg  IV x 1 given in ED already.   8/17 >> Zosyn x 1 8/17 >> Vancomycin >> 8/17 >> Cefepime >>   Tmax: AF WBCs: 15.4  Renal: SCr 0.64, CrCl >100 (N and CG)  8/17 blood: ngtd 8/17 MRSA screen neg 8/17 strep pneumo ur ag: neg 8/17 legionella ur ag: in process 8/18 pleural fluid culture: ngtd 8/18 pleural gram stain: ng F  Drug/Dose levels:  8/20 VT 0700: < 5 on vanc 1g Q12H. (SUBtherapeutic)  Goal of Therapy:  Vancomycin trough level 15-20 mcg/ml  Plan:  Increase Vancomycin to 1g IV q8h Vanc trough at [facility] as appropriate  Kizzie Furnish, PharmD Pager: [phone] 01/03/2014 8:10 AM

## 2014-01-04 ENCOUNTER — Inpatient Hospital Stay (HOSPITAL_COMMUNITY): Payer: Self-pay

## 2014-01-04 LAB — BODY FLUID CELL COUNT WITH DIFFERENTIAL
Eos, Fluid: 0 %
Lymphs, Fluid: 21 %
Monocyte-Macrophage-Serous Fluid: 13 % — ABNORMAL LOW (ref 50–90)
NEUTROPHIL FLUID: 66 % — AB (ref 0–25)
Total Nucleated Cell Count, Fluid: 2405 cu mm — ABNORMAL HIGH (ref 0–1000)

## 2014-01-04 LAB — CBC WITH DIFFERENTIAL/PLATELET
Basophils Absolute: 0 10*3/uL (ref 0.0–0.1)
Basophils Relative: 0 % (ref 0–1)
EOS PCT: 2 % (ref 0–5)
Eosinophils Absolute: 0.2 10*3/uL (ref 0.0–0.7)
HCT: 35.6 % — ABNORMAL LOW (ref 39.0–52.0)
Hemoglobin: 12 g/dL — ABNORMAL LOW (ref 13.0–17.0)
LYMPHS PCT: 9 % — AB (ref 12–46)
Lymphs Abs: 0.9 10*3/uL (ref 0.7–4.0)
MCH: 30.8 pg (ref 26.0–34.0)
MCHC: 33.7 g/dL (ref 30.0–36.0)
MCV: 91.5 fL (ref 78.0–100.0)
Monocytes Absolute: 0.7 10*3/uL (ref 0.1–1.0)
Monocytes Relative: 7 % (ref 3–12)
NEUTROS PCT: 82 % — AB (ref 43–77)
Neutro Abs: 8.6 10*3/uL — ABNORMAL HIGH (ref 1.7–7.7)
PLATELETS: 433 10*3/uL — AB (ref 150–400)
RBC: 3.89 MIL/uL — AB (ref 4.22–5.81)
RDW: 15 % (ref 11.5–15.5)
WBC: 10.4 10*3/uL (ref 4.0–10.5)

## 2014-01-04 LAB — BASIC METABOLIC PANEL
ANION GAP: 9 (ref 5–15)
BUN: 6 mg/dL (ref 6–23)
CHLORIDE: 101 meq/L (ref 96–112)
CO2: 28 mEq/L (ref 19–32)
Calcium: 8.4 mg/dL (ref 8.4–10.5)
Creatinine, Ser: 0.67 mg/dL (ref 0.50–1.35)
GFR calc non Af Amer: >90 mL/min (ref >90)
Glucose, Bld: 127 mg/dL — ABNORMAL HIGH (ref 70–99)
POTASSIUM: 3.5 meq/L — AB (ref 3.7–5.3)
Sodium: 138 mEq/L (ref 137–147)

## 2014-01-04 LAB — LACTATE DEHYDROGENASE, PLEURAL OR PERITONEAL FLUID: LD, Fluid: 247 U/L — ABNORMAL HIGH (ref 3–23)

## 2014-01-04 LAB — PROTEIN, BODY FLUID: Total protein, fluid: 3.8 g/dL

## 2014-01-04 LAB — BODY FLUID CULTURE
Culture: NO GROWTH
Special Requests: NORMAL

## 2014-01-04 LAB — PROTEIN, TOTAL: Total Protein: 5.5 g/dL — ABNORMAL LOW (ref 6.0–8.3)

## 2014-01-04 LAB — LACTATE DEHYDROGENASE: LDH: 78 U/L — ABNORMAL LOW (ref 94–250)

## 2014-01-04 MED ORDER — CHLORDIAZEPOXIDE HCL 5 MG PO CAPS
10.0000 mg | ORAL_CAPSULE | Freq: Two times a day (BID) | ORAL | Status: DC
  Administered 2014-01-04 – 2014-01-11 (×15): 10 mg via ORAL
  Filled 2014-01-04: qty 1
  Filled 2014-01-04: qty 2
  Filled 2014-01-04 (×3): qty 1
  Filled 2014-01-04: qty 2
  Filled 2014-01-04 (×3): qty 1
  Filled 2014-01-04 (×3): qty 2
  Filled 2014-01-04 (×3): qty 1

## 2014-01-04 MED ORDER — IOHEXOL 300 MG/ML  SOLN
100.0000 mL | Freq: Once | INTRAMUSCULAR | Status: AC | PRN
  Administered 2014-01-04: 80 mL via INTRAVENOUS

## 2014-01-04 NOTE — Progress Notes (Signed)
CSW continuing to follow for referral for homelessness.  CSW attempted to meet with pt at bedside, but pt lights off at this time and pt resting soundly.  CSW noted that RNCM was able to meet with pt earlier to provide resources.  CSW will continue attempts to meet with pt to complete assessment for reported homelessness.  CSW to continue to follow.  Alison Murray, MSW, [place] [employer] [phone]

## 2014-01-04 NOTE — Progress Notes (Signed)
PULMONARY / CRITICAL CARE MEDICINE   Name: Ralph Chavez MRN: [id number] DOB: [birth year]    ADMISSION DATE:  12/31/2013 CONSULTATION DATE:  01/01/2014  REFERRING MD :  Dyann Kief TRH  CHIEF COMPLAINT:  Shortness of breath, pain all over  INITIAL PRESENTATION: 43 y/o male with a history of polysubstance abuse admitted on 8/17 with increasing dyspnea and a large R pleural effusion after a recent episode of RLL pneumonia.  STUDIES:  8/18 CT chest > large R pleural effusion with compressive atelectasis of R lung, some left shift noted  SIGNIFICANT EVENTS: 8/06  admitted for inability to walk chest pain, rhabdomyolysis 8/06  MRI Thoracic spine > no acute significant findings in thoracic spine, tiny t8-t9 disc protusion, RLL pneumonia 8/10  LHC > no CAD 8/11  discharged home on augmentin 8/18  Placement of R CT 8/19  50mg  TPA intrapleural 8/20  Improved CXR post placement of TPA  8/21  R thora > 750cc removed   SUBJECTIVE: Pt continues to report L knee pain (neg plain film).  Improved SOB.   Drainage from pigtail drain tapering off  VITAL SIGNS: Temp:  [97.4 F (36.3 C)-98.3 F (36.8 C)] 97.4 F (36.3 C) (08/21 0751) Pulse Rate:  [96-124] 103 (08/21 0645) Resp:  [17-32] 21 (08/21 0645) BP: (128-161)/(75-96) 137/96 mmHg (08/21 0645) SpO2:  [77 %-98 %] 93 % (08/21 0645) Weight:  [101.7 kg (224 lb 3.3 oz)] 101.7 kg (224 lb 3.3 oz) (08/21 0500)  HEMODYNAMICS:    VENTILATOR SETTINGS:    INTAKE / OUTPUT:  Intake/Output Summary (Last 24 hours) at 01/04/14 1027 Last data filed at 01/04/14 0956  Gross per 24 hour  Intake   2655 ml  Output   7090 ml  Net  -4435 ml    PHYSICAL EXAMINATION: Gen: sitting up in chair, comfortable HEENT: NCAT, EOMi PULM: diminished on R, CTA on left CV: Tachy, regular, no mgr AB: BS+, soft, nontender Ext: warm, no edema Neuro: Awake and alert today, maew   LABS:  CBC  Recent Labs Lab 12/31/13 1831 01/02/14 0340 01/03/14 0345  WBC  20.3* 12.5* 15.4*  HGB 12.8* 12.1* 13.9  HCT 38.1* 36.8* 43.3  PLT 457* 452* 422*   BMET  Recent Labs Lab 12/31/13 1831 01/02/14 0340 01/03/14 0345  NA 138 141 135*  K 3.4* 4.0 4.1  CL 98 103 97  CO2 25 28 28   BUN 3* 3* 4*  CREATININE 0.66 0.71 0.64  GLUCOSE 167* 146* 129*   Electrolytes  Recent Labs Lab 12/31/13 1831 01/02/14 0340 01/03/14 0345  CALCIUM 9.0 8.7 8.9   Sepsis Markers  Recent Labs Lab 12/31/13 1840  LATICACIDVEN 2.9*    Imaging Dg Chest Port 1 View  01/03/2014   CLINICAL DATA:  Right pleural effusion  EXAM: PORTABLE CHEST - 1 VIEW  COMPARISON:  Chest x-ray from yesterday  FINDINGS: Significant decrease in a right pleural effusion. The pleural fluid volume is now moderate, located along the lower and lateral right chest. Some fissural fluid is present. There is no overt re-expansion pulmonary edema. There has been relatively good re-expansion of the right lung. Streaky retrocardiac opacity, similar to previous.  Unchanged cardiomegaly. Stable upper mediastinal contours, likely with loculated pleural fluid along the peritracheal stripe.  IMPRESSION: Significant decrease in right pleural effusion with good re-expansion of the right lung.   Electronically Signed   By: Jorje Guild M.D.   On: 01/03/2014 05:34     ASSESSMENT / PLAN:  PULMONARY OETT N/A A:  Acute R parapneumonic effusion > greatly improved with TPA (>3 liters), s/p removal of 700cc on thora 8/21 HCAP Acute hypoxemic respiratory failure > nearly resolved P:   Flush pigtail per protocol  CXR in AM Titrate supplemental O2 for O2 saturation > 92% See ID Pulmonary hygiene: IS / Flutter valve  CT chest > size of remaining effusion? Loculation? F/u thora labs 8/21 Consider removing pigtail chest tube 8/22  CARDIOVASCULAR A:  Sinus tachycardia due to pain P:  Tele Pain control  RENAL A:   Hypokalemia P:   BMET today Monitor UOP Replace electrolytes as  needed  GASTROINTESTINAL A:   No acute issues P:   Advance diet as tolerated  HEMATOLOGIC A:   Thrombocytosis > reactive P:  -monitor CBC  INFECTIOUS A:   HCAP with parapneumonic effusion IV drug abuse BCx2 8/17 > Sputum 8/17 > Pleural Fluid Culture 8/18 >  NGTD Pleural Fluid Culture 8/21 > P:   Vanc 8/17 > 8/20 Cefepime 8/17 >  Watch blood cultures closely with history of IV drug abuse  ENDOCRINE A:   No acute issues P:   Monitor glucose  NEUROLOGIC A:   Agitation, pain  EtOH abuse Knee Pain P:   Thiamine/folate daily Wean Librium PRN narcotics per primary service Negative plain films of L Knee, consider Ortho/MRI as outpatient  TODAY'S SUMMARY:  43 y/o male with polysubstance abuse, heroin use, and parapneumonic effusion.  Nearly completely drained, still some residual fluid.  Check CT chest on 8/21 to see degree of remaining fluid.  Maintain pigtail drainage for now   Roselie Awkward, MD [place] [facility] Pager: [phone] Cell: [phone] If no response, call [phone]

## 2014-01-04 NOTE — Progress Notes (Signed)
Clinical Social Work Department BRIEF PSYCHOSOCIAL ASSESSMENT 01/04/2014  Patient:  Ralph Chavez, Ralph Chavez     Account Number:  [id number]     Admit date:  12/31/2013  Clinical Social Worker:  Ulyess Blossom  Date/Time:  01/04/2014 04:00 PM  Referred by:  Physician  Date Referred:  01/04/2014 Referred for  Homelessness   Other Referral:   Interview type:  Patient Other interview type:    PSYCHOSOCIAL DATA Living Status:  FRIEND(S) Admitted from facility:   Level of care:   Primary support name:  Marily Lente Primary support relationship to patient:  FRIEND Degree of support available:   lacking    CURRENT CONCERNS Current Concerns  Other - See comment   Other Concerns:    SOCIAL WORK ASSESSMENT / PLAN CSW received referral for homeless issues.    CSW met with pt at bedside. CSW introduced self and explained role. Pt shared that he is homeless. Pt stated that he was living at a motel prior to admission. CSW explored with pt resources including homeless shelters and [facility] and hot meals. Pt states that he is unable to reside in a homeless shelter due to past convictions. Pt stated that he does not feel [facility] is helpful. Pt states the he knows where he is able to get hot meals. CSW expressed understanding and stated that CSW will continue to follow to provide support as necessary.    CSW to continue to follow.   Assessment/plan status:  Psychosocial Support/Ongoing Assessment of Needs Other assessment/ plan:   discharge planning   Information/referral to community resources:   none at this time as pt reports that he is familiar with resources    PATIENT'S/FAMILY'S RESPONSE TO PLAN OF CARE: Pt alert and oriented x 4. Pt states that he is in pain. Pt openly discusses homelessness, but not receptive when CSW discussed resources and states, "who asked you to come again?".    Alison Murray, MSW, [place] [facility] [phone]

## 2014-01-04 NOTE — Procedures (Signed)
Thoracentesis Procedure Note  Pre-operative Diagnosis: pleural effusion  Post-operative Diagnosis: same  Indications: persistent pleural effusion  Procedure Details  Consent: Informed consent was obtained. Risks of the procedure were discussed including: infection, bleeding, pain, pneumothorax.  Under sterile conditions the patient was positioned. Betadine solution and sterile drapes were utilized.  1% buffered lidocaine was used to anesthetize the 9th rib space. Fluid was obtained without any difficulties and minimal blood loss.  A dressing was applied to the wound and wound care instructions were provided.   Findings 750 ml of serosanginous pleural fluid was obtained. A sample was sent to Pathology for cytogenetics, flow, and cell counts, as well as for infection analysis.  Complications:  None; patient tolerated the procedure well.          Condition: stable  Plan A follow up chest x-ray was ordered. Bed Rest for 1 hours. Tylenol 650 mg. for pain.  Attending Attestation: I performed the procedure.  Roselie Awkward, MD [place] [facility] Pager: [phone] Cell: [phone] If no response, call [phone]

## 2014-01-04 NOTE — Care Management Note (Signed)
    Page 1 of 2   01/04/2014     12:29:59 PM CARE MANAGEMENT NOTE 01/04/2014  Patient:  Ralph Chavez, Ralph Chavez   Account Number:  [id number]  Date Initiated:  01/01/2014  Documentation initiated by:  DAVIS,RHONDA  Subjective/Objective Assessment:   pt with hx of polysubstance abuse/presented with increased wob films reveals a large pl.effusion with compression of the rt lung and shirting of the cardiac structures to the right     Action/Plan:   home when stable/pt did not follow up with plans after last and most recent admission/from previous [employer]   Anticipated DC Date:  01/07/2014   Anticipated DC Plan:  HOME/SELF CARE  In-house referral  Clinical Social Worker  Development worker, community      DC Planning Services  CM consult      [facility] Choice  NA   Choice offered to / List presented to:  NA      DME agency  NA     [place] arranged  NA      [place] agency  NA   Status of service:  In process, will continue to follow Medicare Important Message given?  NA - LOS <3 / Initial given by admissions (If response is "NO", the following Medicare IM given date fields will be blank) Date Medicare IM given:   Medicare IM given by:   Date Additional Medicare IM given:   Additional Medicare IM given by:    Discharge Disposition:    Per UR Regulation:  Reviewed for med. necessity/level of care/duration of stay  If discussed at [place] of Stay Meetings, dates discussed:    Comments:  01/04/14 Ociel Retherford RN,BSN NCM [phone] THORACENTESIS.  [id number] to have chest tube insertion due to extremely large plueral effusion and compression of the rt lung and left shifting of the cardiac structures. Rhonda Davis,RN,BSN,CCM

## 2014-01-05 DIAGNOSIS — J869 Pyothorax without fistula: Secondary | ICD-10-CM

## 2014-01-05 LAB — BASIC METABOLIC PANEL
Anion gap: 9 (ref 5–15)
BUN: 4 mg/dL — AB (ref 6–23)
CO2: 29 mEq/L (ref 19–32)
CREATININE: 0.63 mg/dL (ref 0.50–1.35)
Calcium: 8.2 mg/dL — ABNORMAL LOW (ref 8.4–10.5)
Chloride: 100 mEq/L (ref 96–112)
GFR calc non Af Amer: >90 mL/min (ref >90)
Glucose, Bld: 130 mg/dL — ABNORMAL HIGH (ref 70–99)
Potassium: 3.7 mEq/L (ref 3.7–5.3)
Sodium: 138 mEq/L (ref 137–147)

## 2014-01-05 NOTE — Progress Notes (Signed)
PULMONARY / CRITICAL CARE MEDICINE   Name: Ralph Chavez MRN: [id number] DOB: 1971-03-08    ADMISSION DATE:  12/31/2013 CONSULTATION DATE:  01/01/2014  REFERRING MD :  Dyann Kief TRH  CHIEF COMPLAINT:  Shortness of breath, pain all over  INITIAL PRESENTATION: 43 y/o male with a history of polysubstance abuse admitted on 8/17 with increasing dyspnea and a large R pleural effusion after a recent episode of RLL pneumonia.  STUDIES:  8/18 CT chest > large R pleural effusion with compressive atelectasis of R lung, some left shift noted 8/21CT chest - rt effusions, loculations +, RLL consolidation  SIGNIFICANT EVENTS: 8/06  admitted for inability to walk chest pain, rhabdomyolysis 8/06  MRI Thoracic spine > no acute significant findings in thoracic spine, tiny t8-t9 disc protusion, RLL pneumonia 8/10  LHC > no CAD 8/11  discharged home on augmentin 8/18  Placement of R CT 8/19  50mg  TPA intrapleural 8/20  Improved CXR post placement of TPA  8/21  R thora > 750cc removed   SUBJECTIVE:  Afebrile Pt continues to report chest pain &  L knee pain (neg plain film).  No Drainage from pigtail  VITAL SIGNS: Temp:  [98.3 F (36.8 C)-99.2 F (37.3 C)] 98.4 F (36.9 C) (08/22 0800) Pulse Rate:  [28-122] 115 (08/22 0800) Resp:  [16-29] 19 (08/22 0800) BP: (153-167)/(85-99) 167/99 mmHg (08/21 2313) SpO2:  [87 %-100 %] 95 % (08/22 0800) Weight:  [102.1 kg (225 lb 1.4 oz)] 102.1 kg (225 lb 1.4 oz) (08/22 0400)  HEMODYNAMICS:    VENTILATOR SETTINGS:    INTAKE / OUTPUT:  Intake/Output Summary (Last 24 hours) at 01/05/14 0944 Last data filed at 01/05/14 0800  Gross per 24 hour  Intake   4160 ml  Output   5900 ml  Net  -1740 ml    PHYSICAL EXAMINATION: Gen: supine in bed, acutely ill HEENT: NCAT, EOMi PULM: diminished on R, CTA on left CV: Tachy, regular, no mgr AB: BS+, soft, nontender Ext: warm, no edema Neuro: Awake and alert today, maew   LABS:  CBC  Recent Labs Lab  01/02/14 0340 01/03/14 0345 01/04/14 1105  WBC 12.5* 15.4* 10.4  HGB 12.1* 13.9 12.0*  HCT 36.8* 43.3 35.6*  PLT 452* 422* 433*   BMET  Recent Labs Lab 01/03/14 0345 01/04/14 1105 01/05/14 0335  NA 135* 138 138  K 4.1 3.5* 3.7  CL 97 101 100  CO2 28 28 29   BUN 4* 6 4*  CREATININE 0.64 0.67 0.63  GLUCOSE 129* 127* 130*   Electrolytes  Recent Labs Lab 01/03/14 0345 01/04/14 1105 01/05/14 0335  CALCIUM 8.9 8.4 8.2*   Sepsis Markers  Recent Labs Lab 12/31/13 1840  LATICACIDVEN 2.9*    Imaging Ct Chest W Contrast  01/04/2014   CLINICAL DATA:  Pleural effusion  EXAM: CT CHEST WITH CONTRAST  TECHNIQUE: Multidetector CT imaging of the chest was performed during intravenous contrast administration.  CONTRAST:  65mL OMNIPAQUE IOHEXOL 300 MG/ML  SOLN  COMPARISON:  Chest CT December 31, 2013 and chest radiograph January 04, 2014  FINDINGS: Overall, the right-sided effusion is significantly smaller than on recent chest CT, status post chest tube placement. Note that the tube tip is in the right upper hemi thorax or fluid is drain. There is pleural effusion, moderate, in the right base which is not connected to the chest tube and remains. Two loculated fluid collections are seen adjacent to the right heart border. There is moderate consolidation in the right lower  lobe.  There is mild left base atelectatic change. Left lung is otherwise clear.  There is no appreciable thoracic adenopathy. Pericardium is not thickened.  In the visualized upper abdomen, there is fatty liver. Visualized upper abdominal structures otherwise appear normal. There are no blastic or lytic bone lesions. Visualized thyroid appears unremarkable.  IMPRESSION: There has been drainage of most of the loculated effusion from the upper right hemithorax due to the chest tube which is present in this area. Pleural fluid in the right base which appears at least partially free in partially loculated remains. There are two  loculated fluid collections which abuts the right heart border which also remain and are separate from the chest tube. There is patchy consolidation throughout much of the right lower lobe. Left lung is clear except for rather minimal left base atelectatic change.  There is fatty liver.   Electronically Signed   By: Lowella Grip M.D.   On: 01/04/2014 19:08   Dg Chest Port 1 View  01/04/2014   CLINICAL DATA:  Shortness of breath, cough, large pleural effusion  EXAM: PORTABLE CHEST - 1 VIEW  COMPARISON:  Portable exam 0948 hr compared to 0515 hr  FINDINGS: Pigtail RIGHT thoracostomy tube unchanged.  Enlargement of cardiac silhouette.  Mediastinal contours and pulmonary vascularity normal.  Persistent RIGHT pleural effusion and basilar atelectasis.  Lungs otherwise clear.  No pneumothorax or LEFT pleural effusion identified.  IMPRESSION: Persistent RIGHT pleural effusion post thoracostomy tube placement.  Persistent atelectasis RIGHT base, little changed from earlier study.   Electronically Signed   By: Lavonia Dana M.D.   On: 01/04/2014 09:55   Dg Chest Port 1 View  01/04/2014   CLINICAL DATA:  Followup pleural effusion. Status post pigtail drainage.  EXAM: PORTABLE CHEST - 1 VIEW  COMPARISON:  12/2013.  FINDINGS: Cardiac enlargement persists. Pigtail catheter missing good position. Considerable effusion remains inferior to the location of the drainage catheter. There is consolidation and atelectasis at the RIGHT mid and lower lung zones. The bones are unremarkable.  IMPRESSION: Stable chest. Considerable right-sided pleural effusion remains, unchanged from 01/03/2014.   Electronically Signed   By: Rolla Flatten M.D.   On: 01/04/2014 07:18     ASSESSMENT / PLAN:  PULMONARY OETT N/A A:  Acute R parapneumonic effusion > greatly improved with TPA (>3 liters), s/p removal of 700cc on thora 8/21, but loculations persist on CT HCAP Acute hypoxemic respiratory failure > nearly resolved P:   Flush  pigtail per protocol  CXR in AM Titrate supplemental O2 for O2 saturation > 92% Pulmonary hygiene: IS / Flutter valve  Consider removing pigtail chest tube soon TCTS input  CARDIOVASCULAR A:  Sinus tachycardia due to pain P:  Tele Pain control  RENAL A:   Hypokalemia P:   BMET today Monitor UOP Replace electrolytes as needed   INFECTIOUS A:   HCAP with parapneumonic effusion IV drug abuse BCx2 8/17 >ng Sputum 8/17 > Pleural Fluid Culture 8/18 >  NGTD Pleural Fluid Culture 8/21 >ng P:   Vanc 8/17 > 8/20 Cefepime 8/17 >  Note history of IV drug abuse  ENDOCRINE A:   No acute issues P:   Monitor glucose  NEUROLOGIC A:   Agitation, pain  EtOH abuse Knee Pain P:   Thiamine/folate daily Wean Librium PRN narcotics per primary service Negative plain films of L Knee, consider Ortho/MRI as outpatient  TODAY'S SUMMARY:  43 y/o male with polysubstance abuse, heroin use, and parapneumonic effusion with persistent loculations,  will likely need surgical drainage - spoke to dr bartle. Keep pigtail drainage for now   Kara Mead MD. FCCP. [facility] Pager [phone] If no response call [phone]

## 2014-01-05 NOTE — Consult Note (Signed)
[address]       [place] [address]             [phone]      Cardiothoracic Surgery Consultation   Reason for Consult: Right empyema Referring Physician: R. Elsworth Soho, MD  Ralph Chavez is an 43 y.o. male.  HPI:   The patient is a 43 year old gentleman with a history of anxiety and polysubstance abuse who was admitted earlier this month for rhabdomyolysis after heroin overdose and aspiration pneumonitis. He was treated with Unasyn and discharged on Augmentin. He was readmitted after multiple falls and right chest pain with worsening shortness of breath. Chest CT showed a large loculated right pleural effusion with near compressive atelectasis of the right lung. He had a 38 F right chest tube inserted by pulmonary medicine and was treated with tpa in the tube with eventual drainage of 3L of fluid. The cultures were negative. Yesterday he had a thoracentesis draining 750 cc of fluid that had a negative gram stain and negative culture so far. A follow up CT last night shows a fairly small loculated pleural fluid collection posteriorly in the inferior chest. The lateral and upper posterior pleural fluid has been drained and the right upper lung is up against the chest wall laterally. There is marked thickening of the parietal pleura. He says that he feels better since the thoracentesis yesterday and his chest pain is much better.  Past Medical History  Diagnosis Date  . Anxiety   . Ulcer     History reviewed. No pertinent past surgical history.  History reviewed. No pertinent family history.  Social History:  reports that he has been smoking Cigarettes.  He has been smoking about 0.00 packs per day. He has never used smokeless tobacco. He reports that he drinks alcohol. He reports that he uses illicit drugs (Cocaine and Heroin).  Allergies: No Known Allergies  Medications:  I have reviewed the patient's current medications. Prior to Admission:  Prescriptions  prior to admission  Medication Sig Dispense Refill  . amLODipine (NORVASC) 5 MG tablet Take 1 tablet (5 mg total) by mouth daily.  30 tablet  0  . amoxicillin-clavulanate (AUGMENTIN) 875-125 MG per tablet Take 1 tablet by mouth 2 (two) times daily.  10 tablet  0  . naproxen sodium (ANAPROX) 220 MG tablet Take 440 mg by mouth 2 (two) times daily as needed (pain).      Marland Kitchen omeprazole (PRILOSEC) 20 MG capsule Take 20 mg by mouth daily.      Marland Kitchen oxyCODONE-acetaminophen (ROXICET) 5-325 MG per tablet Take 1 tablet by mouth every 6 (six) hours as needed.  20 tablet  0   Scheduled: . antiseptic oral rinse  7 mL Mouth Rinse BID  . ceFEPime (MAXIPIME) IV  1 g Intravenous Q8H  . chlordiazePOXIDE  10 mg Oral Q12H  . folic acid  1 mg Intravenous Daily  . gabapentin  100 mg Oral TID  . guaiFENesin  1,200 mg Oral BID  . heparin subcutaneous  5,000 Units Subcutaneous 3 times per day  . lidocaine  2 patch Transdermal Q24H  . metoprolol tartrate  25 mg Oral BID  . naproxen  375 mg Oral TID WC  . nicotine  14 mg Transdermal Daily  . pantoprazole  40 mg Oral Daily  . pneumococcal 23 valent vaccine  0.5 mL Intramuscular Tomorrow-1000  . polyethylene glycol  17 g Oral Daily  . thiamine  100 mg Intravenous  Daily   Continuous: . sodium chloride 75 mL/hr at 01/05/14 2015   AGT:[id number]-[id number], morphine injection, ondansetron (ZOFRAN) IV, oxyCODONE-acetaminophen, senna-docusate  Results for orders placed during the hospital encounter of 12/31/13 (from the past 48 hour(s))  LACTATE DEHYDROGENASE, BODY FLUID     Status: Abnormal   Collection Time    01/04/14  9:52 AM      Result Value Ref Range   LD, Fluid 247 (*) 3 - 23 U/L   Comment: Performed at [facility]   Fluid Type-FLDH Pleural R    PROTEIN, BODY FLUID     Status: None   Collection Time    01/04/14  9:52 AM      Result Value Ref Range   Total protein, fluid 3.8     Comment: NO NORMAL RANGE ESTABLISHED FOR THIS TEST     Performed at  [facility]   Fluid Type-FTP Pleural R    BODY FLUID CELL COUNT WITH DIFFERENTIAL     Status: Abnormal   Collection Time    01/04/14  9:52 AM      Result Value Ref Range   Fluid Type-FCT Body Fluid     Comment: PLEURAL   Color, Fluid AMBER (*) YELLOW   Appearance, Fluid CLOUDY (*) CLEAR   WBC, Fluid 2405 (*) 0 - 1000 cu mm   Neutrophil Count, Fluid 66 (*) 0 - 25 %   Lymphs, Fluid 21     Monocyte-Macrophage-Serous Fluid 13 (*) 50 - 90 %   Eos, Fluid 0     Other Cells, Fluid CORRELATE WITH CYTOLOGY.    AFB CULTURE WITH SMEAR     Status: None   Collection Time    01/04/14  9:52 AM      Result Value Ref Range   Specimen Description PLEURAL     Special Requests Normal     Acid Fast Smear       Value: NO ACID FAST BACILLI SEEN     Performed at [facility]   Culture       Value: CULTURE WILL BE EXAMINED FOR 6 WEEKS BEFORE ISSUING A FINAL REPORT     Performed at [facility]   Report Status PENDING    BODY FLUID CULTURE     Status: None   Collection Time    01/04/14  9:52 AM      Result Value Ref Range   Specimen Description PLEURAL     Special Requests Normal     Gram Stain       Value: RARE WBC PRESENT, PREDOMINANTLY PMN     NO ORGANISMS SEEN     Performed at [facility]   Culture       Value: NO GROWTH 1 DAY     Performed at [facility]   Report Status PENDING    FUNGUS CULTURE W SMEAR     Status: None   Collection Time    01/04/14  9:52 AM      Result Value Ref Range   Specimen Description PLEURAL     Special Requests NONE     Fungal Smear       Value: NO YEAST OR FUNGAL ELEMENTS SEEN     Performed at [facility]   Culture       Value: CULTURE IN PROGRESS FOR FOUR WEEKS     Performed at [facility]   Report Status PENDING    LACTATE DEHYDROGENASE  Status: Abnormal   Collection Time    01/04/14 11:05 AM      Result Value Ref Range   LDH 78 (*) 94 - 250 U/L  PROTEIN, TOTAL     Status: Abnormal    Collection Time    01/04/14 11:05 AM      Result Value Ref Range   Total Protein 5.5 (*) 6.0 - 8.3 g/dL  BASIC METABOLIC PANEL     Status: Abnormal   Collection Time    01/04/14 11:05 AM      Result Value Ref Range   Sodium 138  137 - 147 mEq/L   Potassium 3.5 (*) 3.7 - 5.3 mEq/L   Chloride 101  96 - 112 mEq/L   CO2 28  19 - 32 mEq/L   Glucose, Bld 127 (*) 70 - 99 mg/dL   BUN 6  6 - 23 mg/dL   Creatinine, Ser 0.67  0.50 - 1.35 mg/dL   Calcium 8.4  8.4 - 10.5 mg/dL   GFR calc non Af Amer >90  >90 mL/min   GFR calc Af Amer >90  >90 mL/min   Comment: (NOTE)     The eGFR has been calculated using the CKD EPI equation.     This calculation has not been validated in all clinical situations.     eGFR's persistently <90 mL/min signify possible Chronic Kidney     Disease.   Anion gap 9  5 - 15  CBC WITH DIFFERENTIAL     Status: Abnormal   Collection Time    01/04/14 11:05 AM      Result Value Ref Range   WBC 10.4  4.0 - 10.5 Chavez/uL   RBC 3.89 (*) 4.22 - 5.81 MIL/uL   Hemoglobin 12.0 (*) 13.0 - 17.0 g/dL   HCT 35.6 (*) 39.0 - 52.0 %   MCV 91.5  78.0 - 100.0 fL   MCH 30.8  26.0 - 34.0 pg   MCHC 33.7  30.0 - 36.0 g/dL   RDW 15.0  11.5 - 15.5 %   Platelets 433 (*) 150 - 400 Chavez/uL   Neutrophils Relative % 82 (*) 43 - 77 %   Lymphocytes Relative 9 (*) 12 - 46 %   Monocytes Relative 7  3 - 12 %   Eosinophils Relative 2  0 - 5 %   Basophils Relative 0  0 - 1 %   Neutro Abs 8.6 (*) 1.7 - 7.7 Chavez/uL   Lymphs Abs 0.9  0.7 - 4.0 Chavez/uL   Monocytes Absolute 0.7  0.1 - 1.0 Chavez/uL   Eosinophils Absolute 0.2  0.0 - 0.7 Chavez/uL   Basophils Absolute 0.0  0.0 - 0.1 Chavez/uL   Smear Review MORPHOLOGY UNREMARKABLE    BASIC METABOLIC PANEL     Status: Abnormal   Collection Time    01/05/14  3:35 AM      Result Value Ref Range   Sodium 138  137 - 147 mEq/L   Potassium 3.7  3.7 - 5.3 mEq/L   Chloride 100  96 - 112 mEq/L   CO2 29  19 - 32 mEq/L   Glucose, Bld 130 (*) 70 - 99 mg/dL   BUN 4 (*) 6 - 23 mg/dL    Creatinine, Ser 0.63  0.50 - 1.35 mg/dL   Calcium 8.2 (*) 8.4 - 10.5 mg/dL   GFR calc non Af Amer >90  >90 mL/min   GFR calc Af Amer >90  >90 mL/min   Comment: (NOTE)  The eGFR has been calculated using the CKD EPI equation.     This calculation has not been validated in all clinical situations.     eGFR's persistently <90 mL/min signify possible Chronic Kidney     Disease.   Anion gap 9  5 - 15    Ct Chest W Contrast  01/04/2014   CLINICAL DATA:  Pleural effusion  EXAM: CT CHEST WITH CONTRAST  TECHNIQUE: Multidetector CT imaging of the chest was performed during intravenous contrast administration.  CONTRAST:  79m OMNIPAQUE IOHEXOL 300 MG/ML  SOLN  COMPARISON:  Chest CT December 31, 2013 and chest radiograph January 04, 2014  FINDINGS: Overall, the right-sided effusion is significantly smaller than on recent chest CT, status post chest tube placement. Note that the tube tip is in the right upper hemi thorax or fluid is drain. There is pleural effusion, moderate, in the right base which is not connected to the chest tube and remains. Two loculated fluid collections are seen adjacent to the right heart border. There is moderate consolidation in the right lower lobe.  There is mild left base atelectatic change. Left lung is otherwise clear.  There is no appreciable thoracic adenopathy. Pericardium is not thickened.  In the visualized upper abdomen, there is fatty liver. Visualized upper abdominal structures otherwise appear normal. There are no blastic or lytic bone lesions. Visualized thyroid appears unremarkable.  IMPRESSION: There has been drainage of most of the loculated effusion from the upper right hemithorax due to the chest tube which is present in this area. Pleural fluid in the right base which appears at least partially free in partially loculated remains. There are two loculated fluid collections which abuts the right heart border which also remain and are separate from the chest tube. There  is patchy consolidation throughout much of the right lower lobe. Left lung is clear except for rather minimal left base atelectatic change.  There is fatty liver.   Electronically Signed   By: WLowella GripM.D.   On: 01/04/2014 19:08   Dg Chest Port 1 View  01/04/2014   CLINICAL DATA:  Shortness of breath, cough, large pleural effusion  EXAM: PORTABLE CHEST - 1 VIEW  COMPARISON:  Portable exam 0948 hr compared to 0515 hr  FINDINGS: Pigtail RIGHT thoracostomy tube unchanged.  Enlargement of cardiac silhouette.  Mediastinal contours and pulmonary vascularity normal.  Persistent RIGHT pleural effusion and basilar atelectasis.  Lungs otherwise clear.  No pneumothorax or LEFT pleural effusion identified.  IMPRESSION: Persistent RIGHT pleural effusion post thoracostomy tube placement.  Persistent atelectasis RIGHT base, little changed from earlier study.   Electronically Signed   By: MLavonia DanaM.D.   On: 01/04/2014 09:55   Dg Chest Port 1 View  01/04/2014   CLINICAL DATA:  Followup pleural effusion. Status post pigtail drainage.  EXAM: PORTABLE CHEST - 1 VIEW  COMPARISON:  12/2013.  FINDINGS: Cardiac enlargement persists. Pigtail catheter missing good position. Considerable effusion remains inferior to the location of the drainage catheter. There is consolidation and atelectasis at the RIGHT mid and lower lung zones. The bones are unremarkable.  IMPRESSION: Stable chest. Considerable right-sided pleural effusion remains, unchanged from 01/03/2014.   Electronically Signed   By: JRolla FlattenM.D.   On: 01/04/2014 07:18    Review of Systems  Constitutional: Negative for fever, chills and weight loss.  HENT: Negative.   Eyes: Negative.   Respiratory: Positive for cough, sputum production and shortness of breath.   Cardiovascular: Negative for  chest pain and orthopnea.  Gastrointestinal: Negative.   Genitourinary: Negative.   Musculoskeletal: Positive for myalgias.       Left leg  Neurological:        Numbness and pain in left leg.  Endo/Heme/Allergies: Negative.   Psychiatric/Behavioral: Positive for substance abuse. The patient is nervous/anxious.    Blood pressure 162/98, pulse 118, temperature 98.6 F (37 C), temperature source Oral, resp. rate 24, height 5' 10"  (1.778 m), weight 102.1 kg (225 lb 1.4 oz), SpO2 97.00%. Physical Exam  Constitutional: He is oriented to person, place, and time. He appears well-developed and well-nourished. No distress.  HENT:  Head: Normocephalic and atraumatic.  Mouth/Throat: Oropharynx is clear and moist.  Eyes: EOM are normal. Pupils are equal, round, and reactive to light.  Neck: Normal range of motion. Neck supple. No thyromegaly present.  Cardiovascular: Normal rate, regular rhythm, normal heart sounds and intact distal pulses.   No murmur heard. Respiratory: Effort normal. No respiratory distress. He has no rales. He exhibits no tenderness.  Slight decrease in breath sounds over the right chest.  GI: Soft. Bowel sounds are normal. He exhibits no distension and no mass. There is no tenderness.  Musculoskeletal: Normal range of motion. He exhibits no edema.  Lymphadenopathy:    He has no cervical adenopathy.  Neurological: He is alert and oriented to person, place, and time. He has normal strength. No cranial nerve deficit or sensory deficit.  Skin: Skin is warm and dry.  Psychiatric: He has a normal mood and affect.   CLINICAL DATA: Pleural effusion  EXAM:  CT CHEST WITH CONTRAST  TECHNIQUE:  Multidetector CT imaging of the chest was performed during  intravenous contrast administration.  CONTRAST: 66m OMNIPAQUE IOHEXOL 300 MG/ML SOLN  COMPARISON: Chest CT December 31, 2013 and chest radiograph January 04, 2014  FINDINGS:  Overall, the right-sided effusion is significantly smaller than on  recent chest CT, status post chest tube placement. Note that the  tube tip is in the right upper hemi thorax or fluid is drain. There  is pleural  effusion, moderate, in the right base which is not  connected to the chest tube and remains. Two loculated fluid  collections are seen adjacent to the right heart border. There is  moderate consolidation in the right lower lobe.  There is mild left base atelectatic change. Left lung is otherwise  clear.  There is no appreciable thoracic adenopathy. Pericardium is not  thickened.  In the visualized upper abdomen, there is fatty liver. Visualized  upper abdominal structures otherwise appear normal. There are no  blastic or lytic bone lesions. Visualized thyroid appears  unremarkable.  IMPRESSION:  There has been drainage of most of the loculated effusion from the  upper right hemithorax due to the chest tube which is present in  this area. Pleural fluid in the right base which appears at least  partially free in partially loculated remains. There are two  loculated fluid collections which abuts the right heart border which  also remain and are separate from the chest tube. There is patchy  consolidation throughout much of the right lower lobe. Left lung is  clear except for rather minimal left base atelectatic change.  There is fatty liver.  Electronically Signed  By: WLowella GripM.D.  On: 01/04/2014 19:08   Assessment/Plan:  He has a loculated right pleural effusion/empyema that has been present for at least several weeks and treated with chest tube drainage and tpa followed by thoracentesis  of a residual collection yesterday. The fluid gram stains and cultures have been unremarkable suggesting this is all inflammatory and not infected. His CT last night shows a fairly small residual collection inferior-posteriorly and the remainder of the lung appears adjacent to the chest wall with parietal pleural thickening. There is still residual consolidation of the right lower lobe. He is feeling better and is afebrile with a normal WBC ct. I don't think there is a benefit to thoracotomy for  drainage of the residual effusion and it may cause more problems since the lung may be mostly adherent to the chest wall. I would try ultrasound guided drainage of the residual posterior fluid collection. He needs to continue IS, ambulation, coughing and deep breathing to resolve the residual consolidation in the right lung.  Ralph Chavez,Ralph Chavez 01/05/2014, 9:30 PM

## 2014-01-05 NOTE — Progress Notes (Signed)
PT Cancellation Note  Patient Details Name: Ralph Chavez MRN: [id number] DOB: December 21, 1970   Cancelled Treatment:    Reason Eval/Treat Not Completed: Other (comment) (RN reports patient in recliner and is comfortable and requests PT come back tomorrow to ambulate. )   Claretha Cooper 01/05/2014, 11:08 AM Tresa Endo PT [phone]

## 2014-01-06 ENCOUNTER — Inpatient Hospital Stay (HOSPITAL_COMMUNITY): Payer: Self-pay

## 2014-01-06 LAB — BASIC METABOLIC PANEL
ANION GAP: 8 (ref 5–15)
BUN: 5 mg/dL — AB (ref 6–23)
CALCIUM: 8.6 mg/dL (ref 8.4–10.5)
CHLORIDE: 102 meq/L (ref 96–112)
CO2: 30 meq/L (ref 19–32)
CREATININE: 0.65 mg/dL (ref 0.50–1.35)
GFR calc non Af Amer: >90 mL/min (ref >90)
Glucose, Bld: 115 mg/dL — ABNORMAL HIGH (ref 70–99)
Potassium: 3.8 mEq/L (ref 3.7–5.3)
Sodium: 140 mEq/L (ref 137–147)

## 2014-01-06 LAB — CULTURE, BLOOD (ROUTINE X 2)
Culture: NO GROWTH
Culture: NO GROWTH

## 2014-01-06 LAB — CBC
HEMATOCRIT: 34.2 % — AB (ref 39.0–52.0)
HEMOGLOBIN: 11.3 g/dL — AB (ref 13.0–17.0)
MCH: 30 pg (ref 26.0–34.0)
MCHC: 33 g/dL (ref 30.0–36.0)
MCV: 90.7 fL (ref 78.0–100.0)
Platelets: 450 10*3/uL — ABNORMAL HIGH (ref 150–400)
RBC: 3.77 MIL/uL — ABNORMAL LOW (ref 4.22–5.81)
RDW: 14.9 % (ref 11.5–15.5)
WBC: 12.2 10*3/uL — ABNORMAL HIGH (ref 4.0–10.5)

## 2014-01-06 MED ORDER — GABAPENTIN 100 MG PO CAPS
200.0000 mg | ORAL_CAPSULE | Freq: Three times a day (TID) | ORAL | Status: DC
  Administered 2014-01-06: 200 mg via ORAL
  Administered 2014-01-06: 100 mg via ORAL
  Administered 2014-01-06 – 2014-01-07 (×2): 200 mg via ORAL
  Filled 2014-01-06 (×3): qty 2

## 2014-01-06 NOTE — Evaluation (Addendum)
Physical Therapy Evaluation Patient Details Name: Ralph Chavez MRN: [id number] DOB: 1971-04-08 Today's Date: 01/06/2014   History of Present Illness  43 yo male admitted 12/31/13 with SOB, found to have  Pleural effusion, Chest tube removed 01/06/14. Recently in hospital for fall, rhabdo. Hx substance abuse.  Clinical Impression  Pt continues with dysfunction of L quads, L hip flexor weakness, impaired sensation and risk for L knee buckling if  Knee is not "locked" during stance. Pt may benefit from   A simple Knee immobilizer may provide substantial support to knee for safer ambulation,especially on unlevel surfaces. PT reviewed notes from previous admission. Pt may benefit from a neurology consult  For L  LE. Pt will benefit from PT to address problems listed in notes. PT requests an order for Knee immobilizer to try  At next visit. Thank You.    Follow Up Recommendations  (follow up  For  L quad dysfunction)    Equipment Recommendations  Rolling walker with 5" wheels (may need a Knee immobilizer or locking knee brace for L  due to dysfunction of quads)    Recommendations for Other Services       Precautions / Restrictions Precautions Precautions: Fall Precaution Comments: appears L quads are niot firing of ? etiology, Knee will buckle if knee not locked in during stance      Mobility  Bed Mobility               General bed mobility comments: pt sitting in recliner  Transfers Overall transfer level: Needs assistance Equipment used: Rolling walker (2 wheeled) Transfers: Sit to/from Stand Sit to Stand: Min guard         General transfer comment: close guard for safety.   Ambulation/Gait Ambulation/Gait assistance: Min guard Ambulation Distance (Feet): 400 Feet Assistive device: Rolling walker (2 wheeled) Gait Pattern/deviations: Step-through pattern     General Gait Details: pt ambulated with RW until returned to room where he   ceased its use and walked holding  onto IV pole, wall, bed for suopport  Stairs            Wheelchair Mobility    Modified Rankin (Stroke Patients Only)       Balance Overall balance assessment: Needs assistance         Standing balance support: No upper extremity supported;During functional activity Standing balance-Leahy Scale: Poor Standing balance comment: if L knee starts to flex/buckle                             Pertinent Vitals/Pain Pain Assessment: 0-10 Pain Score: 8  Pain Location: L  Thigh to medial lower leg Pain Descriptors / Indicators: Burning;Discomfort;Radiating;Pins and needles;Tingling Pain Intervention(s): Monitored during session;Repositioned;RN gave pain meds during session VS- pt ambulated on RA, sats lowest 85%, HR 115    Home Living Family/patient expects to be discharged to:: Unsure Living Arrangements:  (has been homeless)                    Prior Function           Comments: reports frequent falls     Hand Dominance        Extremity/Trunk Assessment               Lower Extremity Assessment: LLE deficits/detail   LLE Deficits / Details: 0/5  quad, hip flexion 3/5, dorsiflexion and plantar flexion are normal, reports anterior thigh is tingling, pins  and needles to the touch, also reports medial lower leg sensation is abnormal  Cervical / Trunk Assessment: Normal  Communication      Cognition Arousal/Alertness: Awake/alert Behavior During Therapy: WFL for tasks assessed/performed;Agitated (at times related to questioning) Overall Cognitive Status: Within Functional Limits for tasks assessed                      General Comments      Exercises        Assessment/Plan    PT Assessment Patient needs continued PT services  PT Diagnosis Difficulty walking;Abnormality of gait;Generalized weakness;Acute pain   PT Problem List Decreased strength;Decreased activity tolerance;Decreased balance;Decreased  mobility;Pain;Decreased knowledge of use of DME  PT Treatment Interventions DME instruction;Gait training;Functional mobility training;Therapeutic activities;Patient/family education;Balance training;Therapeutic exercise   PT Goals (Current goals can be found in the Care Plan section) Acute Rehab PT Goals Patient Stated Goal: walk and not fall PT Goal Formulation: With patient Time For Goal Achievement: 01/20/14 Potential to Achieve Goals: Good    Frequency Min 3X/week   Barriers to discharge        Co-evaluation               End of Session   Activity Tolerance: Patient tolerated treatment well Patient left: in chair;with call bell/phone within reach Nurse Communication: Mobility status         Time: 1225-1300 PT Time Calculation (min): 35 min   Charges:   PT Evaluation $Initial PT Evaluation Tier I: 1 Procedure PT Treatments $Gait Training: 23-37 mins   PT G CodesClaretha Cooper 01/06/2014, 1:41 PM Tresa Endo PT [phone]

## 2014-01-06 NOTE — Progress Notes (Signed)
PULMONARY / CRITICAL CARE MEDICINE   Name: Ralph Chavez MRN: [id number] DOB: 10/08/1970    ADMISSION DATE:  12/31/2013 CONSULTATION DATE:  01/01/2014  REFERRING MD :  Dyann Kief TRH  CHIEF COMPLAINT:  Shortness of breath, pain all over  INITIAL PRESENTATION: 43 y/o male with a history of polysubstance abuse admitted on 8/17 with increasing dyspnea and a large R pleural effusion after a recent episode of RLL pneumonia.  STUDIES:  8/18 CT chest > large R pleural effusion with compressive atelectasis of R lung, some left shift noted 8/21CT chest - rt effusions, loculations +, RLL consolidation  SIGNIFICANT EVENTS: 8/06  admitted for inability to walk chest pain, rhabdomyolysis 8/06  MRI Thoracic spine > no acute significant findings in thoracic spine, tiny t8-t9 disc protusion, RLL pneumonia 8/10  LHC > no CAD 8/11  discharged home on augmentin 8/18  Placement of R CT 8/19  50mg  TPA intrapleural 8/20  Improved CXR post placement of TPA  8/21  R thora > 750cc removed   SUBJECTIVE:  Afebrile Chest pain improved Pt continues to report L knee pain (neg plain film).  No Drainage from pigtail  VITAL SIGNS: Temp:  [98 F (36.7 C)-99.6 F (37.6 C)] 99.6 F (37.6 C) (08/23 0815) Pulse Rate:  [97-123] 119 (08/23 1059) Resp:  [17-24] 23 (08/23 0900) BP: (153-175)/(93-101) 153/101 mmHg (08/23 1059) SpO2:  [91 %-98 %] 93 % (08/23 0900)  HEMODYNAMICS:    VENTILATOR SETTINGS:    INTAKE / OUTPUT:  Intake/Output Summary (Last 24 hours) at 01/06/14 1118 Last data filed at 01/06/14 0900  Gross per 24 hour  Intake 2362.5 ml  Output   4875 ml  Net -2512.5 ml    PHYSICAL EXAMINATION: Gen: supine in bed, acutely ill HEENT: NCAT, EOMi PULM: diminished on R, CTA on left CV: Tachy, regular, no mgr AB: BS+, soft, nontender Ext: warm, no edema Neuro: Awake and alert today, maew   LABS:  CBC  Recent Labs Lab 01/03/14 0345 01/04/14 1105 01/06/14 0400  WBC 15.4* 10.4 12.2*   HGB 13.9 12.0* 11.3*  HCT 43.3 35.6* 34.2*  PLT 422* 433* 450*   BMET  Recent Labs Lab 01/04/14 1105 01/05/14 0335 01/06/14 0400  NA 138 138 140  K 3.5* 3.7 3.8  CL 101 100 102  CO2 28 29 30   BUN 6 4* 5*  CREATININE 0.67 0.63 0.65  GLUCOSE 127* 130* 115*   Electrolytes  Recent Labs Lab 01/04/14 1105 01/05/14 0335 01/06/14 0400  CALCIUM 8.4 8.2* 8.6   Sepsis Markers  Recent Labs Lab 12/31/13 1840  LATICACIDVEN 2.9*    Imaging No results found.   ASSESSMENT / PLAN:  PULMONARY OETT N/A A:  Acute R parapneumonic effusion > greatly improved with TPA (>3 liters), s/p removal of 700cc on thora 8/21, but loculations persist on CT HCAP Acute hypoxemic respiratory failure > nearly resolved P:   dc pigtail  CXR in AM Titrate supplemental O2 for O2 saturation > 92% Pulmonary hygiene: IS / Flutter valve  TCTS input appreciated, will plan for iR drainage  CARDIOVASCULAR A:  Sinus tachycardia due to pain P:  Tele Pain control  RENAL A:   Hypokalemia P:   BMET today Monitor UOP Replace electrolytes as needed   INFECTIOUS A:   HCAP with parapneumonic effusion IV drug abuse BCx2 8/17 >ng Sputum 8/17 > Pleural Fluid Culture 8/18 >  NGTD Pleural Fluid Culture 8/21 >ng P:   Vanc 8/17 > 8/20 Cefepime 8/17 >  Note  history of IV drug abuse   NEUROLOGIC A:   Agitation, pain  EtOH abuse Knee Pain P:   Thiamine/folate daily Wean Librium PRN narcotics per primary service Negative plain films of L Knee, consider Ortho/MRI as outpatient  TODAY'S SUMMARY:  43 y/o male with polysubstance abuse, heroin use, and parapneumonic effusion with persistent loculations, discussed surgical drainage with dr Cyndia Bent. Plan for IR guided drain for remainder of loculation, may expect some degree of trapped lung in the future   Kara Mead MD. FCCP. [facility] Pager [phone] If no response call [phone]

## 2014-01-07 ENCOUNTER — Inpatient Hospital Stay (HOSPITAL_COMMUNITY): Payer: Self-pay

## 2014-01-07 DIAGNOSIS — A419 Sepsis, unspecified organism: Secondary | ICD-10-CM

## 2014-01-07 LAB — BODY FLUID CULTURE
Culture: NO GROWTH
Special Requests: NORMAL

## 2014-01-07 LAB — BASIC METABOLIC PANEL
Anion gap: 10 (ref 5–15)
BUN: 5 mg/dL — ABNORMAL LOW (ref 6–23)
CO2: 29 mEq/L (ref 19–32)
Calcium: 8.6 mg/dL (ref 8.4–10.5)
Chloride: 100 mEq/L (ref 96–112)
Creatinine, Ser: 0.75 mg/dL (ref 0.50–1.35)
Glucose, Bld: 154 mg/dL — ABNORMAL HIGH (ref 70–99)
POTASSIUM: 3.6 meq/L — AB (ref 3.7–5.3)
Sodium: 139 mEq/L (ref 137–147)

## 2014-01-07 LAB — CBC
HCT: 34.6 % — ABNORMAL LOW (ref 39.0–52.0)
Hemoglobin: 11.3 g/dL — ABNORMAL LOW (ref 13.0–17.0)
MCH: 29.8 pg (ref 26.0–34.0)
MCHC: 32.7 g/dL (ref 30.0–36.0)
MCV: 91.3 fL (ref 78.0–100.0)
PLATELETS: 411 10*3/uL — AB (ref 150–400)
RBC: 3.79 MIL/uL — ABNORMAL LOW (ref 4.22–5.81)
RDW: 14.9 % (ref 11.5–15.5)
WBC: 13.7 10*3/uL — ABNORMAL HIGH (ref 4.0–10.5)

## 2014-01-07 MED ORDER — SACCHAROMYCES BOULARDII 250 MG PO CAPS
250.0000 mg | ORAL_CAPSULE | Freq: Two times a day (BID) | ORAL | Status: DC
  Administered 2014-01-07 – 2014-01-11 (×9): 250 mg via ORAL
  Filled 2014-01-07 (×13): qty 1

## 2014-01-07 MED ORDER — GABAPENTIN 300 MG PO CAPS
300.0000 mg | ORAL_CAPSULE | Freq: Three times a day (TID) | ORAL | Status: DC
  Administered 2014-01-07 – 2014-01-11 (×13): 300 mg via ORAL
  Filled 2014-01-07 (×18): qty 1

## 2014-01-07 NOTE — ED Provider Notes (Signed)
CSN: [id number]     Arrival date & time 12/31/13  3 History   First MD Initiated Contact with Patient 12/31/13 1803     Chief Complaint  Patient presents with  . Shortness of Breath     (Consider location/radiation/quality/duration/timing/severity/associated sxs/prior Treatment) HPI   43 y.o. male with Past medical history of anxiety, substance abuse. He was recently admitted for rhabdomyolysis after heroin overdose and developed aspiration pneumonitis.  He also had non-STEMI with normal coronaries on subsequent cath. He was discharged on Augmentin and was recommended to followup as an outpatient. He reports compliance with meds. Presenting with R sided CP and worsening SOB. Reports progressively worsening since DC. Denies further drug use. Subjective fever. Nausea. No vomiting.   Past Medical History  Diagnosis Date  . Anxiety   . Ulcer    History reviewed. No pertinent past surgical history. History reviewed. No pertinent family history. History  Substance Use Topics  . Smoking status: Current Every Day Smoker    Types: Cigarettes  . Smokeless tobacco: Never Used  . Alcohol Use: Yes    Review of Systems  All systems reviewed and negative, other than as noted in HPI.   Allergies  Review of patient's allergies indicates no known allergies.  Home Medications   Prior to Admission medications   Medication Sig Start Date End Date Taking? Authorizing Provider  amLODipine (NORVASC) 5 MG tablet Take 1 tablet (5 mg total) by mouth daily. 12/25/13  Yes Verlee Monte, MD  amoxicillin-clavulanate (AUGMENTIN) 875-125 MG per tablet Take 1 tablet by mouth 2 (two) times daily. 12/25/13  Yes Verlee Monte, MD  naproxen sodium (ANAPROX) 220 MG tablet Take 440 mg by mouth 2 (two) times daily as needed (pain).   Yes Historical Provider, MD  omeprazole (PRILOSEC) 20 MG capsule Take 20 mg by mouth daily.   Yes Historical Provider, MD  oxyCODONE-acetaminophen (ROXICET) 5-325 MG per tablet Take  1 tablet by mouth every 6 (six) hours as needed. 12/25/13  Yes Mutaz Elmahi, MD   BP 169/105  Pulse 110  Temp(Src) 98.6 F (37 C) (Oral)  Resp 16  Ht 5\' 10"  (1.778 m)  Wt 221 lb 9 oz (100.5 kg)  BMI 31.79 kg/m2  SpO2 95% Physical Exam  Nursing note and vitals reviewed. Constitutional: He appears well-developed and well-nourished. No distress.  HENT:  Head: Normocephalic and atraumatic.  Eyes: Conjunctivae are normal. Right eye exhibits no discharge. Left eye exhibits no discharge.  Neck: Neck supple.  Cardiovascular: Regular rhythm and normal heart sounds.  Exam reveals no gallop and no friction rub.   No murmur heard. tachycardic  Pulmonary/Chest:  Tenderness R chest wall. No concerning external lesions. Mild tachypnea. Speaking in complete sentences though. Decreased breath sounds R  Abdominal: Soft. He exhibits no distension. There is no tenderness.  Musculoskeletal: He exhibits no edema and no tenderness.  Lower extremities symmetric as compared to each other. No calf tenderness. Negative Homan's. No palpable cords.   Neurological: He is alert.  Skin: Skin is warm and dry.  Psychiatric: He has a normal mood and affect. His behavior is normal. Thought content normal.    ED Course  Procedures (including critical care time) Labs Review Labs Reviewed  CBC WITH DIFFERENTIAL - Abnormal; Notable for the following:    WBC 20.3 (*)    RBC 4.13 (*)    Hemoglobin 12.8 (*)    HCT 38.1 (*)    Platelets 457 (*)    Neutrophils Relative % 85 (*)  Neutro Abs 17.2 (*)    Lymphocytes Relative 6 (*)    Monocytes Absolute 1.6 (*)    All other components within normal limits  BASIC METABOLIC PANEL - Abnormal; Notable for the following:    Potassium 3.4 (*)    Glucose, Bld 167 (*)    BUN 3 (*)    All other components within normal limits  URINALYSIS, ROUTINE W REFLEX MICROSCOPIC - Abnormal; Notable for the following:    Specific Gravity, Urine 1.004 (*)    Hgb urine dipstick SMALL  (*)    All other components within normal limits  LACTIC ACID, PLASMA - Abnormal; Notable for the following:    Lactic Acid, Venous 2.9 (*)    All other components within normal limits  CBC - Abnormal; Notable for the following:    WBC 12.5 (*)    RBC 3.95 (*)    Hemoglobin 12.1 (*)    HCT 36.8 (*)    Platelets 452 (*)    All other components within normal limits  BASIC METABOLIC PANEL - Abnormal; Notable for the following:    Glucose, Bld 146 (*)    BUN 3 (*)    All other components within normal limits  BASIC METABOLIC PANEL - Abnormal; Notable for the following:    Sodium 135 (*)    Glucose, Bld 129 (*)    BUN 4 (*)    All other components within normal limits  CBC - Abnormal; Notable for the following:    WBC 15.4 (*)    Platelets 422 (*)    All other components within normal limits  VANCOMYCIN, TROUGH - Abnormal; Notable for the following:    Vancomycin Tr <5.0 (*)    All other components within normal limits  LACTATE DEHYDROGENASE, BODY FLUID - Abnormal; Notable for the following:    LD, Fluid 247 (*)    All other components within normal limits  BODY FLUID CELL COUNT WITH DIFFERENTIAL - Abnormal; Notable for the following:    Color, Fluid AMBER (*)    Appearance, Fluid CLOUDY (*)    WBC, Fluid 2405 (*)    Neutrophil Count, Fluid 66 (*)    Monocyte-Macrophage-Serous Fluid 13 (*)    All other components within normal limits  LACTATE DEHYDROGENASE - Abnormal; Notable for the following:    LDH 78 (*)    All other components within normal limits  PROTEIN, TOTAL - Abnormal; Notable for the following:    Total Protein 5.5 (*)    All other components within normal limits  BASIC METABOLIC PANEL - Abnormal; Notable for the following:    Potassium 3.5 (*)    Glucose, Bld 127 (*)    All other components within normal limits  CBC WITH DIFFERENTIAL - Abnormal; Notable for the following:    RBC 3.89 (*)    Hemoglobin 12.0 (*)    HCT 35.6 (*)    Platelets 433 (*)     Neutrophils Relative % 82 (*)    Lymphocytes Relative 9 (*)    Neutro Abs 8.6 (*)    All other components within normal limits  BASIC METABOLIC PANEL - Abnormal; Notable for the following:    Glucose, Bld 130 (*)    BUN 4 (*)    Calcium 8.2 (*)    All other components within normal limits  BASIC METABOLIC PANEL - Abnormal; Notable for the following:    Glucose, Bld 115 (*)    BUN 5 (*)    All other components  within normal limits  CBC - Abnormal; Notable for the following:    WBC 12.2 (*)    RBC 3.77 (*)    Hemoglobin 11.3 (*)    HCT 34.2 (*)    Platelets 450 (*)    All other components within normal limits  BASIC METABOLIC PANEL - Abnormal; Notable for the following:    Potassium 3.6 (*)    Glucose, Bld 154 (*)    BUN 5 (*)    All other components within normal limits  CBC - Abnormal; Notable for the following:    WBC 13.7 (*)    RBC 3.79 (*)    Hemoglobin 11.3 (*)    HCT 34.6 (*)    Platelets 411 (*)    All other components within normal limits  CULTURE, BLOOD (ROUTINE X 2)  CULTURE, BLOOD (ROUTINE X 2)  GRAM STAIN  MRSA PCR SCREENING  BODY FLUID CULTURE  AFB CULTURE WITH SMEAR  BODY FLUID CULTURE  FUNGUS CULTURE W SMEAR  CULTURE, EXPECTORATED SPUTUM-ASSESSMENT  URINE RAPID DRUG SCREEN (HOSP PERFORMED)  CK  URINE MICROSCOPIC-ADD ON  LEGIONELLA ANTIGEN, URINE  STREP PNEUMONIAE URINARY ANTIGEN  PROTEIN, BODY FLUID  CBC  CYTOLOGY - NON PAP    Imaging Review Dg Chest Port 1 View  01/06/2014   CLINICAL DATA:  Status post right chest tube removal  EXAM: PORTABLE CHEST - 1 VIEW  COMPARISON:  Portable chest x-ray of January 04, 2014  FINDINGS: There has been interval removal of the small caliber chest tube on the right. There remains pleural thickening or fluid along the right lateral thoracic wall and at the right lung base. There is no pneumothorax. The left lung is adequately inflated and grossly clear. The cardiac silhouette is top-normal in size but stable. The  central pulmonary vascularity is prominent but also stable.  IMPRESSION: There is no evidence of significant additional pleural fluid reaccumulation nor development of a pneumothorax since removal of the right-sided chest tube.   Electronically Signed   By: David  Martinique   On: 01/06/2014 12:22     EKG Interpretation   Date/Time:  Monday December 31 2013 18:14:57 EDT Ventricular Rate:  134 PR Interval:  134 QRS Duration: 98 QT Interval:  320 QTC Calculation: 478 R Axis:   139 Text Interpretation:  Right and left arm electrode reversal,  interpretation assumes no reversal Sinus or ectopic atrial tachycardia  Right axis deviation Borderline T abnormalities, inferior leads Borderline  prolonged QT interval ED PHYSICIAN INTERPRETATION AVAILABLE IN [facility] Confirmed by TEST, Record ([id number]) on 01/02/2014 7:18:59 AM      MDM   Final diagnoses:  Pleural effusion  Paresthesia of both lower extremities  Adjustment disorder with anxious mood  Tobacco abuse  Leukocytosis, unspecified  Gastroesophageal reflux disease, esophagitis presence not specified  Polysubstance abuse  Acute respiratory failure with hypoxia  HCAP (healthcare-associated pneumonia)  Body aches  Sepsis, due to unspecified organism  Loculated pleural effusion    43yM with sepsis. Large R pleural effusion. Likely para pneumonic. Will likely need thoracentesis, but with mild increased WOBI feel this can be deferred to IR or pulmonology. Abx to cover potential HCAP. Admit.     Virgel Manifold, MD 01/08/14 [phone]

## 2014-01-07 NOTE — Progress Notes (Signed)
Patient ID: Ralph Chavez, male   DOB: 12-04-1970, 43 y.o.   MRN: [id number]  Request made for R pleural effusion additional drain placement  Dr Laurence Ferrari has reviewed imaging Rec: laying pt flat in bed on Rt side Feels existing tube may very well be able to drain collection remaining  If this has been attempted and still without success We would be able to place new drain  I have called and discussed case with Joellen Jersey PA CCM She understands recommendation Will return call to me if needed.

## 2014-01-07 NOTE — Progress Notes (Addendum)
CARE MANAGEMENT NOTE 01/07/2014  Patient:  Ralph Chavez, Ralph Chavez   Account Number:  [id number]  Date Initiated:  01/01/2014  Documentation initiated by:  DAVIS,RHONDA  Subjective/Objective Assessment:   pt with hx of polysubstance abuse/presented with increased wob films reveals a large pl.effusion with compression of the rt lung and shunting of the cardiac structures to the left     Action/Plan:   home when stable/pt did not follow up with plans after last and most recent admission/from previous [facility] F/U,PROVIDED [place]   Anticipated DC Date:  01/10/2014   Anticipated DC Plan:  HOME/SELF CARE  In-house referral  Clinical Social Worker  Development worker, community      DC Planning Services  CM consult      [facility] Choice  NA   Choice offered to / List presented to:  NA      DME agency  NA     [place] arranged  NA      [place] agency  NA   Status of service:  In process, will continue to follow Medicare Important Message given?  NA - LOS <3 / Initial given by admissions (If response is "NO", the following Medicare IM given date fields will be blank) Date Medicare IM given:   Medicare IM given by:   Date Additional Medicare IM given:   Additional Medicare IM given by:    Discharge Disposition:    Per UR Regulation:  Reviewed for med. necessity/level of care/duration of stay  If discussed at [place] of Stay Meetings, dates discussed:   01/08/2014    Comments:  [id number]/Rhonda DAvis,RN,BSN,CCM: Patient with hx of polysubstance abuse /abnormal ecg/ rt to left cardiac shift on admission/ chest tube removed on [id number]/IR guided thoracentesis to remove residual loculations/  01/04/14 KATHY MAHABIR RN,BSN NCM [phone] THORACENTESIS.  [id number] to have chest tube insertion due to extremely large plueral effusion and compression of the rt lung and left shifting of  the cardiac structures. Rhonda Davis,RN,BSN,CCM

## 2014-01-07 NOTE — Progress Notes (Signed)
PULMONARY / CRITICAL CARE MEDICINE   Name: Ralph Chavez MRN: [id number] DOB: 08/30/1970    ADMISSION DATE:  12/31/2013 CONSULTATION DATE:  01/01/2014  REFERRING MD :  Dyann Kief TRH  CHIEF COMPLAINT:  Shortness of breath, pain all over  INITIAL PRESENTATION: 43 y/o male with a history of polysubstance abuse admitted on 8/17 with increasing dyspnea and a large R pleural effusion after a recent episode of RLL pneumonia.  STUDIES:  8/18 CT chest > large R pleural effusion with compressive atelectasis of R lung, some left shift noted 8/21 CT chest > rt effusions, loculations +, RLL consolidation  SIGNIFICANT EVENTS: 8/06  admitted for inability to walk chest pain, rhabdomyolysis 8/06  MRI Thoracic spine > no acute significant findings in thoracic spine, tiny t8-t9 disc protusion, RLL pneumonia 8/10  LHC > no CAD 8/11  discharged home on augmentin 8/18  Placement of R CT 8/19  50mg  TPA intrapleural 8/20  Improved CXR post placement of TPA  8/21  R thora > 750cc removed 8/23  R Chest tube removed   SUBJECTIVE:  Pt continues to report L knee / leg pain.  Now describes as a burning and numbness, reports neurontin helped.  Afebrile, VSS.     VITAL SIGNS: Temp:  [98.2 F (36.8 C)-99 F (37.2 C)] 99 F (37.2 C) (08/24 0800) Pulse Rate:  [102-125] 109 (08/24 0800) Resp:  [16-29] 16 (08/24 0800) BP: (153-179)/(97-107) 162/101 mmHg (08/24 0800) SpO2:  [92 %-99 %] 96 % (08/24 0800) Weight:  [221 lb 9 oz (100.5 kg)] 221 lb 9 oz (100.5 kg) (08/24 0100)  INTAKE / OUTPUT:  Intake/Output Summary (Last 24 hours) at 01/07/14 0942 Last data filed at 01/07/14 0800  Gross per 24 hour  Intake   1815 ml  Output   5925 ml  Net  -4110 ml    PHYSICAL EXAMINATION: Gen: wdwn male in NAD HEENT: NCAT, EOMi, poor dentition PULM: diminished on R, CTA on left CV: Tachy, regular, no mgr AB: BS+, soft, nontender Ext: warm, no edema Neuro: AAOx4, speech clear, MAE   LABS:  CBC  Recent  Labs Lab 01/04/14 1105 01/06/14 0400 01/07/14 0340  WBC 10.4 12.2* 13.7*  HGB 12.0* 11.3* 11.3*  HCT 35.6* 34.2* 34.6*  PLT 433* 450* 411*   BMET  Recent Labs Lab 01/05/14 0335 01/06/14 0400 01/07/14 0340  NA 138 140 139  K 3.7 3.8 3.6*  CL 100 102 100  CO2 29 30 29   BUN 4* 5* 5*  CREATININE 0.63 0.65 0.75  GLUCOSE 130* 115* 154*   Electrolytes  Recent Labs Lab 01/05/14 0335 01/06/14 0400 01/07/14 0340  CALCIUM 8.2* 8.6 8.6   Sepsis Markers  Recent Labs Lab 12/31/13 1840  LATICACIDVEN 2.9*    Imaging Dg Chest Port 1 View  01/06/2014   CLINICAL DATA:  Status post right chest tube removal  EXAM: PORTABLE CHEST - 1 VIEW  COMPARISON:  Portable chest x-ray of January 04, 2014  FINDINGS: There has been interval removal of the small caliber chest tube on the right. There remains pleural thickening or fluid along the right lateral thoracic wall and at the right lung base. There is no pneumothorax. The left lung is adequately inflated and grossly clear. The cardiac silhouette is top-normal in size but stable. The central pulmonary vascularity is prominent but also stable.  IMPRESSION: There is no evidence of significant additional pleural fluid reaccumulation nor development of a pneumothorax since removal of the right-sided chest tube.   Electronically  Signed   By: David  Martinique   On: 01/06/2014 12:22     ASSESSMENT / PLAN:  PULMONARY OETT N/A A:  Acute R parapneumonic effusion - greatly improved with TPA (>3 liters), s/p removal of 700cc on thora 8/21, but loculations persist on CT.  Pigtail removed 8/23 HCAP Acute hypoxemic respiratory failure - resolving P:   CXR in AM Titrate supplemental O2 for O2 saturation > 92% Pulmonary hygiene: IS / Flutter valve  TCTS input appreciated, likely for IR drainage   CARDIOVASCULAR A:  Sinus tachycardia - likely due to pain HTN P:  Tele Pain control Lopressor BID   RENAL A:   Hypokalemia P:   Trend BMET  Monitor  UOP Replace electrolytes as needed  INFECTIOUS A:   HCAP with parapneumonic effusion Hx IV drug abuse BCx2 8/17 >ng Pleural Fluid Culture 8/18 > neg Pleural Fluid Culture 8/21 >ng Pleural AFB/Fungal 8/21 >> ngtd>> P:   Vanc 8/17 > 8/20 Cefepime 8/17 >  Note history of IV drug abuse   NEUROLOGIC A:   Agitation, pain  EtOH abuse L Leg / Knee Pain - concern for neuropathic pain, reports burning, some numbness.  ? Femoral nerve strain with hx of falls. P:   Thiamine/folate daily Wean Librium PRN narcotics per primary service Negative plain films of L Knee MRI spine - r/o abscess etc Add knee immobilizer per PT Neurontin TID, increase dose 8/24  TODAY'S SUMMARY:  43 y/o male with polysubstance abuse, heroin use, and parapneumonic effusion with persistent loculations, discussed surgical drainage with Dr. Cyndia Bent. Plan for IR guided drain for remainder of loculation 8/24, may expect some degree of trapped lung in the future.      Noe Gens, NP-C [facility] Pgr: [phone] or [phone]  Reviewed above, examined.  Will ask IR to assess for placement of pig tail catheter in residual pleural fluid collection.  Will get MRI lumbar spine to further assess Lt leg weakness/neuropathic pain.  Chesley Mires, MD [facility] Pulmonary/Critical Care 01/07/2014, 1:06 PM Pager:  [phone] After 3pm call: [phone]

## 2014-01-07 NOTE — H&P (Signed)
Ralph Chavez is an 43 y.o. male.   Chief Complaint: Pt admitted with shortness of breath; chest pain; body aches 12/31/13 Known poly substance abuse +Pneumonia Large Rt loculated pleural effusion Chest tube drain was placed 8/18 with CCM Good output - but removed 8/23 after CT chest showed continued loculated effusion---although much improved. Chest tube placed was high in pleural space- remaining loculation is in lower chest Request made for IR consult for new chest tube drain placement by CCM Dr Laurence Ferrari reviewed imaging. I have seen and examined pt Now scheduled for new Rt chest tube placement  HPI: polysubstance abuse; anxiety  Past Medical History  Diagnosis Date  . Anxiety   . Ulcer     History reviewed. No pertinent past surgical history.  History reviewed. No pertinent family history. Social History:  reports that he has been smoking Cigarettes.  He has been smoking about 0.00 packs per day. He has never used smokeless tobacco. He reports that he drinks alcohol. He reports that he uses illicit drugs (Cocaine and Heroin).  Allergies: No Known Allergies  Medications Prior to Admission  Medication Sig Dispense Refill  . amLODipine (NORVASC) 5 MG tablet Take 1 tablet (5 mg total) by mouth daily.  30 tablet  0  . amoxicillin-clavulanate (AUGMENTIN) 875-125 MG per tablet Take 1 tablet by mouth 2 (two) times daily.  10 tablet  0  . naproxen sodium (ANAPROX) 220 MG tablet Take 440 mg by mouth 2 (two) times daily as needed (pain).      Marland Kitchen omeprazole (PRILOSEC) 20 MG capsule Take 20 mg by mouth daily.      Marland Kitchen oxyCODONE-acetaminophen (ROXICET) 5-325 MG per tablet Take 1 tablet by mouth every 6 (six) hours as needed.  20 tablet  0    Results for orders placed during the hospital encounter of 12/31/13 (from the past 48 hour(s))  BASIC METABOLIC PANEL     Status: Abnormal   Collection Time    01/06/14  4:00 AM      Result Value Ref Range   Sodium 140  137 - 147 mEq/L   Potassium  3.8  3.7 - 5.3 mEq/L   Chloride 102  96 - 112 mEq/L   CO2 30  19 - 32 mEq/L   Glucose, Bld 115 (*) 70 - 99 mg/dL   BUN 5 (*) 6 - 23 mg/dL   Creatinine, Ser 0.65  0.50 - 1.35 mg/dL   Calcium 8.6  8.4 - 10.5 mg/dL   GFR calc non Af Amer >90  >90 mL/min   GFR calc Af Amer >90  >90 mL/min   Comment: (NOTE)     The eGFR has been calculated using the CKD EPI equation.     This calculation has not been validated in all clinical situations.     eGFR's persistently <90 mL/min signify possible Chronic Kidney     Disease.   Anion gap 8  5 - 15  CBC     Status: Abnormal   Collection Time    01/06/14  4:00 AM      Result Value Ref Range   WBC 12.2 (*) 4.0 - 10.5 K/uL   RBC 3.77 (*) 4.22 - 5.81 MIL/uL   Hemoglobin 11.3 (*) 13.0 - 17.0 g/dL   HCT 34.2 (*) 39.0 - 52.0 %   MCV 90.7  78.0 - 100.0 fL   MCH 30.0  26.0 - 34.0 pg   MCHC 33.0  30.0 - 36.0 g/dL   RDW 14.9  11.5 - 15.5 %   Platelets 450 (*) 150 - 400 K/uL  BASIC METABOLIC PANEL     Status: Abnormal   Collection Time    01/07/14  3:40 AM      Result Value Ref Range   Sodium 139  137 - 147 mEq/L   Potassium 3.6 (*) 3.7 - 5.3 mEq/L   Chloride 100  96 - 112 mEq/L   CO2 29  19 - 32 mEq/L   Glucose, Bld 154 (*) 70 - 99 mg/dL   BUN 5 (*) 6 - 23 mg/dL   Creatinine, Ser 0.75  0.50 - 1.35 mg/dL   Calcium 8.6  8.4 - 10.5 mg/dL   GFR calc non Af Amer >90  >90 mL/min   GFR calc Af Amer >90  >90 mL/min   Comment: (NOTE)     The eGFR has been calculated using the CKD EPI equation.     This calculation has not been validated in all clinical situations.     eGFR's persistently <90 mL/min signify possible Chronic Kidney     Disease.   Anion gap 10  5 - 15  CBC     Status: Abnormal   Collection Time    01/07/14  3:40 AM      Result Value Ref Range   WBC 13.7 (*) 4.0 - 10.5 K/uL   RBC 3.79 (*) 4.22 - 5.81 MIL/uL   Hemoglobin 11.3 (*) 13.0 - 17.0 g/dL   HCT 34.6 (*) 39.0 - 52.0 %   MCV 91.3  78.0 - 100.0 fL   MCH 29.8  26.0 - 34.0 pg    MCHC 32.7  30.0 - 36.0 g/dL   RDW 14.9  11.5 - 15.5 %   Platelets 411 (*) 150 - 400 K/uL   Dg Chest Port 1 View  01/06/2014   CLINICAL DATA:  Status post right chest tube removal  EXAM: PORTABLE CHEST - 1 VIEW  COMPARISON:  Portable chest x-ray of January 04, 2014  FINDINGS: There has been interval removal of the small caliber chest tube on the right. There remains pleural thickening or fluid along the right lateral thoracic wall and at the right lung base. There is no pneumothorax. The left lung is adequately inflated and grossly clear. The cardiac silhouette is top-normal in size but stable. The central pulmonary vascularity is prominent but also stable.  IMPRESSION: There is no evidence of significant additional pleural fluid reaccumulation nor development of a pneumothorax since removal of the right-sided chest tube.   Electronically Signed   By: David  Martinique   On: 01/06/2014 12:22    Review of Systems  Constitutional: Negative for fever and weight loss.  Respiratory: Positive for cough and shortness of breath.   Cardiovascular: Positive for chest pain.  Gastrointestinal: Negative for nausea and vomiting.  Musculoskeletal: Positive for back pain and joint pain.  Neurological: Positive for weakness.  Psychiatric/Behavioral: Positive for substance abuse.    Blood pressure 162/101, pulse 112, temperature 98.3 F (36.8 C), temperature source Oral, resp. rate 20, height 5' 10"  (1.778 m), weight 100.5 kg (221 lb 9 oz), SpO2 96.00%. Physical Exam   Assessment/Plan Rt pleural effusion chest tube placement scheduled in IR 8/25 Pt aware of procedure benefits and risks and agreeable to proceed Consent signed and in chart Hep injs held  [place] A 01/07/2014, 1:30 PM

## 2014-01-08 ENCOUNTER — Inpatient Hospital Stay (HOSPITAL_COMMUNITY): Payer: Self-pay

## 2014-01-08 ENCOUNTER — Encounter (HOSPITAL_COMMUNITY): Payer: Self-pay | Admitting: Radiology

## 2014-01-08 LAB — PROTIME-INR
INR: 1.03 (ref 0.00–1.49)
Prothrombin Time: 13.5 seconds (ref 11.6–15.2)

## 2014-01-08 LAB — APTT: aPTT: 47 seconds — ABNORMAL HIGH (ref 24–37)

## 2014-01-08 MED ORDER — MORPHINE SULFATE 2 MG/ML IJ SOLN
2.0000 mg | INTRAMUSCULAR | Status: DC | PRN
  Administered 2014-01-08 (×4): 4 mg via INTRAVENOUS
  Administered 2014-01-08: 2 mg via INTRAVENOUS
  Administered 2014-01-09 (×6): 4 mg via INTRAVENOUS
  Administered 2014-01-09: 2 mg via INTRAVENOUS
  Administered 2014-01-09 – 2014-01-10 (×6): 4 mg via INTRAVENOUS
  Administered 2014-01-11 (×2): 2 mg via INTRAVENOUS
  Administered 2014-01-11: 4 mg via INTRAVENOUS
  Administered 2014-01-11: 2 mg via INTRAVENOUS
  Administered 2014-01-11 – 2014-01-12 (×3): 4 mg via INTRAVENOUS
  Filled 2014-01-08: qty 1
  Filled 2014-01-08 (×2): qty 2
  Filled 2014-01-08: qty 1
  Filled 2014-01-08 (×8): qty 2
  Filled 2014-01-08: qty 1
  Filled 2014-01-08 (×2): qty 2
  Filled 2014-01-08: qty 1
  Filled 2014-01-08: qty 2
  Filled 2014-01-08: qty 1
  Filled 2014-01-08 (×2): qty 2
  Filled 2014-01-08: qty 1
  Filled 2014-01-08 (×6): qty 2

## 2014-01-08 MED ORDER — MORPHINE SULFATE 4 MG/ML IJ SOLN
INTRAMUSCULAR | Status: AC
  Administered 2014-01-08: 11:00:00
  Filled 2014-01-08: qty 1

## 2014-01-08 MED ORDER — METOPROLOL TARTRATE 50 MG PO TABS
50.0000 mg | ORAL_TABLET | Freq: Two times a day (BID) | ORAL | Status: DC
  Administered 2014-01-08 – 2014-01-11 (×7): 50 mg via ORAL
  Filled 2014-01-08: qty 2
  Filled 2014-01-08 (×3): qty 1
  Filled 2014-01-08: qty 2
  Filled 2014-01-08 (×2): qty 1
  Filled 2014-01-08: qty 2
  Filled 2014-01-08 (×2): qty 1
  Filled 2014-01-08: qty 2

## 2014-01-08 MED ORDER — METOPROLOL TARTRATE 1 MG/ML IV SOLN
2.5000 mg | INTRAVENOUS | Status: DC | PRN
  Administered 2014-01-09: 2.5 mg via INTRAVENOUS
  Filled 2014-01-08: qty 5

## 2014-01-08 MED ORDER — FENTANYL CITRATE 0.05 MG/ML IJ SOLN
INTRAMUSCULAR | Status: AC | PRN
  Administered 2014-01-08: 50 ug via INTRAVENOUS

## 2014-01-08 MED ORDER — MORPHINE SULFATE 2 MG/ML IJ SOLN: 2.0000 mg | INTRAMUSCULAR | Status: DC | PRN

## 2014-01-08 MED ORDER — FENTANYL CITRATE 0.05 MG/ML IJ SOLN
INTRAMUSCULAR | Status: AC
  Filled 2014-01-08: qty 6

## 2014-01-08 MED ORDER — MIDAZOLAM HCL 2 MG/2ML IJ SOLN
INTRAMUSCULAR | Status: AC
  Filled 2014-01-08: qty 6

## 2014-01-08 MED ORDER — MIDAZOLAM HCL 2 MG/2ML IJ SOLN
INTRAMUSCULAR | Status: AC | PRN
  Administered 2014-01-08 (×2): 1 mg via INTRAVENOUS

## 2014-01-08 NOTE — Procedures (Signed)
Successful 10FR RT CHEST TUBE INSERTION NO COMP STABLE KEEP TO 20CM H20 LOW WALL SUCTION

## 2014-01-08 NOTE — Progress Notes (Signed)
Physical Therapy Treatment Patient Details Name: Ralph Chavez MRN: [id number] DOB: 1970/06/14 Today's Date: 01/08/2014    History of Present Illness 43 yo male admitted 12/31/13 with SOB, found to have  Pleural effusion, Chest tube removed 01/06/14. Recently in hospital for fall, rhabdo. Hx substance abuse. Pigtail cathether placement scheduled for 8/25    PT Comments    Progressing with mobility. L LE pain, weakness still present. Hip flexion 2/5; pt could not actively perform LAQ/knee ext nor could he maintain knee extension if lower leg was raised for him. Hip flexor and quad weakness still evident during MMT. However, very little knee buckling observed during functional activity/ambulation. Pain could be a factor. Could consider ortho/neurology consult and bracing for safety with ambulation. KI would help but unsure if pt will comply with this at d/c. Hinged bledsoe brace could be beneficial as well.   Follow Up Recommendations  Outpatient PT (if possible-insurance likely an issue)     Equipment Recommendations  Rolling walker with 5" wheels (could consider KI vs hinged bledsoe brace. Could also consider crutches. )    Recommendations for Other Services       Precautions / Restrictions Precautions Precautions: Fall Precaution Comments: L LE instability/weakness.  Restrictions Weight Bearing Restrictions: No    Mobility  Bed Mobility Overal bed mobility: Modified Independent                Transfers Overall transfer level: Needs assistance Equipment used: None Transfers: Sit to/from Stand Sit to Stand: Min guard         General transfer comment: close guard for safety.   Ambulation/Gait Ambulation/Gait assistance: Min guard Ambulation Distance (Feet): 600 Feet (20' without device or with IV pole) Assistive device: Rolling walker (2 wheeled) Gait Pattern/deviations: Step-through pattern;Antalgic;Trunk flexed;Decreased stride length     General Gait Details:  Initially used IV pole only/no device-increased difficulty noted. Improved stability overall with use of walker. Very little knee buckling during ambulation with walker. Pt did endorse pain, tightness in L LE.    Stairs            Wheelchair Mobility    Modified Rankin (Stroke Patients Only)       Balance           Standing balance support: During functional activity Standing balance-Leahy Scale: Fair Standing balance comment: needs UE support for ambulation for safety. Can maintain static standing/take a few steps without device however pt does limit WBing on L LE at times. Also able to maintain brief (1-2 seconds) of L SLS without buckling, with bil UE support.                     Cognition Arousal/Alertness: Awake/alert Behavior During Therapy: WFL for tasks assessed/performed (easily agitated at times but much better overall this session) Overall Cognitive Status: Within Functional Limits for tasks assessed                      Exercises      General Comments        Pertinent Vitals/Pain Pain Assessment: 0-10 Pain Score: 10-Worst pain ever Pain Location: L medial knee/thigh area. Light touch hypersensitivity per pt in addition to descriptors below Pain Descriptors / Indicators: Burning;Tingling Pain Intervention(s): Repositioned;Monitored during session    Home Living                      Prior Function  PT Goals (current goals can now be found in the care plan section) Progress towards PT goals: Progressing toward goals    Frequency  Min 3X/week    PT Plan Current plan remains appropriate    Co-evaluation             End of Session   Activity Tolerance: Patient limited by pain Patient left: in chair;with call bell/phone within reach     Time: 0911-0947 PT Time Calculation (min): 36 min  Charges:  $Gait Training: 23-37 mins                    G Codes:      Weston Anna, MPT Pager:  [phone]

## 2014-01-08 NOTE — Progress Notes (Signed)
ANTIBIOTIC CONSULT NOTE - FOLLOW UP  Pharmacy Consult for Cefepime Indication: HCAP  No Known Allergies  Patient Measurements: Height: 5\' 10"  (177.8 cm) Weight: 213 lb 10 oz (96.9 kg) IBW/kg (Calculated) : 73  Vital Signs: Temp: 99.2 F (37.3 C) (08/25 0400) Temp src: Oral (08/25 0400) BP: 149/91 mmHg (08/25 0435) Pulse Rate: 115 (08/25 0756) Intake/Output from previous day: 08/24 0701 - 08/25 0700 In: 2650 [P.O.:1300; I.V.:1200; IV Piggyback:150] Out: 7275 [Urine:7275] Intake/Output from this shift:    Labs:  Recent Labs  01/06/14 0400 01/07/14 0340  WBC 12.2* 13.7*  HGB 11.3* 11.3*  PLT 450* 411*  CREATININE 0.65 0.75   Estimated Creatinine Clearance: 142 ml/min (by C-G formula based on Cr of 0.75). No results found for this basename: VANCOTROUGH, Corlis Leak, VANCORANDOM, GENTTROUGH, GENTPEAK, GENTRANDOM, TOBRATROUGH, TOBRAPEAK, TOBRARND, AMIKACINPEAK, AMIKACINTROU, AMIKACIN,  in the last 72 hours   Microbiology: Recent Results (from the past 720 hour(s))  MRSA PCR SCREENING     Status: None   Collection Time    12/20/13  7:56 AM      Result Value Ref Range Status   MRSA by PCR NEGATIVE  NEGATIVE Final   Comment:            The GeneXpert MRSA Assay (FDA     approved for NASAL specimens     only), is one component of a     comprehensive MRSA colonization     surveillance program. It is not     intended to diagnose MRSA     infection nor to guide or     monitor treatment for     MRSA infections.  CULTURE, EXPECTORATED SPUTUM-ASSESSMENT     Status: None   Collection Time    12/21/13  9:15 AM      Result Value Ref Range Status   Specimen Description SPUTUM   Final   Special Requests NONE   Final   Sputum evaluation     Final   Value: MICROSCOPIC FINDINGS SUGGEST THAT THIS SPECIMEN IS NOT REPRESENTATIVE OF LOWER RESPIRATORY SECRETIONS. PLEASE RECOLLECT.     Gram Stain Report Called to,Read Back By and Verified With: Beecher, M AT 0942 ON 12/21/13 BY HOBBINS,  J.   Report Status 12/21/2013 FINAL   Final  CULTURE, BLOOD (ROUTINE X 2)     Status: None   Collection Time    12/31/13  6:30 PM      Result Value Ref Range Status   Specimen Description BLOOD LEFT WRIST   Final   Special Requests BOTTLES DRAWN AEROBIC AND ANAEROBIC 6 ML   Final   Culture  Setup Time     Final   Value: 12/31/2013 22:37     Performed at [facility]   Culture     Final   Value: NO GROWTH 5 DAYS     Performed at [facility]   Report Status 01/06/2014 FINAL   Final  CULTURE, BLOOD (ROUTINE X 2)     Status: None   Collection Time    12/31/13  6:40 PM      Result Value Ref Range Status   Specimen Description BLOOD RIGHT HAND   Final   Special Requests BOTTLES DRAWN AEROBIC AND ANAEROBIC 4 ML   Final   Culture  Setup Time     Final   Value: 12/31/2013 22:38     Performed at [facility]   Culture     Final   Value: NO GROWTH  5 DAYS     Performed at [facility]   Report Status 01/06/2014 FINAL   Final  MRSA PCR SCREENING     Status: None   Collection Time    01/01/14  1:51 AM      Result Value Ref Range Status   MRSA by PCR NEGATIVE  NEGATIVE Final   Comment:            The GeneXpert MRSA Assay (FDA     approved for NASAL specimens     only), is one component of a     comprehensive MRSA colonization     surveillance program. It is not     intended to diagnose MRSA     infection nor to guide or     monitor treatment for     MRSA infections.  GRAM STAIN     Status: None   Collection Time    01/01/14 10:05 AM      Result Value Ref Range Status   Specimen Description PLEURAL   Final   Special Requests NONE   Final   Gram Stain     Final   Value: CYTOSPIN     WBC PRESENT,BOTH PMN AND MONONUCLEAR     NO ORGANISMS SEEN     Gram Stain Report Called to,Read Back By and Verified With: A. ARNOLD RN AT 1050 ON 08.18.15 BY SHUEA   Report Status 01/01/2014 FINAL   Final  BODY FLUID CULTURE     Status: None   Collection Time     01/01/14 10:05 AM      Result Value Ref Range Status   Specimen Description PLEURAL   Final   Special Requests Normal   Final   Gram Stain     Final   Value: CYTOSPIN SLIDE WBC PRESENT,BOTH PMN AND MONONUCLEAR     NO ORGANISMS SEEN     Gram Stain Report Called to,Read Back By and Verified With: Gram Stain Report Called to,Read Back By and Verified With: A ARNOLD RN 1050AM 01/01/14 BY SHUEA Performed at [facility]     Performed at [facility]   Culture     Final   Value: NO GROWTH 3 DAYS     Performed at [facility]   Report Status 01/04/2014 FINAL   Final  AFB CULTURE WITH SMEAR     Status: None   Collection Time    01/04/14  9:52 AM      Result Value Ref Range Status   Specimen Description PLEURAL   Final   Special Requests Normal   Final   Acid Fast Smear     Final   Value: NO ACID FAST BACILLI SEEN     Performed at [facility]   Culture     Final   Value: CULTURE WILL BE EXAMINED FOR 6 WEEKS BEFORE ISSUING A FINAL REPORT     Performed at [facility]   Report Status PENDING   Incomplete  BODY FLUID CULTURE     Status: None   Collection Time    01/04/14  9:52 AM      Result Value Ref Range Status   Specimen Description PLEURAL   Final   Special Requests Normal   Final   Gram Stain     Final   Value: RARE WBC PRESENT, PREDOMINANTLY PMN     NO ORGANISMS SEEN     Performed at [facility]  Final   Value: NO GROWTH 3 DAYS     Performed at [facility]   Report Status 01/07/2014 FINAL   Final  FUNGUS CULTURE W SMEAR     Status: None   Collection Time    01/04/14  9:52 AM      Result Value Ref Range Status   Specimen Description PLEURAL   Final   Special Requests NONE   Final   Fungal Smear     Final   Value: NO YEAST OR FUNGAL ELEMENTS SEEN     Performed at [facility]   Culture     Final   Value: CULTURE IN PROGRESS FOR FOUR WEEKS     Performed at [facility]   Report Status  PENDING   Incomplete    Assessment: 77 yoM with hx polysubstance abuse and recent admission for rhabdomyolysis and aspiration PNA [facility] with possible recurrent PNA vs pleural effusion. Broad spectrum antibiotics started for presumed PNA.   8/17 >> Zosyn x 1 8/17 >> Vanc >> 8/20 8/17 >> Cefepime >>   Tmax: 99.2 WBCs: rising, 13.7 (8/24) Renal: SCr stable, CrCl >100 (N and CG)  8/17 blood: NGF 8/17 MRSA PCR: neg 8/17 strep pneumo ur ag: neg 8/17 legionella ur ag: neg 8/18 pleural fluid: NGF 8/21 pleural fluid: NGTD. No AFB or fungus.  8/25: Day #9 antibiotics. Thoracentesis performed 8/21 with 750cc fluid drained, chest tube removed 8/23. Now planning for right chest tube placement by IR on 8/25.   Goal of Therapy:  Eradication of infection, dosing appropriate for indication/renal function  Plan:   Continue Cefepime 1g IV V7Q  Uncertain of planned duration of therapy  Peggyann Juba, PharmD, BCPS Pager: [phone] 01/08/2014,8:02 AM

## 2014-01-08 NOTE — Progress Notes (Signed)
PULMONARY / CRITICAL CARE MEDICINE   Name: Ralph Chavez MRN: [id number] DOB: 1971/03/03    ADMISSION DATE:  12/31/2013 CONSULTATION DATE:  01/01/2014  REFERRING MD :  Dyann Kief TR[facility]  CHIEF COMPLAINT:  Shortness of breath, pain all over  INITIAL PRESENTATION: 43 y/o male with a history of polysubstance abuse admitted on 8/17 with increasing dyspnea and a large R pleural effusion after a recent episode of RLL pneumonia.  STUDIES:  8/06 MRI thoracic spine > no acute findings, tiny T8-9 disc protrusion w/o impingement, R lung airspace disease 8/18 CT chest > large R pleural effusion with compressive atelectasis of R lung, some left shift noted 8/21 CT chest > rt effusions, loculations +, RLL consolidation 8/24 MRI Lumbar > tiny annular fissure at L5-S1 on LEFT   SIGNIFICANT EVENTS: 8/06  admitted for inability to walk chest pain, rhabdomyolysis 8/06  MRI Thoracic spine > no acute significant findings in thoracic spine, tiny t8-t9 disc protusion, RLL pneumonia 8/10  LHC > no CAD 8/11  discharged home on augmentin 8/18  Placement of R CT 8/19  50mg  TPA intrapleural 8/20  Improved CXR post placement of TPA  8/21  R thora > 750cc removed 8/23  R Chest tube removed   SUBJECTIVE:  MRI reviewed,  Pt continues to c/o pain.  Intermittently verbally abusive to staff.  Pending CT placement     VITAL SIGNS: Temp:  [98.2 F (36.8 C)-99.6 F (37.6 C)] 99.2 F (37.3 C) (08/25 0400) Pulse Rate:  [35-120] 115 (08/25 0756) Resp:  [14-23] 22 (08/25 0440) BP: (149-170)/(91-121) 149/91 mmHg (08/25 0435) SpO2:  [89 %-100 %] 95 % (08/25 0756) Weight:  [213 lb 10 oz (96.9 kg)] 213 lb 10 oz (96.9 kg) (08/25 0328)  INTAKE / OUTPUT:  Intake/Output Summary (Last 24 hours) at 01/08/14 0347 Last data filed at 01/08/14 0700  Gross per 24 hour  Intake   2400 ml  Output   7275 ml  Net  -4875 ml    PHYSICAL EXAMINATION: Gen: wdwn male in NAD HEENT: NCAT, EOMi, poor dentition PULM: diminished on R,  CTA on left CV: Tachy, regular, no mgr AB: BS+, soft, nontender Ext: warm, no edema Neuro: AAOx4, speech clear, MAE   LABS:  CBC  Recent Labs Lab 01/04/14 1105 01/06/14 0400 01/07/14 0340  WBC 10.4 12.2* 13.7*  HGB 12.0* 11.3* 11.3*  HCT 35.6* 34.2* 34.6*  PLT 433* 450* 411*   BMET  Recent Labs Lab 01/05/14 0335 01/06/14 0400 01/07/14 0340  NA 138 140 139  K 3.7 3.8 3.6*  CL 100 102 100  CO2 29 30 29   BUN 4* 5* 5*  CREATININE 0.63 0.65 0.75  GLUCOSE 130* 115* 154*   Electrolytes  Recent Labs Lab 01/05/14 0335 01/06/14 0400 01/07/14 0340  CALCIUM 8.2* 8.6 8.6   Sepsis Markers No results found for this basename: LATICACIDVEN, PROCALCITON, O2SATVEN,  in the last 168 hours  Imaging Mr Lumbar Spine Wo Contrast  01/07/2014   CLINICAL DATA:  LEFT leg burning and weakness. Evaluate for possible abscess.  EXAM: MRI LUMBAR SPINE WITHOUT CONTRAST  TECHNIQUE: Multiplanar, multisequence MR imaging of the lumbar spine was performed. No intravenous contrast was administered.  COMPARISON:  CT lumbar spine performed 12/20/2013.  FINDINGS: Anatomic alignment. Normal conus. Hypercellular marrow could indicate chronic disease, smoking, or be related to increased body habitus. No focal areas of marrow replacement.  No appreciable asymmetry of size or signal of the psoas muscles.  Normal conus.  No intraspinal mass  lesion.  At L1-2, there are Schmorl's nodes with mild annular bulging but no impingement.  At L2-3, there is mild annular bulging and mild facet arthropathy without impingement.  The L3-4, and L4-5 disc spaces appear unremarkable.  At L5-S1, there is a tiny annular fissure in the LEFT foramen without frank disc protrusion. Transitional anatomy with partially fused facet joint on the RIGHT at this level.  IMPRESSION: Tiny annular fissure at L5-S1 on the LEFT. It is unclear if this could be symptomatic.  No appreciable asymmetry in size or signal of the psoas muscles.    Electronically Signed   By: Rolla Flatten M.D.   On: 01/07/2014 20:49     ASSESSMENT / PLAN:  PULMONARY OETT N/A A:  Acute R parapneumonic effusion - greatly improved with TPA (>3 liters), s/p removal of 700cc on thora 8/21, but loculations persist on CT.  Pigtail removed 8/23.  Pending CT placement for lower drainage 8/25 HCAP Acute hypoxemic respiratory failure - resolving P:   CXR in AM Titrate supplemental O2 for O2 saturation > 92% Pulmonary hygiene: IS / Flutter valve  Await IR CT placement for residual drainage of fluid  CARDIOVASCULAR A:  Sinus tachycardia - likely due to pain HTN P:  Tele Pain control Lopressor BID, increase dose to 50 BID 8/25 PRN lopressor for SBP > 180  RENAL A:   Hypokalemia P:   Trend BMET  Monitor UOP Replace electrolytes as needed  INFECTIOUS A:   HCAP with parapneumonic effusion Hx IV drug abuse BCx2 8/17 >ng Pleural Fluid Culture 8/18 > neg Pleural Fluid Culture 8/21 >ng Pleural AFB/Fungal 8/21 >> ngtd>> P:   Vanc 8/17 > 8/20 Cefepime 8/17 >  Note history of IV drug abuse   NEUROLOGIC A:   Agitation, pain  EtOH abuse L Leg / Knee Pain - concern for neuropathic pain, reports burning, some numbness.  ? Femoral nerve strain with hx of falls. P:   Thiamine/folate daily Wean Librium PRN narcotics per primary service Negative plain films of L Knee MRI spine without significant defect, ? If annular fissure would produce pt's reported sx Knee immobilizer per PT Neurontin TID, increase dose 8/24 PRN percocet, adjust morphine dosing to Q4  TODAY'S SUMMARY:  43 y/o male with polysubstance abuse, heroin use, and parapneumonic effusion with persistent loculations, discussed surgical drainage with Dr. Cyndia Bent. Plan for IR guided drain for remainder of loculation 8/25, may expect some degree of trapped lung in the future.    Transfer back to [facility] as of 8/26 am.  PCCM will remain on case as pulmonary consultants.   Noe Gens,  NP-C [facility] Pgr: [phone] or [phone]  Reviewed above, and examined.  MRI lumbar spine unrevealing for cause of his Lt leg symptoms.  He is to have pigtail catheter placed by IR this AM >> resume diet after this.  Will ask [facility] to assume medical management from 8/26.  PCCM will continue to follow to assist in management of pleural effusion.  Chesley Mires, MD [facility] Pulmonary/Critical Care 01/08/2014, 9:28 AM Pager:  [phone] After 3pm call: [phone]

## 2014-01-08 NOTE — Progress Notes (Signed)
CSW continuing to follow.  Pt not yet medically ready for discharge.  CSW to provide resources once pt nears being ready for discharge.  CSW to continue to follow.  Alison Murray, MSW, [place] [employer] [phone]

## 2014-01-08 NOTE — Progress Notes (Signed)
Nutrition Brief Note  Patient identified on the Malnutrition Screening Tool (MST) Report  Wt Readings from Last 15 Encounters:  01/08/14 213 lb 10 oz (96.9 kg)  12/25/13 220 lb 9.6 oz (100.064 kg)  12/25/13 220 lb 9.6 oz (100.064 kg)    Body mass index is 30.65 kg/(m^2). Patient meets criteria for class I obesity based on current BMI.   Current diet order is regular, patient is consuming approximately 100% of meals at this time. Labs and medications reviewed. Pt with history of anxiety, substance abuse. The patient presented with complaints of increasing work of breathing and generalized weakness and lethargy. Found to have acute parapneumonic effusion, HCAP, and acute hypoxemic respiratory failure.   - Pt homeless PTA but reported to Education officer, museum that he knows where to find hot meals - Reported he was eating as much as he wanted to PTA, getting at least 1 meal/day - Said he's had some recent weight gain, unsure how much - PO intake 100% of meals - RN reports a friend of his brought in some cake yesterday - Has soda and candy in the room - Complained about the small portion sizes of meals on the trays    No nutrition interventions warranted at this time. If nutrition issues arise, please consult RD.   Carlis Stable MS, [place], LDN [phone] Pager [phone] Weekend/After Hours Pager

## 2014-01-09 ENCOUNTER — Inpatient Hospital Stay (HOSPITAL_COMMUNITY): Payer: Self-pay

## 2014-01-09 LAB — BASIC METABOLIC PANEL
Anion gap: 10 (ref 5–15)
BUN: 7 mg/dL (ref 6–23)
CHLORIDE: 98 meq/L (ref 96–112)
CO2: 30 mEq/L (ref 19–32)
Calcium: 9.1 mg/dL (ref 8.4–10.5)
Creatinine, Ser: 0.7 mg/dL (ref 0.50–1.35)
GFR calc Af Amer: >90 mL/min (ref >90)
GFR calc non Af Amer: >90 mL/min (ref >90)
Glucose, Bld: 137 mg/dL — ABNORMAL HIGH (ref 70–99)
POTASSIUM: 3.8 meq/L (ref 3.7–5.3)
SODIUM: 138 meq/L (ref 137–147)

## 2014-01-09 LAB — CBC
HCT: 35.4 % — ABNORMAL LOW (ref 39.0–52.0)
HEMOGLOBIN: 11.4 g/dL — AB (ref 13.0–17.0)
MCH: 29.6 pg (ref 26.0–34.0)
MCHC: 32.2 g/dL (ref 30.0–36.0)
MCV: 91.9 fL (ref 78.0–100.0)
Platelets: 454 10*3/uL — ABNORMAL HIGH (ref 150–400)
RBC: 3.85 MIL/uL — AB (ref 4.22–5.81)
RDW: 14.8 % (ref 11.5–15.5)
WBC: 10.5 10*3/uL (ref 4.0–10.5)

## 2014-01-09 MED ORDER — HYDRALAZINE HCL 10 MG PO TABS
10.0000 mg | ORAL_TABLET | Freq: Three times a day (TID) | ORAL | Status: DC
  Administered 2014-01-09 – 2014-01-11 (×6): 10 mg via ORAL
  Filled 2014-01-09 (×11): qty 1

## 2014-01-09 NOTE — Progress Notes (Signed)
PULMONARY / CRITICAL CARE MEDICINE   Name: Ralph Chavez MRN: [id number] DOB: [birth year]    ADMISSION DATE:  12/31/2013 CONSULTATION DATE:  01/01/2014  REFERRING MD :  Dyann Kief TRH  CHIEF COMPLAINT:  Shortness of breath, pain all over  INITIAL PRESENTATION: 43 y/o male with a history of polysubstance abuse admitted on 8/17 with increasing dyspnea and a large R pleural effusion after a recent episode of RLL pneumonia.  STUDIES:  8/06 MRI thoracic spine > no acute findings, tiny T8-9 disc protrusion w/o impingement, R lung airspace disease 8/06 MRI Thoracic spine > no acute significant findings in thoracic spine, tiny t8-t9 disc protusion, RLL pneumonia 8/10 LHC > no CAD 8/18 CT chest > large R pleural effusion with compressive atelectasis of R lung, some left shift noted 8/21 CT chest > rt effusions, loculations +, RLL consolidation 8/21 R thora > 750cc removed 8/24 MRI Lumbar > tiny annular fissure at L5-S1 on LEFT  SIGNIFICANT EVENTS: 8/06  admitted for inability to walk chest pain, rhabdomyolysis 8/11  discharged home on augmentin 8/18  Placement of R CT 8/19  50mg  TPA intrapleural 8/20  Improved CXR post placement of TPA  8/23  R Chest tube removed 8/25  IR placed Rt chest tube  SUBJECTIVE:   Breathing better.  Mild chest discomfort at chest tube site.  Still has difficulty taking deep breaths.  VITAL SIGNS: Temp:  [98.5 F (36.9 C)-99.2 F (37.3 C)] 98.6 F (37 C) (08/26 0346) Pulse Rate:  [98-118] 113 (08/25 2114) Resp:  [14-24] 17 (08/26 0632) BP: (116-180)/(84-118) 180/118 mmHg (08/26 0632) SpO2:  [92 %-100 %] 94 % (08/26 9741) Weight:  [212 lb 8.4 oz (96.4 kg)] 212 lb 8.4 oz (96.4 kg) (08/26 0346)  INTAKE / OUTPUT:  Intake/Output Summary (Last 24 hours) at 01/09/14 0849 Last data filed at 01/09/14 0600  Gross per 24 hour  Intake   2580 ml  Output   4875 ml  Net  -2295 ml    PHYSICAL EXAMINATION: Gen: no distress HEENT: no sinus tenderness PULM:  decreased breath sounds, Rt chest tube in place CV: regular, tachycardic AB: soft, non tender Ext: no edema Neuro: alert, improved strength Lt leg  LABS:  CBC  Recent Labs Lab 01/06/14 0400 01/07/14 0340 01/09/14 0335  WBC 12.2* 13.7* 10.5  HGB 11.3* 11.3* 11.4*  HCT 34.2* 34.6* 35.4*  PLT 450* 411* 454*   BMET  Recent Labs Lab 01/06/14 0400 01/07/14 0340 01/09/14 0335  NA 140 139 138  K 3.8 3.6* 3.8  CL 102 100 98  CO2 30 29 30   BUN 5* 5* 7  CREATININE 0.65 0.75 0.70  GLUCOSE 115* 154* 137*   Electrolytes  Recent Labs Lab 01/06/14 0400 01/07/14 0340 01/09/14 0335  CALCIUM 8.6 8.6 9.1   Imaging Ct Image Guided Fluid Drain By Catheter  01/08/2014   CLINICAL DATA:  Loculated right pleural effusion  EXAM: CT-GUIDED RIGHT POSTERIOR 10 FRENCH CHEST TUBE INSERTION  Date:  8/25/20158/25/2015 2:05 pm  Radiologist:  M. Daryll Brod, MD  Guidance:  CT  FLUOROSCOPY TIME:  None.  MEDICATIONS AND MEDICAL HISTORY: 2 mg Versed, 50 mcg fentanyl  ANESTHESIA/SEDATION: 15 min  CONTRAST:  None.  COMPLICATIONS: No immediate  PROCEDURE: Informed consent was obtained from the patient following explanation of the procedure, risks, benefits and alternatives. The patient understands, agrees and consents for the procedure. All questions were addressed. A time out was performed.  Maximal barrier sterile technique utilized including caps, mask, sterile gowns, sterile  gloves, large sterile drape, hand hygiene, and Betadine.  Previous imaging reviewed. Patient positioned right anterior oblique. Noncontrast localization CT performed. The posterior loculated right pleural effusion was localized. Under sterile conditions and local anesthesia, an 18 gauge introducer needle was advanced from a lateral intercostal approach into the fluid collection. Needle position confirmed with CT. Syringe aspiration yielded pleural fluid. Guidewire advanced followed by dilatation to insert a 10 [place] drain. Catheter  position confirmed with CT. No immediate complication. Patient tolerated the procedure well. Catheter secured with a Prolene suture and connected to external pleura vac.  IMPRESSION: Successful CT-guided 10 French right chest drain insertion for a loculated pleural effusion.   Electronically Signed   By: Daryll Brod M.D.   On: 01/08/2014 15:27     Impression: 43 yo male smoker with HCAP and Rt parapneumonic pleural effusion.  Hx of IVDA, ETOH.  HCAP. Plan: Day 10 of Abx, currently on cefepime  Rt pleural fluid AFB 8/21 >> Rt pleural fluid fungal cx 8/21 >>   Loculated Rt parapneumonic pleural effusion. Plan: Change chest tube to -10 suction F/u CXR 8/27  Acute hypoxic respiratory failure 2nd to HCAP >> resolved. Plan: Monitor oxygenation Bronchial hygiene  Tobacco abuse. Plan: Smoking cessation PRN BDs  Updated pt's mother over the phone.  Okay to transfer out of SDU from pulmonary standpoint.  Chesley Mires, MD [facility] Pulmonary/Critical Care 01/09/2014, 8:56 AM Pager:  [phone] After 3pm call: [phone]

## 2014-01-09 NOTE — Progress Notes (Signed)
Subjective: Pt sitting up in chair; seems frustrated; very abrupt with responses; has some mild soreness at rt chest tube site   Objective: Vital signs in last 24 hours: Temp:  [98.2 F (36.8 C)-99.2 F (37.3 C)] 98.2 F (36.8 C) (08/26 0800) Pulse Rate:  [98-118] 113 (08/25 2114) Resp:  [14-24] 17 (08/26 0632) BP: (116-180)/(84-118) 180/118 mmHg (08/26 0632) SpO2:  [92 %-100 %] 94 % (08/26 8299) Weight:  [212 lb 8.4 oz (96.4 kg)] 212 lb 8.4 oz (96.4 kg) (08/26 0346) Last BM Date: 01/03/14  Intake/Output from previous day: 08/25 0701 - 08/26 0700 In: 2630 [P.O.:1380; I.V.:1150; IV Piggyback:100] Out: 3716 [Urine:4100; Chest Tube:775] Intake/Output this shift:  Rt chest tube intact, insertion site mildly tender, no air leak, output 775 cc's blood-tinged fluid    Lab Results:   Recent Labs  01/07/14 0340 01/09/14 0335  WBC 13.7* 10.5  HGB 11.3* 11.4*  HCT 34.6* 35.4*  PLT 411* 454*   BMET  Recent Labs  01/07/14 0340 01/09/14 0335  NA 139 138  K 3.6* 3.8  CL 100 98  CO2 29 30  GLUCOSE 154* 137*  BUN 5* 7  CREATININE 0.75 0.70  CALCIUM 8.6 9.1   PT/INR  Recent Labs  01/08/14 0335  LABPROT 13.5  INR 1.03   ABG No results found for this basename: PHART, PCO2, PO2, HCO3,  in the last 72 hours  Studies/Results: Mr Lumbar Spine Wo Contrast  01/07/2014   CLINICAL DATA:  LEFT leg burning and weakness. Evaluate for possible abscess.  EXAM: MRI LUMBAR SPINE WITHOUT CONTRAST  TECHNIQUE: Multiplanar, multisequence MR imaging of the lumbar spine was performed. No intravenous contrast was administered.  COMPARISON:  CT lumbar spine performed 12/20/2013.  FINDINGS: Anatomic alignment. Normal conus. Hypercellular marrow could indicate chronic disease, smoking, or be related to increased body habitus. No focal areas of marrow replacement.  No appreciable asymmetry of size or signal of the psoas muscles.  Normal conus.  No intraspinal mass lesion.  At L1-2, there are  Schmorl's nodes with mild annular bulging but no impingement.  At L2-3, there is mild annular bulging and mild facet arthropathy without impingement.  The L3-4, and L4-5 disc spaces appear unremarkable.  At L5-S1, there is a tiny annular fissure in the LEFT foramen without frank disc protrusion. Transitional anatomy with partially fused facet joint on the RIGHT at this level.  IMPRESSION: Tiny annular fissure at L5-S1 on the LEFT. It is unclear if this could be symptomatic.  No appreciable asymmetry in size or signal of the psoas muscles.   Electronically Signed   By: Rolla Flatten M.D.   On: 01/07/2014 20:49   Dg Chest Port 1 View  01/09/2014   CLINICAL DATA:  Pleural effusion  EXAM: PORTABLE CHEST - 1 VIEW  COMPARISON:  January 06, 2014  FINDINGS: There is a chest tube on the right with significant interval resolution of pleural effusion. A small pleural effusion remains on the right. There is moderate generalized interstitial edema with cardiomegaly. No airspace consolidation. No bony lesions. No apparent pneumothorax.  IMPRESSION: Chest tube at right base. There is evidence of congestive heart failure with edema and cardiomegaly. There is a small residual effusion on the right. No pneumothorax apparent   Electronically Signed   By: Lowella Grip M.D.   On: 01/09/2014 07:00   Ct Image Guided Fluid Drain By Catheter  01/08/2014   CLINICAL DATA:  Loculated right pleural effusion  EXAM: CT-GUIDED RIGHT POSTERIOR 10 FRENCH  CHEST TUBE INSERTION  Date:  8/25/20158/25/2015 2:05 pm  Radiologist:  M. Daryll Brod, MD  Guidance:  CT  FLUOROSCOPY TIME:  None.  MEDICATIONS AND MEDICAL HISTORY: 2 mg Versed, 50 mcg fentanyl  ANESTHESIA/SEDATION: 15 min  CONTRAST:  None.  COMPLICATIONS: No immediate  PROCEDURE: Informed consent was obtained from the patient following explanation of the procedure, risks, benefits and alternatives. The patient understands, agrees and consents for the procedure. All questions were  addressed. A time out was performed.  Maximal barrier sterile technique utilized including caps, mask, sterile gowns, sterile gloves, large sterile drape, hand hygiene, and Betadine.  Previous imaging reviewed. Patient positioned right anterior oblique. Noncontrast localization CT performed. The posterior loculated right pleural effusion was localized. Under sterile conditions and local anesthesia, an 18 gauge introducer needle was advanced from a lateral intercostal approach into the fluid collection. Needle position confirmed with CT. Syringe aspiration yielded pleural fluid. Guidewire advanced followed by dilatation to insert a 10 [place] drain. Catheter position confirmed with CT. No immediate complication. Patient tolerated the procedure well. Catheter secured with a Prolene suture and connected to external pleura vac.  IMPRESSION: Successful CT-guided 10 French right chest drain insertion for a loculated pleural effusion.   Electronically Signed   By: Daryll Brod M.D.   On: 01/08/2014 15:27    Anti-infectives: Anti-infectives   Start     Dose/Rate Route Frequency Ordered Stop   01/03/14 1600  vancomycin (VANCOCIN) IVPB 1000 mg/200 mL premix  Status:  Discontinued     1,000 mg 200 mL/hr over 60 Minutes Intravenous Every 8 hours 01/03/14 0812 01/03/14 1010   01/01/14 0800  vancomycin (VANCOCIN) IVPB 1000 mg/200 mL premix  Status:  Discontinued     1,000 mg 200 mL/hr over 60 Minutes Intravenous Every 12 hours 12/31/13 2118 01/03/14 0812   01/01/14 0200  ceFEPIme (MAXIPIME) 1 g in dextrose 5 % 50 mL IVPB     1 g 100 mL/hr over 30 Minutes Intravenous Every 8 hours 12/31/13 2134     12/31/13 2200  aztreonam (AZACTAM) 2 g in dextrose 5 % 50 mL IVPB  Status:  Discontinued     2 g 100 mL/hr over 30 Minutes Intravenous 3 times per day 12/31/13 2053 12/31/13 2107   12/31/13 1830  vancomycin (VANCOCIN) 1,500 mg in sodium chloride 0.9 % 500 mL IVPB     1,500 mg 250 mL/hr over 120 Minutes Intravenous   Once 12/31/13 1821 01/03/14 1900   12/31/13 1830  piperacillin-tazobactam (ZOSYN) IVPB 3.375 g     3.375 g 100 mL/hr over 30 Minutes Intravenous  Once 12/31/13 1821 12/31/13 1914      Assessment/Plan: s/p rt chest tube placement 8/25 for loculated pleural eff; cont to monitor CXR/drain output; other plans as per CCM/IM  LOS: 9 days    ALLRED,D [facility] 01/09/2014

## 2014-01-09 NOTE — Progress Notes (Signed)
Heard loud noise from pts room went to assess, found phone on the floor with the screen cracked and the battery out. Asked pt if everything was ok pt said "he was fine he just wanted to be left alone and not talk."

## 2014-01-09 NOTE — Progress Notes (Signed)
Patient ID: Anthem Frazer, male   DOB: July 10, 1970, 43 y.o.   MRN: [id number]  [facility] PROGRESS NOTE  Henrique Parekh HYI:[id number] DOB: 1970-10-30 DOA: 12/31/2013 PCP: No PCP Per Patient  Brief narrative: 43 y/o male with a history of polysubstance abuse admitted on 8/17 with increasing dyspnea and a large R pleural effusion after a recent episode of RLL pneumonia. Pt with history of IVDA, EtOH, tobacco.    SIGNIFICANT EVENTS:  8/06 admitted for inability to walk chest pain, rhabdomyolysis  8/06 MRI Thoracic spine > no acute significant findings in thoracic spine, tiny t8-t9 disc protusion, RLL pneumonia  8/10 LHC > no CAD  8/11 discharged home on augmentin  8/18 Placement of R CT  8/19 50mg  TPA intrapleural  8/20 Improved CXR post placement of TPA  8/21 R thora > 750cc removed  8/23 R Chest tube removed   Assessment and Plan:   Acute hypoxic respiratory failure - secondary to HCAP  - Day 11 of Abx, currently on cefepime  - Rt pleural fluid AFB 8/21 >>  - Rt pleural fluid fungal cx 8/21 >>  - bronchial hygiene  Loculated Rt parapneumonic pleural effusion.  - F/u CXR 8/27  - appreciate PCCM input  Tobacco abuse.  - Smoking cessation discussed  - PRN BDs  Leukocytosis  - secondary to pleural effusion, HCAP - resolved  HTN, accelerated - add hydralazine    DVT prophylaxis  Heparin SQ while pt is in hospital  Code Status: Full Family Communication: Pt at bedside Disposition Plan: Transfer to telemetry   IV Access:   Peripheral IV Procedures and diagnostic studies:    8/06 MRI thoracic spine > no acute findings, tiny T8-9 disc protrusion w/o impingement, R lung airspace disease   8/18 CT chest > large R pleural effusion with compressive atelectasis of R lung, some left shift noted   8/21 CT chest > rt effusions, loculations +, RLL consolidation   8/24 MRI Lumbar > tiny annular fissure at L5-S1 on LEFT  Medical Consultants:   IR PCCM Other Consultants:    Physical therapy  Anti-Infectives:   Maxipime   Faye Ramsay, MD  [place] Pager [phone]  If 7PM-7AM, please contact night-coverage [email or web address] Password [email or web address] 01/09/2014, 6:04 PM   LOS: 9 days   HPI/Subjective: No events overnight.   Objective: Filed Vitals:   01/09/14 1200 01/09/14 1300 01/09/14 1400 01/09/14 1602  BP:    149/93  Pulse:    102  Temp: 98.2 F (36.8 C)   98.6 F (37 C)  TempSrc: Oral   Oral  Resp:  16 22   Height:      Weight:      SpO2: 97% 97% 97% 99%    Intake/Output Summary (Last 24 hours) at 01/09/14 1804 Last data filed at 01/09/14 1637  Gross per 24 hour  Intake   2875 ml  Output   4440 ml  Net  -1565 ml    Exam:   General:  Pt is alert, follows commands appropriately, not in acute distress  Cardiovascular: Regular rate and rhythm, S1/S2, no murmurs, no rubs, no gallops  Respiratory: R chest rube in place, clear breath sounds on the left side   Abdomen: Soft, non tender, non distended, bowel sounds present, no guarding  Data Reviewed: Basic Metabolic Panel:  Recent Labs Lab 01/04/14 1105 01/05/14 0335 01/06/14 0400 01/07/14 0340 01/09/14 0335  NA 138 138 140 139 138  K 3.5* 3.7 3.8 3.6* 3.8  CL 101 100 102  100 98  CO2 28 29 30 29 30   GLUCOSE 127* 130* 115* 154* 137*  BUN 6 4* 5* 5* 7  CREATININE 0.67 0.63 0.65 0.75 0.70  CALCIUM 8.4 8.2* 8.6 8.6 9.1   Liver Function Tests:  Recent Labs Lab 01/04/14 1105  PROT 5.5*   CBC:  Recent Labs Lab 01/03/14 0345 01/04/14 1105 01/06/14 0400 01/07/14 0340 01/09/14 0335  WBC 15.4* 10.4 12.2* 13.7* 10.5  NEUTROABS  --  8.6*  --   --   --   HGB 13.9 12.0* 11.3* 11.3* 11.4*  HCT 43.3 35.6* 34.2* 34.6* 35.4*  MCV 94.1 91.5 90.7 91.3 91.9  PLT 422* 433* 450* 411* 454*   Recent Results (from the past 240 hour(s))  CULTURE, BLOOD (ROUTINE X 2)     Status: None   Collection Time    12/31/13  6:30 PM      Result Value Ref Range Status   Specimen Description BLOOD  LEFT WRIST   Final   Special Requests BOTTLES DRAWN AEROBIC AND ANAEROBIC 6 ML   Final   Culture  Setup Time     Final   Value: 12/31/2013 22:37     Performed at [facility]   Culture     Final   Value: NO GROWTH 5 DAYS     Performed at [facility]   Report Status 01/06/2014 FINAL   Final  CULTURE, BLOOD (ROUTINE X 2)     Status: None   Collection Time    12/31/13  6:40 PM      Result Value Ref Range Status   Specimen Description BLOOD RIGHT HAND   Final   Special Requests BOTTLES DRAWN AEROBIC AND ANAEROBIC 4 ML   Final   Culture  Setup Time     Final   Value: 12/31/2013 22:38     Performed at [facility]   Culture     Final   Value: NO GROWTH 5 DAYS     Performed at [facility]   Report Status 01/06/2014 FINAL   Final  MRSA PCR SCREENING     Status: None   Collection Time    01/01/14  1:51 AM      Result Value Ref Range Status   MRSA by PCR NEGATIVE  NEGATIVE Final   Comment:            The GeneXpert MRSA Assay (FDA     approved for NASAL specimens     only), is one component of a     comprehensive MRSA colonization     surveillance program. It is not     intended to diagnose MRSA     infection nor to guide or     monitor treatment for     MRSA infections.  GRAM STAIN     Status: None   Collection Time    01/01/14 10:05 AM      Result Value Ref Range Status   Specimen Description PLEURAL   Final   Special Requests NONE   Final   Gram Stain     Final   Value: CYTOSPIN     WBC PRESENT,BOTH PMN AND MONONUCLEAR     NO ORGANISMS SEEN     Gram Stain Report Called to,Read Back By and Verified With: A. ARNOLD RN AT 1050 ON 08.18.15 BY SHUEA   Report Status 01/01/2014 FINAL   Final  BODY FLUID CULTURE     Status: None   Collection  Time    01/01/14 10:05 AM      Result Value Ref Range Status   Specimen Description PLEURAL   Final   Special Requests Normal   Final   Gram Stain     Final   Value: CYTOSPIN SLIDE WBC PRESENT,BOTH PMN AND  MONONUCLEAR     NO ORGANISMS SEEN     Gram Stain Report Called to,Read Back By and Verified With: Gram Stain Report Called to,Read Back By and Verified With: A ARNOLD RN 1050AM 01/01/14 BY SHUEA Performed at [facility]     Performed at [facility]   Culture     Final   Value: NO GROWTH 3 DAYS     Performed at [facility]   Report Status 01/04/2014 FINAL   Final  AFB CULTURE WITH SMEAR     Status: None   Collection Time    01/04/14  9:52 AM      Result Value Ref Range Status   Specimen Description PLEURAL   Final   Special Requests Normal   Final   Acid Fast Smear     Final   Value: NO ACID FAST BACILLI SEEN     Performed at [facility]   Culture     Final   Value: CULTURE WILL BE EXAMINED FOR 6 WEEKS BEFORE ISSUING A FINAL REPORT     Performed at [facility]   Report Status PENDING   Incomplete  BODY FLUID CULTURE     Status: None   Collection Time    01/04/14  9:52 AM      Result Value Ref Range Status   Specimen Description PLEURAL   Final   Special Requests Normal   Final   Gram Stain     Final   Value: RARE WBC PRESENT, PREDOMINANTLY PMN     NO ORGANISMS SEEN     Performed at [facility]   Culture     Final   Value: NO GROWTH 3 DAYS     Performed at [facility]   Report Status 01/07/2014 FINAL   Final  FUNGUS CULTURE W SMEAR     Status: None   Collection Time    01/04/14  9:52 AM      Result Value Ref Range Status   Specimen Description PLEURAL   Final   Special Requests NONE   Final   Fungal Smear     Final   Value: NO YEAST OR FUNGAL ELEMENTS SEEN     Performed at [facility]   Culture     Final   Value: CULTURE IN PROGRESS FOR FOUR WEEKS     Performed at [facility]   Report Status PENDING   Incomplete     Scheduled Meds: . ceFEPime (MAXIPIME) IV  1 g Intravenous Q8H  . chlordiazePOXIDE  10 mg Oral Q12H  . folic acid  1 mg Intravenous Daily  . gabapentin  300 mg Oral TID  .  guaiFENesin  1,200 mg Oral BID  . heparin subcutaneous  5,000 Units Subcutaneous 3 times per day  . lidocaine  2 patch Transdermal Q24H  . metoprolol tartrate  50 mg Oral BID  . naproxen  375 mg Oral TID WC  . nicotine  14 mg Transdermal Daily  . pantoprazole  40 mg Oral Daily  . polyethylene glycol  17 g Oral Daily  . saccharomyces boulardii  250 mg Oral BID  . thiamine  100 mg Intravenous Daily   Continuous Infusions: . sodium chloride 50 mL/hr at 01/09/14 1618

## 2014-01-09 NOTE — Progress Notes (Signed)
CARE MANAGEMENT NOTE 01/09/2014  Patient:  Ralph Chavez, Ralph Chavez   Account Number:  [id number]  Date Initiated:  01/01/2014  Documentation initiated by:  Allexis Bordenave  Subjective/Objective Assessment:   pt with hx of polysubstance abuse/presented with increased wob films reveals a large pl.effusion with compression of the rt lung and shirting of the cardiac structures to the right     Action/Plan:   home when stable/pt did not follow up with plans after last and most recent admission/from previous admissionPROVIDED W/OCMMUNITY [place]   Anticipated DC Date:  01/10/2014   Anticipated DC Plan:  HOME/SELF CARE  In-house referral  Clinical Social Worker  Development worker, community      DC Planning Services  CM consult      [facility] Choice  NA   Choice offered to / List presented to:  NA      DME agency  NA     [place] arranged  NA      [place] agency  NA   Status of service:  In process, will continue to follow Medicare Important Message given?  NA - LOS <3 / Initial given by admissions (If response is "NO", the following Medicare IM given date fields will be blank) Date Medicare IM given:   Medicare IM given by:   Date Additional Medicare IM given:   Additional Medicare IM given by:    Discharge Disposition:    Per UR Regulation:  Reviewed for med. necessity/level of care/duration of stay  If discussed at [place] of Stay Meetings, dates discussed:   01/08/2014    Comments:  [id number] [name],RN,BSn,CCM: has right chest tube in to suction/had 775 cc of bld tinged drainage since insertion/cxr of [id number] shows:There is evidence of congestive heart failure with edema and cardiomegaly. There is a small residual effusion on the right. No pneumothorax apparent/wbc is wnl today/others are improving/patient is very hostile and does not wish to discuss his future at this point.  [id number]  [name],RN,BSN,CCM: Patient with hx of polysubstance abuse /abnormal ecg/ rt to left cardiac shift on admission/ chest tube removed on [id number]/IR guided thoracentesis to remove residual loculations/ct guided drain to be placed on [id number]/  01/04/14 KATHY MAHABIR RN,BSN NCM [phone] THORACENTESIS.  [id number] to have chest tube insertion due to extremely large plueral effusion and compression of the rt lung and left shifting of the cardiac structures. Beyonca Wisz,RN,BSN,CCM

## 2014-01-09 NOTE — Progress Notes (Signed)
Walked into patient's room during report and found him lying face down on the floor. Patient is a high fall risk and last time RN had been in patient's room, had made sure that patient's chair alarm was in place and working before leaving. Per patient, someone took him to the bathroom and did not wait with him to ensure he returned to chair or bed. Per pt, after he used the bathroom, he decided to lay down on the floor. Pt stated, "I really didn't fall. I wanted to stretch out all the way and the floor is the only place I can do that." Pt's significant other stated, "He really didn't fall." Several RNs and NTs assisted patient back into the chair and made sure the chair alarm was in place and working properly. Patient educated that he is a high fall risk and he is not to get up by himself. Charge nurse, Lambert Keto, made aware of situation. Safety portal to be submitted shortly.

## 2014-01-10 ENCOUNTER — Inpatient Hospital Stay (HOSPITAL_COMMUNITY): Payer: Self-pay

## 2014-01-10 LAB — BASIC METABOLIC PANEL
Anion gap: 12 (ref 5–15)
BUN: 7 mg/dL (ref 6–23)
CALCIUM: 9.5 mg/dL (ref 8.4–10.5)
CO2: 28 meq/L (ref 19–32)
CREATININE: 0.69 mg/dL (ref 0.50–1.35)
Chloride: 99 mEq/L (ref 96–112)
GFR calc Af Amer: >90 mL/min (ref >90)
GFR calc non Af Amer: >90 mL/min (ref >90)
GLUCOSE: 118 mg/dL — AB (ref 70–99)
Potassium: 3.9 mEq/L (ref 3.7–5.3)
Sodium: 139 mEq/L (ref 137–147)

## 2014-01-10 LAB — CBC
HCT: 37.5 % — ABNORMAL LOW (ref 39.0–52.0)
HEMOGLOBIN: 12.2 g/dL — AB (ref 13.0–17.0)
MCH: 29.8 pg (ref 26.0–34.0)
MCHC: 32.5 g/dL (ref 30.0–36.0)
MCV: 91.7 fL (ref 78.0–100.0)
Platelets: 524 10*3/uL — ABNORMAL HIGH (ref 150–400)
RBC: 4.09 MIL/uL — AB (ref 4.22–5.81)
RDW: 14.7 % (ref 11.5–15.5)
WBC: 10.5 10*3/uL (ref 4.0–10.5)

## 2014-01-10 MED ORDER — PANTOPRAZOLE SODIUM 40 MG IV SOLR
40.0000 mg | Freq: Two times a day (BID) | INTRAVENOUS | Status: DC
  Administered 2014-01-10 – 2014-01-11 (×2): 40 mg via INTRAVENOUS
  Filled 2014-01-10 (×5): qty 40

## 2014-01-10 MED ORDER — NICOTINE 21 MG/24HR TD PT24
21.0000 mg | MEDICATED_PATCH | Freq: Every day | TRANSDERMAL | Status: DC
  Administered 2014-01-10 – 2014-01-11 (×2): 21 mg via TRANSDERMAL
  Filled 2014-01-10 (×3): qty 1

## 2014-01-10 NOTE — Progress Notes (Signed)
Patient ID: Ralph Chavez, male   DOB: 02/16/1971, 43 y.o.   MRN: [id number]  [facility] PROGRESS NOTE  Ralph Chavez VOZ:[id number] DOB: 1971-03-12 DOA: 12/31/2013 PCP: No PCP Per Patient  Brief narrative:  43 y/o male with a history of polysubstance abuse admitted on 8/17 with increasing dyspnea and a large R pleural effusion after a recent episode of RLL pneumonia. Pt with history of IVDA, EtOH, tobacco.   SIGNIFICANT EVENTS:  8/06 admitted for inability to walk chest pain, rhabdomyolysis  8/06 MRI Thoracic spine > no acute significant findings in thoracic spine, tiny t8-t9 disc protusion, RLL pneumonia  8/10 LHC > no CAD  8/11 discharged home on augmentin  8/18 Placement of R CT  8/19 50mg  TPA intrapleural  8/20 Improved CXR post placement of TPA  8/21 R thora > 750cc removed  8/23 R Chest tube removed  8/25 IR placed Rt chest tube, -20 cm suction  8/26 Change chest tube to -10 cm suction  8/27 Change chest tube to water seal  Assessment and Plan:   Acute hypoxic respiratory failure  - secondary to HCAP  - Day 12 of Abx, currently on cefepime  - Rt pleural fluid AFB 8/21 >>  - Rt pleural fluid fungal cx 8/21 >>  - bronchial hygiene  Loculated Rt parapneumonic pleural effusion.  - F/u CXR 8/27  - appreciate PCCM input  Tobacco abuse.  - Smoking cessation discussed  - PRN BDs  Leukocytosis  - secondary to pleural effusion, HCAP  - resolved  HTN, accelerated  - added hydralazine, SBP in 140's   DVT prophylaxis  Heparin SQ while pt is in hospital  Code Status: Full  Family Communication: Pt at bedside  Disposition Plan: Remains inpatient   IV Access:   Peripheral IV Procedures and diagnostic studies:   8/06 MRI thoracic spine > no acute findings, tiny T8-9 disc protrusion w/o impingement, R lung airspace disease  8/18 CT chest > large R pleural effusion with compressive atelectasis of R lung, some left shift noted  8/21 CT chest > rt effusions, loculations  +, RLL consolidation  8/24 MRI Lumbar > tiny annular fissure at L5-S1 on LEFT   Medical Consultants:   IR  PCCM Other Consultants:   Physical therapy  Anti-Infectives:   Maxipime   Ralph Ramsay, MD  [place] Pager [phone]  If 7PM-7AM, please contact night-coverage [email or web address] Password [facility] 01/10/2014, 5:44 PM   LOS: 10 days   HPI/Subjective: No events overnight.   Objective: Filed Vitals:   01/10/14 0439 01/10/14 0507 01/10/14 0918 01/10/14 1444  BP: 159/88  140/85 145/91  Pulse: 109   111  Temp: 98.6 F (37 C)   98.6 F (37 C)  TempSrc: Oral   Oral  Resp:    20  Height:      Weight:  95.3 kg (210 lb 1.6 oz)    SpO2: 99%   96%    Intake/Output Summary (Last 24 hours) at 01/10/14 1744 Last data filed at 01/10/14 1241  Gross per 24 hour  Intake   1610 ml  Output    400 ml  Net   1210 ml    Exam:   General:  Pt is alert, follows commands appropriately, not in acute distress  Cardiovascular: Regular rhythm, tachycardic, no rubs, no gallops  Respiratory: Right chest tube in place  Abdomen: Soft, non tender, non distended, bowel sounds present, no guarding  Data Reviewed: Basic Metabolic Panel:  Recent Labs Lab 01/05/14 0335 01/06/14 0400  01/07/14 0340 01/09/14 0335 01/10/14 0012  NA 138 140 139 138 139  K 3.7 3.8 3.6* 3.8 3.9  CL 100 102 100 98 99  CO2 29 30 29 30 28   GLUCOSE 130* 115* 154* 137* 118*  BUN 4* 5* 5* 7 7  CREATININE 0.63 0.65 0.75 0.70 0.69  CALCIUM 8.2* 8.6 8.6 9.1 9.5   Liver Function Tests:  Recent Labs Lab 01/04/14 1105  PROT 5.5*   CBC:  Recent Labs Lab 01/04/14 1105 01/06/14 0400 01/07/14 0340 01/09/14 0335 01/10/14 0012  WBC 10.4 12.2* 13.7* 10.5 10.5  NEUTROABS 8.6*  --   --   --   --   HGB 12.0* 11.3* 11.3* 11.4* 12.2*  HCT 35.6* 34.2* 34.6* 35.4* 37.5*  MCV 91.5 90.7 91.3 91.9 91.7  PLT 433* 450* 411* 454* 524*     Recent Results (from the past 240 hour(s))  CULTURE, BLOOD (ROUTINE X 2)      Status: None   Collection Time    12/31/13  6:30 PM      Result Value Ref Range Status   Specimen Description BLOOD LEFT WRIST   Final   Special Requests BOTTLES DRAWN AEROBIC AND ANAEROBIC 6 ML   Final   Culture  Setup Time     Final   Value: 12/31/2013 22:37     Performed at [facility]   Culture     Final   Value: NO GROWTH 5 DAYS     Performed at [facility]   Report Status 01/06/2014 FINAL   Final  CULTURE, BLOOD (ROUTINE X 2)     Status: None   Collection Time    12/31/13  6:40 PM      Result Value Ref Range Status   Specimen Description BLOOD RIGHT HAND   Final   Special Requests BOTTLES DRAWN AEROBIC AND ANAEROBIC 4 ML   Final   Culture  Setup Time     Final   Value: 12/31/2013 22:38     Performed at [facility]   Culture     Final   Value: NO GROWTH 5 DAYS     Performed at [facility]   Report Status 01/06/2014 FINAL   Final  MRSA PCR SCREENING     Status: None   Collection Time    01/01/14  1:51 AM      Result Value Ref Range Status   MRSA by PCR NEGATIVE  NEGATIVE Final   Comment:            The GeneXpert MRSA Assay (FDA     approved for NASAL specimens     only), is one component of a     comprehensive MRSA colonization     surveillance program. It is not     intended to diagnose MRSA     infection nor to guide or     monitor treatment for     MRSA infections.  GRAM STAIN     Status: None   Collection Time    01/01/14 10:05 AM      Result Value Ref Range Status   Specimen Description PLEURAL   Final   Special Requests NONE   Final   Gram Stain     Final   Value: CYTOSPIN     WBC PRESENT,BOTH PMN AND MONONUCLEAR     NO ORGANISMS SEEN     Gram Stain Report Called to,Read Back By and Verified With: A. ARNOLD RN AT  1050 ON 08.18.15 BY SHUEA   Report Status 01/01/2014 FINAL   Final  BODY FLUID CULTURE     Status: None   Collection Time    01/01/14 10:05 AM      Result Value Ref Range Status   Specimen Description  PLEURAL   Final   Special Requests Normal   Final   Gram Stain     Final   Value: CYTOSPIN SLIDE WBC PRESENT,BOTH PMN AND MONONUCLEAR     NO ORGANISMS SEEN     Gram Stain Report Called to,Read Back By and Verified With: Gram Stain Report Called to,Read Back By and Verified With: A ARNOLD RN 1050AM 01/01/14 BY SHUEA Performed at [facility]     Performed at [facility]   Culture     Final   Value: NO GROWTH 3 DAYS     Performed at [facility]   Report Status 01/04/2014 FINAL   Final  AFB CULTURE WITH SMEAR     Status: None   Collection Time    01/04/14  9:52 AM      Result Value Ref Range Status   Specimen Description PLEURAL   Final   Special Requests Normal   Final   Acid Fast Smear     Final   Value: NO ACID FAST BACILLI SEEN     Performed at [facility]   Culture     Final   Value: CULTURE WILL BE EXAMINED FOR 6 WEEKS BEFORE ISSUING A FINAL REPORT     Performed at [facility]   Report Status PENDING   Incomplete  BODY FLUID CULTURE     Status: None   Collection Time    01/04/14  9:52 AM      Result Value Ref Range Status   Specimen Description PLEURAL   Final   Special Requests Normal   Final   Gram Stain     Final   Value: RARE WBC PRESENT, PREDOMINANTLY PMN     NO ORGANISMS SEEN     Performed at [facility]   Culture     Final   Value: NO GROWTH 3 DAYS     Performed at [facility]   Report Status 01/07/2014 FINAL   Final  FUNGUS CULTURE W SMEAR     Status: None   Collection Time    01/04/14  9:52 AM      Result Value Ref Range Status   Specimen Description PLEURAL   Final   Special Requests NONE   Final   Fungal Smear     Final   Value: NO YEAST OR FUNGAL ELEMENTS SEEN     Performed at [facility]   Culture     Final   Value: CULTURE IN PROGRESS FOR FOUR WEEKS     Performed at [facility]   Report Status PENDING   Incomplete     Scheduled Meds: . antiseptic oral rinse  7 mL Mouth  Rinse BID  . ceFEPime (MAXIPIME) IV  1 g Intravenous Q8H  . chlordiazePOXIDE  10 mg Oral Q12H  . folic acid  1 mg Intravenous Daily  . gabapentin  300 mg Oral TID  . guaiFENesin  1,200 mg Oral BID  . heparin subcutaneous  5,000 Units Subcutaneous 3 times per day  . hydrALAZINE  10 mg Oral 3 times per day  . lidocaine  2 patch Transdermal Q24H  . metoprolol tartrate  50 mg  Oral BID  . naproxen  375 mg Oral TID WC  . nicotine  21 mg Transdermal Daily  . pantoprazole (PROTONIX) IV  40 mg Intravenous Q12H  . pneumococcal 23 valent vaccine  0.5 mL Intramuscular Tomorrow-1000  . polyethylene glycol  17 g Oral Daily  . saccharomyces boulardii  250 mg Oral BID  . thiamine  100 mg Intravenous Daily   Continuous Infusions:

## 2014-01-10 NOTE — Progress Notes (Signed)
Physical Therapy Treatment Patient Details Name: Ralph Chavez MRN: [id number] DOB: [birth year] Today's Date: 01/10/2014    History of Present Illness 43 yo male admitted 12/31/13 with SOB, found to have  Pleural effusion, Chest tube removed 01/06/14. Recently in hospital for fall, rhabdo. Hx substance abuse. Pigtail cathether placement scheduled for 8/25    PT Comments    Pt in bed with sitter in room.  Assisted OOB to BR then amb in hallway required increased time due to very slow gait.  Very unsteady/drunken gait.  Lateral LOB x 1 as pt demon slow/delayed corrective reaction.  HIGH FALL RISK.  Had pt use RW for increased safety/support.  Assisted to BR again before returning back to bed.    Follow Up Recommendations  Outpatient PT (balance program)     Equipment Recommendations  Rolling walker with 5" wheels    Recommendations for Other Services       Precautions / Restrictions Precautions Precautions: Fall Precaution Comments: chest tube Restrictions Weight Bearing Restrictions: No    Mobility  Bed Mobility Overal bed mobility: Modified Independent             General bed mobility comments: increased time  Transfers Overall transfer level: Needs assistance Equipment used: Rolling walker (2 wheeled) Transfers: Sit to/from Stand Sit to Stand: Min guard;Min assist         General transfer comment: unsteady reaching for furniture or whatever for support so had pt use RW  Ambulation/Gait Ambulation/Gait assistance: Min guard;Min assist Ambulation Distance (Feet): 800 Feet Assistive device: Rolling walker (2 wheeled) Gait Pattern/deviations: Step-through pattern;Staggering right;Staggering left;Trunk flexed Gait velocity: decreased   General Gait Details: Very unsteady/drunken gait with slow/delayed corrective reaction.  HIGH FALL RISK.  VC's for safety esp with turns and proper walker to self distance with upright posture.   Stairs             Wheelchair Mobility    Modified Rankin (Stroke Patients Only)       Balance                                    Cognition                            Exercises      General Comments        Pertinent Vitals/Pain      Home Living                      Prior Function            PT Goals (current goals can now be found in the care plan section) Progress towards PT goals: Progressing toward goals    Frequency  Min 3X/week    PT Plan      Co-evaluation             End of Session Equipment Utilized During Treatment: Gait belt Activity Tolerance: Patient limited by fatigue;Patient limited by pain Patient left: in bed;with call bell/phone within reach;with nursing/sitter in room     Time: 1405-1445 PT Time Calculation (min): 40 min  Charges:  $Gait Training: 23-37 mins $Therapeutic Activity: 8-22 mins                    G Codes:      Rica Koyanagi  PTA [facility]  Acute  Rehab Pager      [phone]

## 2014-01-10 NOTE — Progress Notes (Signed)
PULMONARY / CRITICAL CARE MEDICINE   Name: Ralph Chavez MRN: [id number] DOB: 09/09/1970    ADMISSION DATE:  12/31/2013 CONSULTATION DATE:  01/01/2014  REFERRING MD :  Dyann Kief [facility]  CHIEF COMPLAINT:  Shortness of breath, pain all over  INITIAL PRESENTATION: 43 y/o male with a history of polysubstance abuse admitted on 8/17 with increasing dyspnea and a large R pleural effusion after a recent episode of RLL pneumonia.  STUDIES:  8/06 MRI thoracic spine > no acute findings, tiny T8-9 disc protrusion w/o impingement, R lung airspace disease 8/06 MRI Thoracic spine > no acute significant findings in thoracic spine, tiny t8-t9 disc protusion, RLL pneumonia 8/10 LHC > no CAD 8/18 CT chest > large R pleural effusion with compressive atelectasis of R lung, some left shift noted 8/21 CT chest > rt effusions, loculations +, RLL consolidation 8/21 R thora > 750cc removed 8/24 MRI Lumbar > tiny annular fissure at L5-S1 on LEFT  SIGNIFICANT EVENTS: 8/06  admitted for inability to walk chest pain, rhabdomyolysis 8/11  discharged home on augmentin 8/18  Placement of R CT 8/19  50mg  TPA intrapleural 8/20  Improved CXR post placement of TPA  8/23  R Chest tube removed 8/25  IR placed Rt chest tube, -20 cm suction 8/26  Change chest tube to -10 cm suction 8/27  Change chest tube to water seal  SUBJECTIVE:   Breathing better.  Not much cough.  Still feels weak, especially in Lt leg.  Neuropathic pain improved with change in dose of neurontin.  90 ml of fluid out from Rt chest tube over past 24 hrs >> RN reports chest tube has been on water seal overnight.  VITAL SIGNS: Temp:  [98.2 F (36.8 C)-98.6 F (37 C)] 98.6 F (37 C) (08/27 0439) Pulse Rate:  [102-124] 109 (08/27 0439) Resp:  [16-22] 22 (08/26 2209) BP: (149-167)/(88-120) 159/88 mmHg (08/27 0439) SpO2:  [95 %-99 %] 99 % (08/27 0439) Weight:  [210 lb 1.6 oz (95.3 kg)] 210 lb 1.6 oz (95.3 kg) (08/27 0507)  INTAKE /  OUTPUT: Intake/Output     08/26 0701 - 08/27 0700 08/27 0701 - 08/28 0700   P.O. 1720    I.V. (mL/kg) 575 (6)    IV Piggyback 100    Total Intake(mL/kg) 2395 (25.1)    Urine (mL/kg/hr) 2150 (0.9)    Chest Tube 60 (0)    Total Output 2210     Net +185          Urine Occurrence 8 x    Stool Occurrence 4 x    Emesis Occurrence  3 x     PHYSICAL EXAMINATION: Gen: no distress HEENT: no sinus tenderness PULM: decreased breath sounds, Rt chest tube in place CV: regular, tachycardic AB: soft, non tender Ext: no edema Neuro: alert, improved strength Lt leg  LABS:  CBC  Recent Labs Lab 01/07/14 0340 01/09/14 0335 01/10/14 0012  WBC 13.7* 10.5 10.5  HGB 11.3* 11.4* 12.2*  HCT 34.6* 35.4* 37.5*  PLT 411* 454* 524*   BMET  Recent Labs Lab 01/07/14 0340 01/09/14 0335 01/10/14 0012  NA 139 138 139  K 3.6* 3.8 3.9  CL 100 98 99  CO2 29 30 28   BUN 5* 7 7  CREATININE 0.75 0.70 0.69  GLUCOSE 154* 137* 118*   Electrolytes  Recent Labs Lab 01/07/14 0340 01/09/14 0335 01/10/14 0012  CALCIUM 8.6 9.1 9.5   Imaging Dg Chest Port 1 View  01/09/2014   CLINICAL DATA:  Pleural effusion  EXAM: PORTABLE CHEST - 1 VIEW  COMPARISON:  January 06, 2014  FINDINGS: There is a chest tube on the right with significant interval resolution of pleural effusion. A small pleural effusion remains on the right. There is moderate generalized interstitial edema with cardiomegaly. No airspace consolidation. No bony lesions. No apparent pneumothorax.  IMPRESSION: Chest tube at right base. There is evidence of congestive heart failure with edema and cardiomegaly. There is a small residual effusion on the right. No pneumothorax apparent   Electronically Signed   By: Lowella Grip M.D.   On: 01/09/2014 07:00     Impression: 43 yo male smoker with HCAP and Rt parapneumonic pleural effusion.  Hx of IVDA, ETOH.  HCAP. Plan: Day 11 of Abx, currently on cefepime >> likely can transition to oral  abx soon >> defer to primary team  Rt pleural fluid AFB 8/21 >> Rt pleural fluid fungal cx 8/21 >>   Loculated Rt parapneumonic pleural effusion. Plan: Change chest tube to water seal 8/27 F/u CXR 8/28 Consider d/c chest tube in next 24 to 48 hours  Acute hypoxic respiratory failure 2nd to HCAP >> resolved. Plan: Monitor oxygenation Bronchial hygiene  Tobacco abuse. Plan: Smoking cessation PRN BDs  Chesley Mires, MD [facility] Pulmonary/Critical Care 01/10/2014, 8:38 AM Pager:  [phone] After 3pm call: [phone]

## 2014-01-10 NOTE — Progress Notes (Signed)
Subjective: Pt states his breathing has improved; main c/o now is LLE pain, paresthesias, difficulty extending left leg from sitting position  Objective: Vital signs in last 24 hours: Temp:  [98.2 F (36.8 C)-98.6 F (37 C)] 98.6 F (37 C) (08/27 0439) Pulse Rate:  [102-124] 109 (08/27 0439) Resp:  [16-22] 22 (08/26 2209) BP: (140-167)/(85-120) 140/85 mmHg (08/27 0918) SpO2:  [97 %-99 %] 99 % (08/27 0439) Weight:  [210 lb 1.6 oz (95.3 kg)] 210 lb 1.6 oz (95.3 kg) (08/27 0507) Last BM Date: 01/08/14  Intake/Output from previous day: 08/26 0701 - 08/27 0700 In: 2395 [P.O.:1720; I.V.:575; IV Piggyback:100] Out: 2210 [Urine:2150; Chest Tube:60] Intake/Output this shift: Total I/O In: 120 [P.O.:120] Out: -   Rt chest tube intact, no air leak; output 60 cc's blood-tinged fluid; cx's neg to date, cyt neg; currently to water seal; ext without edema, has hyperesthesia left upper thigh region  Lab Results:   Recent Labs  01/09/14 0335 01/10/14 0012  WBC 10.5 10.5  HGB 11.4* 12.2*  HCT 35.4* 37.5*  PLT 454* 524*   BMET  Recent Labs  01/09/14 0335 01/10/14 0012  NA 138 139  K 3.8 3.9  CL 98 99  CO2 30 28  GLUCOSE 137* 118*  BUN 7 7  CREATININE 0.70 0.69  CALCIUM 9.1 9.5   PT/INR  Recent Labs  01/08/14 0335  LABPROT 13.5  INR 1.03   ABG No results found for this basename: PHART, PCO2, PO2, HCO3,  in the last 72 hours  Studies/Results: Dg Chest Port 1 View  01/10/2014   CLINICAL DATA:  Pneumonia. Right pleural effusion. Right chest tube.  EXAM: PORTABLE CHEST - 1 VIEW  COMPARISON:  01/09/2014.  FINDINGS: Right chest tube in stable position. Mediastinum and hilar structures are stable. Stable cardiomegaly with pulmonary venous congestion and bilateral interstitial prominence consistent with congestive heart failure. Stable small right pleural effusion. No pneumothorax. No acute osseus abnormality.  IMPRESSION: 1. Right chest tube in stable position. Stable small  right pleural effusion. 2. Stable changes of congestive heart failure and pulmonary interstitial edema.   Electronically Signed   By: Marcello Moores  Register   On: 01/10/2014 07:46   Dg Chest Port 1 View  01/09/2014   CLINICAL DATA:  Pleural effusion  EXAM: PORTABLE CHEST - 1 VIEW  COMPARISON:  January 06, 2014  FINDINGS: There is a chest tube on the right with significant interval resolution of pleural effusion. A small pleural effusion remains on the right. There is moderate generalized interstitial edema with cardiomegaly. No airspace consolidation. No bony lesions. No apparent pneumothorax.  IMPRESSION: Chest tube at right base. There is evidence of congestive heart failure with edema and cardiomegaly. There is a small residual effusion on the right. No pneumothorax apparent   Electronically Signed   By: Lowella Grip M.D.   On: 01/09/2014 07:00   Ct Image Guided Fluid Drain By Catheter  01/08/2014   CLINICAL DATA:  Loculated right pleural effusion  EXAM: CT-GUIDED RIGHT POSTERIOR 10 FRENCH CHEST TUBE INSERTION  Date:  8/25/20158/25/2015 2:05 pm  Radiologist:  M. Daryll Brod, MD  Guidance:  CT  FLUOROSCOPY TIME:  None.  MEDICATIONS AND MEDICAL HISTORY: 2 mg Versed, 50 mcg fentanyl  ANESTHESIA/SEDATION: 15 min  CONTRAST:  None.  COMPLICATIONS: No immediate  PROCEDURE: Informed consent was obtained from the patient following explanation of the procedure, risks, benefits and alternatives. The patient understands, agrees and consents for the procedure. All questions were addressed. A time out  was performed.  Maximal barrier sterile technique utilized including caps, mask, sterile gowns, sterile gloves, large sterile drape, hand hygiene, and Betadine.  Previous imaging reviewed. Patient positioned right anterior oblique. Noncontrast localization CT performed. The posterior loculated right pleural effusion was localized. Under sterile conditions and local anesthesia, an 18 gauge introducer needle was advanced from  a lateral intercostal approach into the fluid collection. Needle position confirmed with CT. Syringe aspiration yielded pleural fluid. Guidewire advanced followed by dilatation to insert a 10 [place] drain. Catheter position confirmed with CT. No immediate complication. Patient tolerated the procedure well. Catheter secured with a Prolene suture and connected to external pleura vac.  IMPRESSION: Successful CT-guided 10 French right chest drain insertion for a loculated pleural effusion.   Electronically Signed   By: Daryll Brod M.D.   On: 01/08/2014 15:27    Anti-infectives: Anti-infectives   Start     Dose/Rate Route Frequency Ordered Stop   01/03/14 1600  vancomycin (VANCOCIN) IVPB 1000 mg/200 mL premix  Status:  Discontinued     1,000 mg 200 mL/hr over 60 Minutes Intravenous Every 8 hours 01/03/14 0812 01/03/14 1010   01/01/14 0800  vancomycin (VANCOCIN) IVPB 1000 mg/200 mL premix  Status:  Discontinued     1,000 mg 200 mL/hr over 60 Minutes Intravenous Every 12 hours 12/31/13 2118 01/03/14 0812   01/01/14 0200  ceFEPIme (MAXIPIME) 1 g in dextrose 5 % 50 mL IVPB     1 g 100 mL/hr over 30 Minutes Intravenous Every 8 hours 12/31/13 2134     12/31/13 2200  aztreonam (AZACTAM) 2 g in dextrose 5 % 50 mL IVPB  Status:  Discontinued     2 g 100 mL/hr over 30 Minutes Intravenous 3 times per day 12/31/13 2053 12/31/13 2107   12/31/13 1830  vancomycin (VANCOCIN) 1,500 mg in sodium chloride 0.9 % 500 mL IVPB     1,500 mg 250 mL/hr over 120 Minutes Intravenous  Once 12/31/13 1821 01/03/14 1900   12/31/13 1830  piperacillin-tazobactam (ZOSYN) IVPB 3.375 g     3.375 g 100 mL/hr over 30 Minutes Intravenous  Once 12/31/13 1821 12/31/13 1914      Assessment/Plan: S/p rt chest tube placement 8/25 for loculated pleural eff; per CCM may consider tube removal in next 24-48 hrs depending upon CXR/output; other plans as per IM    LOS: 10 days    ALLRED,D [facility] 01/10/2014

## 2014-01-11 ENCOUNTER — Inpatient Hospital Stay (HOSPITAL_COMMUNITY): Payer: Self-pay

## 2014-01-11 LAB — COMPREHENSIVE METABOLIC PANEL
ALT: 19 U/L (ref 0–53)
AST: 19 U/L (ref 0–37)
Albumin: 2.4 g/dL — ABNORMAL LOW (ref 3.5–5.2)
Alkaline Phosphatase: 103 U/L (ref 39–117)
Anion gap: 13 (ref 5–15)
BUN: 8 mg/dL (ref 6–23)
CALCIUM: 9.5 mg/dL (ref 8.4–10.5)
CHLORIDE: 97 meq/L (ref 96–112)
CO2: 26 meq/L (ref 19–32)
CREATININE: 0.8 mg/dL (ref 0.50–1.35)
GFR calc Af Amer: >90 mL/min (ref >90)
GLUCOSE: 114 mg/dL — AB (ref 70–99)
Potassium: 4.1 mEq/L (ref 3.7–5.3)
Sodium: 136 mEq/L — ABNORMAL LOW (ref 137–147)
Total Protein: 8 g/dL (ref 6.0–8.3)

## 2014-01-11 LAB — CBC
HCT: 36.7 % — ABNORMAL LOW (ref 39.0–52.0)
Hemoglobin: 12.1 g/dL — ABNORMAL LOW (ref 13.0–17.0)
MCH: 30 pg (ref 26.0–34.0)
MCHC: 33 g/dL (ref 30.0–36.0)
MCV: 91.1 fL (ref 78.0–100.0)
PLATELETS: 636 10*3/uL — AB (ref 150–400)
RBC: 4.03 MIL/uL — ABNORMAL LOW (ref 4.22–5.81)
RDW: 14.8 % (ref 11.5–15.5)
WBC: 13.9 10*3/uL — AB (ref 4.0–10.5)

## 2014-01-11 MED ORDER — VITAMIN B-1 100 MG PO TABS
100.0000 mg | ORAL_TABLET | Freq: Every day | ORAL | Status: DC
  Filled 2014-01-11: qty 1

## 2014-01-11 MED ORDER — LEVOFLOXACIN 500 MG PO TABS
500.0000 mg | ORAL_TABLET | Freq: Every day | ORAL | Status: DC
  Administered 2014-01-11: 500 mg via ORAL
  Filled 2014-01-11: qty 1

## 2014-01-11 MED ORDER — LEVOFLOXACIN 750 MG PO TABS
750.0000 mg | ORAL_TABLET | Freq: Every day | ORAL | Status: DC
  Filled 2014-01-11: qty 1

## 2014-01-11 MED ORDER — HYDRALAZINE HCL 20 MG/ML IJ SOLN
5.0000 mg | Freq: Once | INTRAMUSCULAR | Status: AC
  Administered 2014-01-11: 5 mg via INTRAVENOUS
  Filled 2014-01-11: qty 1

## 2014-01-11 MED ORDER — ALPRAZOLAM 0.5 MG PO TABS
0.5000 mg | ORAL_TABLET | Freq: Once | ORAL | Status: AC
  Administered 2014-01-11: 0.5 mg via ORAL
  Filled 2014-01-11: qty 1

## 2014-01-11 MED ORDER — FOLIC ACID 1 MG PO TABS
1.0000 mg | ORAL_TABLET | Freq: Every day | ORAL | Status: DC
  Filled 2014-01-11: qty 1

## 2014-01-11 NOTE — Progress Notes (Signed)
ANTIBIOTIC CONSULT NOTE - FOLLOW UP  Pharmacy Consult for renally adjustment of antibiotics Indication: HCAP, recurrent pneumonia  No Known Allergies  Patient Measurements: Height: 5\' 10"  (177.8 cm) Weight: 210 lb 1.6 oz (95.3 kg) IBW/kg (Calculated) : 73  Vital Signs: Temp: 98.2 F (36.8 C) (08/28 0500) Temp src: Oral (08/28 0500) BP: 150/84 mmHg (08/28 0600) Pulse Rate: 96 (08/28 0500) Intake/Output from previous day: 08/27 0701 - 08/28 0700 In: 1110 [P.O.:840; IV Piggyback:150] Out: 25 [Chest Tube:25] Intake/Output from this shift: Total I/O In: 240 [P.O.:240] Out: -   Labs:  Recent Labs  01/09/14 0335 01/10/14 0012 01/11/14 0020 01/11/14 0030  WBC 10.5 10.5  --  13.9*  HGB 11.4* 12.2*  --  12.1*  PLT 454* 524*  --  636*  CREATININE 0.70 0.69 0.80  --    Estimated Creatinine Clearance: 140.8 ml/min (by C-G formula based on Cr of 0.8). No results found for this basename: VANCOTROUGH, Corlis Leak, VANCORANDOM, GENTTROUGH, GENTPEAK, GENTRANDOM, TOBRATROUGH, TOBRAPEAK, TOBRARND, AMIKACINPEAK, AMIKACINTROU, AMIKACIN,  in the last 72 hours   Microbiology: Recent Results (from the past 720 hour(s))  MRSA PCR SCREENING     Status: None   Collection Time    12/20/13  7:56 AM      Result Value Ref Range Status   MRSA by PCR NEGATIVE  NEGATIVE Final   Comment:            The GeneXpert MRSA Assay (FDA     approved for NASAL specimens     only), is one component of a     comprehensive MRSA colonization     surveillance program. It is not     intended to diagnose MRSA     infection nor to guide or     monitor treatment for     MRSA infections.  CULTURE, EXPECTORATED SPUTUM-ASSESSMENT     Status: None   Collection Time    12/21/13  9:15 AM      Result Value Ref Range Status   Specimen Description SPUTUM   Final   Special Requests NONE   Final   Sputum evaluation     Final   Value: MICROSCOPIC FINDINGS SUGGEST THAT THIS SPECIMEN IS NOT REPRESENTATIVE OF LOWER  RESPIRATORY SECRETIONS. PLEASE RECOLLECT.     Gram Stain Report Called to,Read Back By and Verified With: Country Walk, M AT 0942 ON 12/21/13 BY HOBBINS, J.   Report Status 12/21/2013 FINAL   Final  CULTURE, BLOOD (ROUTINE X 2)     Status: None   Collection Time    12/31/13  6:30 PM      Result Value Ref Range Status   Specimen Description BLOOD LEFT WRIST   Final   Special Requests BOTTLES DRAWN AEROBIC AND ANAEROBIC 6 ML   Final   Culture  Setup Time     Final   Value: 12/31/2013 22:37     Performed at [facility]   Culture     Final   Value: NO GROWTH 5 DAYS     Performed at [facility]   Report Status 01/06/2014 FINAL   Final  CULTURE, BLOOD (ROUTINE X 2)     Status: None   Collection Time    12/31/13  6:40 PM      Result Value Ref Range Status   Specimen Description BLOOD RIGHT HAND   Final   Special Requests BOTTLES DRAWN AEROBIC AND ANAEROBIC 4 ML   Final   Culture  Setup Time  Final   Value: 12/31/2013 22:38     Performed at [facility]   Culture     Final   Value: NO GROWTH 5 DAYS     Performed at [facility]   Report Status 01/06/2014 FINAL   Final  MRSA PCR SCREENING     Status: None   Collection Time    01/01/14  1:51 AM      Result Value Ref Range Status   MRSA by PCR NEGATIVE  NEGATIVE Final   Comment:            The GeneXpert MRSA Assay (FDA     approved for NASAL specimens     only), is one component of a     comprehensive MRSA colonization     surveillance program. It is not     intended to diagnose MRSA     infection nor to guide or     monitor treatment for     MRSA infections.  GRAM STAIN     Status: None   Collection Time    01/01/14 10:05 AM      Result Value Ref Range Status   Specimen Description PLEURAL   Final   Special Requests NONE   Final   Gram Stain     Final   Value: CYTOSPIN     WBC PRESENT,BOTH PMN AND MONONUCLEAR     NO ORGANISMS SEEN     Gram Stain Report Called to,Read Back By and Verified With:  A. ARNOLD RN AT 1050 ON 08.18.15 BY SHUEA   Report Status 01/01/2014 FINAL   Final  BODY FLUID CULTURE     Status: None   Collection Time    01/01/14 10:05 AM      Result Value Ref Range Status   Specimen Description PLEURAL   Final   Special Requests Normal   Final   Gram Stain     Final   Value: CYTOSPIN SLIDE WBC PRESENT,BOTH PMN AND MONONUCLEAR     NO ORGANISMS SEEN     Gram Stain Report Called to,Read Back By and Verified With: Gram Stain Report Called to,Read Back By and Verified With: A ARNOLD RN 1050AM 01/01/14 BY SHUEA Performed at [facility]     Performed at [facility]   Culture     Final   Value: NO GROWTH 3 DAYS     Performed at [facility]   Report Status 01/04/2014 FINAL   Final  AFB CULTURE WITH SMEAR     Status: None   Collection Time    01/04/14  9:52 AM      Result Value Ref Range Status   Specimen Description PLEURAL   Final   Special Requests Normal   Final   Acid Fast Smear     Final   Value: NO ACID FAST BACILLI SEEN     Performed at [facility]   Culture     Final   Value: CULTURE WILL BE EXAMINED FOR 6 WEEKS BEFORE ISSUING A FINAL REPORT     Performed at [facility]   Report Status PENDING   Incomplete  BODY FLUID CULTURE     Status: None   Collection Time    01/04/14  9:52 AM      Result Value Ref Range Status   Specimen Description PLEURAL   Final   Special Requests Normal   Final   Gram Stain     Final  Value: RARE WBC PRESENT, PREDOMINANTLY PMN     NO ORGANISMS SEEN     Performed at [facility]   Culture     Final   Value: NO GROWTH 3 DAYS     Performed at [facility]   Report Status 01/07/2014 FINAL   Final  FUNGUS CULTURE W SMEAR     Status: None   Collection Time    01/04/14  9:52 AM      Result Value Ref Range Status   Specimen Description PLEURAL   Final   Special Requests NONE   Final   Fungal Smear     Final   Value: NO YEAST OR FUNGAL ELEMENTS SEEN     Performed at  [facility]   Culture     Final   Value: CULTURE IN PROGRESS FOR FOUR WEEKS     Performed at [facility]   Report Status PENDING   Incomplete    Assessment: 37 yoM with hx polysubstance abuse and recent admission for rhabdomyolysis and aspiration PNA presents to [facility] on 8/17 with possible recurrent PNA vs pleural effusion. MD starting cefepime and pharmacy consulted to dose vancomycin. First doses of zosyn 3.375g x1 and vancomycin 1500mg  IV x 1 given in ED already.   8/17 >> Zosyn x 1 8/17 >> Vanc >> 8/20 8/17 >> Cefepime >> 8/28 8/28 >> Levaquin >>  Tmax: AF WBCs: slightly elev now Renal: SCr wnl, CrCl >100 (N and CG)  8/17 blood: NGF 8/17 MRSA PCR: neg 8/17 strep pneumo ur ag: neg 8/17 legionella ur ag: neg 8/18 pleural fluid: NGF 8/21 pleural fluid: NGF. No AFB or fungus.  Goal of Therapy:  Eradication of infection, dosing appropriate for indication/renal function  Plan:   Increase Levaquin from 500mg  to 750mg  PO daily per protocol for pneumonia.   Pharmacy will sign-off.   Romeo Rabon, PharmD, pager [phone]. 01/11/2014,1:36 PM.

## 2014-01-11 NOTE — Progress Notes (Addendum)
Physician Discharge Summary  Arcenio Mullaly KNL:[id number] DOB: [birth year] DOA: 12/31/2013  PCP: No PCP Per Patient  Admit date: 12/31/2013 Discharge date: 01/12/2014   Recommendations for Outpatient Follow-up:  1. Please note that pt has been seen earlier in the day but left later [facility]   Discharge Diagnoses:  Principal Problem:   Loculated pleural effusion Active Problems:   Paresthesia of both lower extremities with weakness   Body aches   Chest pain   Sepsis   Discharge Condition: Agitated, left AMA   Diet recommendation: Heart healthy diet discussed in details   History of present illness:  43 y/o male with a history of polysubstance abuse admitted on 8/17 with increasing dyspnea and a large R pleural effusion after a recent episode of RLL pneumonia. Pt with history of IVDA, EtOH, tobacco.   Please note that pt left later in the day AMA.  SIGNIFICANT EVENTS:  8/06 admitted for inability to walk chest pain, rhabdomyolysis  8/06 MRI Thoracic spine > no acute significant findings in thoracic spine, tiny t8-t9 disc protusion, RLL pneumonia  8/10 LHC > no CAD  8/11 discharged home on augmentin  8/18 Placement of R CT  8/19 50mg  TPA intrapleural  8/20 Improved CXR post placement of TPA  8/21 R thora > 750cc removed  8/23 R Chest tube removed  8/25 IR placed Rt chest tube, -20 cm suction  8/26 Change chest tube to -10 cm suction  8/27 Change chest tube to water seal   Assessment and Plan:   Acute hypoxic respiratory failure  - secondary to HCAP  - Day 13 of Abx, currently on cefepime and was transitioned to Levaquin  - Rt pleural fluid AFB 8/21 >>  - Rt pleural fluid fungal cx 8/21 >>  - bronchial hygiene provided - since pt left AMA later in the day, we were unable to provide discharge instructions and scripts  Loculated Rt parapneumonic pleural effusion.  - F/u CXR 8/27  - appreciate PCCM input  Tobacco abuse.  - Smoking cessation discussed while inpatient  - PRN BDs   Leukocytosis  - secondary to pleural effusion, HCAP  HTN, accelerated  - added hydralazine, SBP in 140's   Code Status: Full  Family Communication: Pt at bedside   IV Access:   Peripheral IV Procedures and diagnostic studies:   8/06 MRI thoracic spine > no acute findings, tiny T8-9 disc protrusion w/o impingement, R lung airspace disease  8/18 CT chest > large R pleural effusion with compressive atelectasis of R lung, some left shift noted  8/21 CT chest > rt effusions, loculations +, RLL consolidation  8/24 MRI Lumbar > tiny annular fissure at L5-S1 on LEFT   Medical Consultants:   IR  PCCM   Other Consultants:   Physical therapy   Anti-Infectives:   Maxipime stopped 8/28  Levaquin 8/28  Discharge Exam: Filed Vitals:   01/11/14 2035  BP: 151/91  Pulse: 125  Temp: 99.5 F (37.5 C)  Resp: 18   Filed Vitals:   01/11/14 0500 01/11/14 0600 01/11/14 1436 01/11/14 2035  BP: 170/102 150/84 140/91 151/91  Pulse: 96  98 125  Temp: 98.2 F (36.8 C)  97.8 F (36.6 C) 99.5 F (37.5 C)  TempSrc: Oral  Oral Oral  Resp: 20  18 18   Height:      Weight:      SpO2: 98%  94% 98%    General: Pt is alert, follows commands appropriately, pt agitated and combative  Cardiovascular: Regular  rate and rhythm, S1/S2 +, no murmurs, no rubs, no gallops Respiratory: Chest tube in place  Abdominal: Soft, non tender, non distended, bowel sounds +, no guarding Extremities: no edema, no cyanosis, pulses palpable bilaterally DP and PT Neuro: Grossly nonfocal  Discharge Instructions     Medication List    ASK your doctor about these medications       amLODipine 5 MG tablet  Commonly known as:  NORVASC  Take 1 tablet (5 mg total) by mouth daily.     amoxicillin-clavulanate 875-125 MG per tablet  Commonly known as:  AUGMENTIN  Take 1 tablet by mouth 2 (two) times daily.     naproxen sodium 220 MG tablet  Commonly known as:  ANAPROX  Take 440 mg by mouth 2 (two) times daily as  needed (pain).     omeprazole 20 MG capsule  Commonly known as:  PRILOSEC  Take 20 mg by mouth daily.     oxyCODONE-acetaminophen 5-325 MG per tablet  Commonly known as:  ROXICET  Take 1 tablet by mouth every 6 (six) hours as needed.           Follow-up Information   Follow up with [facility]    . (Please follow up with [facility] in 1-2 weeks after discharge )    Contact information:   [address] [place] [address] [phone]      Follow up with Chesley Mires, MD On 02/01/2014. (12:00 PM)    Specialty:  Pulmonary Disease   Contact information:   [address] [address] [phone]        The results of significant diagnostics from this hospitalization (including imaging, microbiology, ancillary and laboratory) are listed below for reference.     Microbiology: Recent Results (from the past 240 hour(s))  CULTURE, BLOOD (ROUTINE X 2)     Status: None   Collection Time    01/18/14  1:04 PM      Result Value Ref Range Status   Specimen Description BLOOD RIGHT ANTECUBITAL   Final   Special Requests BOTTLES DRAWN AEROBIC AND ANAEROBIC 5ML   Final   Culture  Setup Time     Final   Value: 01/18/2014 20:45     Performed at [facility]   Culture     Final   Value:        BLOOD CULTURE RECEIVED NO GROWTH TO DATE CULTURE WILL BE HELD FOR 5 DAYS BEFORE ISSUING A FINAL NEGATIVE REPORT     Performed at [facility]   Report Status PENDING   Incomplete  CULTURE, BLOOD (ROUTINE X 2)     Status: None   Collection Time    01/18/14  1:07 PM      Result Value Ref Range Status   Specimen Description BLOOD RIGHT ARM   Final   Special Requests BOTTLES DRAWN AEROBIC ONLY 1ML   Final   Culture  Setup Time     Final   Value: 01/18/2014 20:45     Performed at [facility]   Culture     Final   Value:        BLOOD CULTURE RECEIVED NO GROWTH TO DATE CULTURE WILL BE HELD FOR 5 DAYS  BEFORE ISSUING A FINAL NEGATIVE REPORT     Performed at [facility]   Report Status PENDING   Incomplete  URINE CULTURE     Status: None  Collection Time    01/18/14  3:43 PM      Result Value Ref Range Status   Specimen Description URINE, RANDOM   Final   Special Requests NONE   Final   Culture  Setup Time     Final   Value: 01/18/2014 20:05     Performed at [facility]     Final   Value: NO GROWTH     Performed at [facility]   Culture     Final   Value: NO GROWTH     Performed at [facility]   Report Status 01/19/2014 FINAL   Final     Labs: Basic Metabolic Panel:  Recent Labs Lab 01/18/14 1306 01/19/14 0418  NA 139 140  K 3.4* 3.3*  CL 97 104  CO2 21 22  GLUCOSE 185* 99  BUN 15 13  CREATININE 1.48* 1.21  CALCIUM 10.6* 9.2   Liver Function Tests:  Recent Labs Lab 01/18/14 1306  AST 25  ALT 23  ALKPHOS 100  BILITOT 0.7  PROT 8.8*  ALBUMIN 3.6   No results found for this basename: LIPASE, AMYLASE,  in the last 168 hours No results found for this basename: AMMONIA,  in the last 168 hours CBC:  Recent Labs Lab 01/18/14 1306 01/19/14 0418  WBC 20.7* 14.6*  NEUTROABS 18.3*  --   HGB 13.4 10.9*  HCT 39.3 33.1*  MCV 88.3 88.3  PLT 610* 417*   BNP (last 3 results)  Recent Labs  12/24/13 0409  PROBNP 538.5*   CBG:  Recent Labs Lab 01/18/14 1243  GLUCAP 192*     SIGNED: Time coordinating discharge: Over 30 minutes  Faye Ramsay, MD  [facility] 01/19/2014, 8:53 PM Pager [phone]  If 7PM-7AM, please contact night-coverage [email or web address] Password [email or web address]

## 2014-01-11 NOTE — Progress Notes (Addendum)
This patient is receiving thiamine and folic acid. Based on criteria approved by the Pharmacy and Therapeutics Committee, these medications are being converted to the equivalent oral dose form. These criteria include:   . The patient is eating (either orally or per tube) and/or has been taking other orally administered medications for at least 24 hours.  . This patient has no evidence of active gastrointestinal bleeding or impaired GI absorption (gastrectomy, short bowel, patient on TNA or NPO).   If you have questions about this conversion, please contact the pharmacy department.  Romeo Rabon, PharmD, pager [phone]. 01/11/2014,10:19 AM.

## 2014-01-11 NOTE — Progress Notes (Signed)
Subjective: Pt just finished walking in hallway with PT; no new c/o  Objective: Vital signs in last 24 hours: Temp:  [97.9 F (36.6 C)-98.6 F (37 C)] 98.2 F (36.8 C) (08/28 0500) Pulse Rate:  [96-115] 96 (08/28 0500) Resp:  [20] 20 (08/28 0500) BP: (145-170)/(84-102) 150/84 mmHg (08/28 0600) SpO2:  [96 %-98 %] 98 % (08/28 0500) Last BM Date: 01/10/14  Intake/Output from previous day: 08/27 0701 - 08/28 0700 In: 1110 [P.O.:840; IV Piggyback:150] Out: 25 [Chest Tube:25] Intake/Output this shift: Total I/O In: 240 [P.O.:240] Out: -   Rt chest tube intact, output minimal,CXR stable- no ptx  Lab Results:   Recent Labs  01/10/14 0012 01/11/14 0030  WBC 10.5 13.9*  HGB 12.2* 12.1*  HCT 37.5* 36.7*  PLT 524* 636*   BMET  Recent Labs  01/10/14 0012 01/11/14 0020  NA 139 136*  K 3.9 4.1  CL 99 97  CO2 28 26  GLUCOSE 118* 114*  BUN 7 8  CREATININE 0.69 0.80  CALCIUM 9.5 9.5   PT/INR No results found for this basename: LABPROT, INR,  in the last 72 hours ABG No results found for this basename: PHART, PCO2, PO2, HCO3,  in the last 72 hours  Studies/Results: Dg Chest Port 1 View  01/11/2014   CLINICAL DATA:  Pneumonia.  Right-sided chest tube.  EXAM: PORTABLE CHEST - 1 VIEW  COMPARISON:  01/10/2014.  FINDINGS: Pigtail catheter in the inferior right hemi thorax is stable. There is surrounding right lung base consolidation. The lungs show diffuse irregular interstitial densities, also unchanged. No pneumothorax.  Cardiac silhouette is mildly enlarged.  IMPRESSION: No change from the previous day's study.  No pneumothorax.   Electronically Signed   By: Lajean Manes M.D.   On: 01/11/2014 07:14   Dg Chest Port 1 View  01/10/2014   CLINICAL DATA:  Pneumonia. Right pleural effusion. Right chest tube.  EXAM: PORTABLE CHEST - 1 VIEW  COMPARISON:  01/09/2014.  FINDINGS: Right chest tube in stable position. Mediastinum and hilar structures are stable. Stable cardiomegaly  with pulmonary venous congestion and bilateral interstitial prominence consistent with congestive heart failure. Stable small right pleural effusion. No pneumothorax. No acute osseus abnormality.  IMPRESSION: 1. Right chest tube in stable position. Stable small right pleural effusion. 2. Stable changes of congestive heart failure and pulmonary interstitial edema.   Electronically Signed   By: Marcello Moores  Register   On: 01/10/2014 07:46    Anti-infectives: Anti-infectives   Start     Dose/Rate Route Frequency Ordered Stop   01/11/14 1200  levofloxacin (LEVAQUIN) tablet 500 mg     500 mg Oral Daily 01/11/14 1031     01/03/14 1600  vancomycin (VANCOCIN) IVPB 1000 mg/200 mL premix  Status:  Discontinued     1,000 mg 200 mL/hr over 60 Minutes Intravenous Every 8 hours 01/03/14 0812 01/03/14 1010   01/01/14 0800  vancomycin (VANCOCIN) IVPB 1000 mg/200 mL premix  Status:  Discontinued     1,000 mg 200 mL/hr over 60 Minutes Intravenous Every 12 hours 12/31/13 2118 01/03/14 0812   01/01/14 0200  ceFEPIme (MAXIPIME) 1 g in dextrose 5 % 50 mL IVPB  Status:  Discontinued     1 g 100 mL/hr over 30 Minutes Intravenous Every 8 hours 12/31/13 2134 01/11/14 1031   12/31/13 2200  aztreonam (AZACTAM) 2 g in dextrose 5 % 50 mL IVPB  Status:  Discontinued     2 g 100 mL/hr over 30 Minutes Intravenous 3  times per day 12/31/13 2053 12/31/13 2107   12/31/13 1830  vancomycin (VANCOCIN) 1,500 mg in sodium chloride 0.9 % 500 mL IVPB     1,500 mg 250 mL/hr over 120 Minutes Intravenous  Once 12/31/13 1821 01/03/14 1900   12/31/13 1830  piperacillin-tazobactam (ZOSYN) IVPB 3.375 g     3.375 g 100 mL/hr over 30 Minutes Intravenous  Once 12/31/13 1821 12/31/13 1914      Assessment/Plan: s/p rt chest tube placement 8/25 for loculated pleural eff; per request of CCM, rt chest tube removed today in its entirety without immediate complications; vaseline gauze dressing applied to site. Other plans as per CCM/IM.   LOS: 11  days    ALLRED,D [facility] 01/11/2014

## 2014-01-11 NOTE — Progress Notes (Signed)
Patient told RN to leave the room. Patient refused all night time medications. Patient refused assessment.  A/C was notified that patient wants to go to [facility]. A/C coming to see patient.

## 2014-01-11 NOTE — Progress Notes (Signed)
PULMONARY / CRITICAL CARE MEDICINE   Name: Ralph Chavez MRN: [id number] DOB: 07/18/1970    ADMISSION DATE:  12/31/2013 CONSULTATION DATE:  01/01/2014  REFERRING MD :  Dyann Kief TRH  CHIEF COMPLAINT:  Shortness of breath, pain all over  INITIAL PRESENTATION: 43 y/o male with a history of polysubstance abuse admitted on 8/17 with increasing dyspnea and a large R pleural effusion after a recent episode of RLL pneumonia.  STUDIES:  8/06 MRI thoracic spine > no acute findings, tiny T8-9 disc protrusion w/o impingement, R lung airspace disease 8/06 MRI Thoracic spine > no acute significant findings in thoracic spine, tiny t8-t9 disc protusion, RLL pneumonia 8/10 LHC > no CAD 8/18 CT chest > large R pleural effusion with compressive atelectasis of R lung, some left shift noted 8/21 CT chest > rt effusions, loculations +, RLL consolidation 8/21 R thora > 750cc removed 8/24 MRI Lumbar > tiny annular fissure at L5-S1 on LEFT  SIGNIFICANT EVENTS: 8/06  admitted for inability to walk chest pain, rhabdomyolysis 8/11  discharged home on augmentin 8/18  Placement of R CT 8/19  50mg  TPA intrapleural 8/20  Improved CXR post placement of TPA  8/23  R Chest tube removed 8/25  IR placed Rt chest tube, -20 cm suction 8/26  Change chest tube to -10 cm suction 8/27  Change chest tube to water seal 8/28  D/c Rt chest tube  SUBJECTIVE:   Minimal outpt from chest tube.  Breathing better.  He feels sleepy, and doesn't want to talk much now.  VITAL SIGNS: Temp:  [97.9 F (36.6 C)-98.6 F (37 C)] 98.2 F (36.8 C) (08/28 0500) Pulse Rate:  [96-115] 96 (08/28 0500) Resp:  [20] 20 (08/28 0500) BP: (145-170)/(84-102) 150/84 mmHg (08/28 0600) SpO2:  [96 %-98 %] 98 % (08/28 0500)  INTAKE / OUTPUT: Intake/Output     08/27 0701 - 08/28 0700 08/28 0701 - 08/29 0700   P.O. 840    I.V. (mL/kg)     Other 120    IV Piggyback 150    Total Intake(mL/kg) 1110 (11.6)    Urine (mL/kg/hr)     Chest Tube 25 (0)     Total Output 25     Net +1085          Urine Occurrence 11 x    Stool Occurrence 2 x    Emesis Occurrence 3 x      PHYSICAL EXAMINATION: Gen: no distress HEENT: no sinus tenderness PULM: decreased breath sounds, Rt chest tube in place CV: regular, tachycardic AB: soft, non tender Ext: no edema Neuro: alert, improved strength Lt leg  LABS:  CBC  Recent Labs Lab 01/09/14 0335 01/10/14 0012 01/11/14 0030  WBC 10.5 10.5 13.9*  HGB 11.4* 12.2* 12.1*  HCT 35.4* 37.5* 36.7*  PLT 454* 524* 636*   BMET  Recent Labs Lab 01/09/14 0335 01/10/14 0012 01/11/14 0020  NA 138 139 136*  K 3.8 3.9 4.1  CL 98 99 97  CO2 30 28 26   BUN 7 7 8   CREATININE 0.70 0.69 0.80  GLUCOSE 137* 118* 114*   Electrolytes  Recent Labs Lab 01/09/14 0335 01/10/14 0012 01/11/14 0020  CALCIUM 9.1 9.5 9.5   Imaging Dg Chest Port 1 View  01/10/2014   CLINICAL DATA:  Pneumonia. Right pleural effusion. Right chest tube.  EXAM: PORTABLE CHEST - 1 VIEW  COMPARISON:  01/09/2014.  FINDINGS: Right chest tube in stable position. Mediastinum and hilar structures are stable. Stable cardiomegaly with pulmonary venous  congestion and bilateral interstitial prominence consistent with congestive heart failure. Stable small right pleural effusion. No pneumothorax. No acute osseus abnormality.  IMPRESSION: 1. Right chest tube in stable position. Stable small right pleural effusion. 2. Stable changes of congestive heart failure and pulmonary interstitial edema.   Electronically Signed   By: Marcello Moores  Register   On: 01/10/2014 07:46     Impression: 43 yo male smoker with HCAP and Rt parapneumonic pleural effusion.  Hx of IVDA, ETOH.  HCAP. Plan: Day 12 of Abx, currently on cefepime >> can transition to oral Abx 8/28 >> d/w Dr. Doyle Askew  Rt pleural fluid AFB 8/21 >> Rt pleural fluid fungal cx 8/21 >>   Loculated Rt parapneumonic pleural effusion. Plan: D/c Rt chest tube 8/28 >> d/w IR F/u CXR 8/29  Acute  hypoxic respiratory failure 2nd to HCAP >> resolved. Plan: Monitor oxygenation Bronchial hygiene  Tobacco abuse. Plan: Smoking cessation PRN BDs  Summary: Plan d/w Dr. Doyle Askew and IR.  Okay to transition to oral Abx 8/28 and d/c Rt chest tube.  Will f/u CXR 8/29 >> if stable, then okay to d/c home from pulmonary standpoint on 8/29.  He should have follow CXR with outpt primary care early part of next week.  I have scheduled him for outpt pulmonary follow up with me on September 18 at 49 pm.  Chesley Mires, MD [place] 01/11/2014, 10:32 AM Pager:  [phone] After 3pm call: [phone]

## 2014-01-11 NOTE — Progress Notes (Signed)
Physical Therapy Treatment Patient Details Name: Ralph Chavez MRN: [id number] DOB: 03/09/1971 Today's Date: 01/11/2014    History of Present Illness 43 yo male admitted 12/31/13 with SOB, found to have  Pleural effusion, Chest tube removed 01/06/14. Recently in hospital for fall, rhabdo. Hx substance abuse. Pigtail cathether placement scheduled for 8/25    PT Comments    Assisted pt OOb to amb in hallway then back to bed.  Pt demon increased gait balance but still needed to use RW for safety.    Follow Up Recommendations  Outpatient PT     Equipment Recommendations  Rolling walker with 5" wheels    Recommendations for Other Services       Precautions / Restrictions Precautions Precautions: Fall Precaution Comments: chest tube Restrictions Weight Bearing Restrictions: No    Mobility  Bed Mobility Overal bed mobility: Modified Independent             General bed mobility comments: increased time  Transfers Overall transfer level: Needs assistance Equipment used: Rolling walker (2 wheeled) Transfers: Sit to/from Stand Sit to Stand: Min guard;Min assist         General transfer comment: unsteady reaching for furniture or whatever for support so had pt use RW  Ambulation/Gait Ambulation/Gait assistance: Min guard Ambulation Distance (Feet): 500 Feet Assistive device: Rolling walker (2 wheeled) Gait Pattern/deviations: Step-through pattern Gait velocity: decreased   General Gait Details: Very unsteady/drunken gait with slow/delayed corrective reaction.  HIGH FALL RISK.  VC's for safety esp with turns and proper walker to self distance with upright posture.   Stairs            Wheelchair Mobility    Modified Rankin (Stroke Patients Only)       Balance                                    Cognition                            Exercises      General Comments        Pertinent Vitals/Pain      Home Living                       Prior Function            PT Goals (current goals can now be found in the care plan section) Progress towards PT goals: Progressing toward goals    Frequency  Min 3X/week    PT Plan      Co-evaluation             End of Session Equipment Utilized During Treatment: Gait belt Activity Tolerance: Patient limited by fatigue;Patient limited by pain Patient left: in bed;with call bell/phone within reach;with nursing/sitter in room     Time: 3662-9476 PT Time Calculation (min): 24 min  Charges:  $Gait Training: 23-37 mins                    G Codes:       Rica Koyanagi  PTA [facility]  Acute  Rehab Pager      [phone]

## 2014-01-12 ENCOUNTER — Emergency Department (HOSPITAL_COMMUNITY)
Admission: EM | Admit: 2014-01-12 | Discharge: 2014-01-12 | Disposition: A | Discharge status: Home / Self Care | Payer: Self-pay | Attending: Emergency Medicine | Admitting: Emergency Medicine

## 2014-01-12 ENCOUNTER — Encounter (HOSPITAL_COMMUNITY): Payer: Self-pay | Admitting: Emergency Medicine

## 2014-01-12 DIAGNOSIS — Z8701 Personal history of pneumonia (recurrent): Secondary | ICD-10-CM | POA: Insufficient documentation

## 2014-01-12 DIAGNOSIS — Z872 Personal history of diseases of the skin and subcutaneous tissue: Secondary | ICD-10-CM | POA: Insufficient documentation

## 2014-01-12 DIAGNOSIS — F17200 Nicotine dependence, unspecified, uncomplicated: Secondary | ICD-10-CM | POA: Insufficient documentation

## 2014-01-12 DIAGNOSIS — Z8709 Personal history of other diseases of the respiratory system: Secondary | ICD-10-CM | POA: Insufficient documentation

## 2014-01-12 DIAGNOSIS — F411 Generalized anxiety disorder: Secondary | ICD-10-CM | POA: Insufficient documentation

## 2014-01-12 DIAGNOSIS — Z79899 Other long term (current) drug therapy: Secondary | ICD-10-CM | POA: Insufficient documentation

## 2014-01-12 DIAGNOSIS — M25569 Pain in unspecified knee: Secondary | ICD-10-CM | POA: Insufficient documentation

## 2014-01-12 LAB — BASIC METABOLIC PANEL
ANION GAP: 11 (ref 5–15)
BUN: 10 mg/dL (ref 6–23)
CHLORIDE: 102 meq/L (ref 96–112)
CO2: 28 mEq/L (ref 19–32)
Calcium: 9.5 mg/dL (ref 8.4–10.5)
Creatinine, Ser: 0.73 mg/dL (ref 0.50–1.35)
Glucose, Bld: 112 mg/dL — ABNORMAL HIGH (ref 70–99)
POTASSIUM: 3.8 meq/L (ref 3.7–5.3)
SODIUM: 141 meq/L (ref 137–147)

## 2014-01-12 LAB — CBC
HEMATOCRIT: 32.4 % — AB (ref 39.0–52.0)
Hemoglobin: 10.5 g/dL — ABNORMAL LOW (ref 13.0–17.0)
MCH: 29.2 pg (ref 26.0–34.0)
MCHC: 32.4 g/dL (ref 30.0–36.0)
MCV: 90.3 fL (ref 78.0–100.0)
PLATELETS: 642 10*3/uL — AB (ref 150–400)
RBC: 3.59 MIL/uL — ABNORMAL LOW (ref 4.22–5.81)
RDW: 15.1 % (ref 11.5–15.5)
WBC: 12 10*3/uL — AB (ref 4.0–10.5)

## 2014-01-12 MED ORDER — ALPRAZOLAM 0.5 MG PO TABS: 0.5000 mg | ORAL_TABLET | Freq: Once | ORAL | Status: DC

## 2014-01-12 NOTE — ED Notes (Signed)
Assisted pt to the restroom via wheelchair. Pt refused to use a 'pink wheelchair'. This RN walked next to pt as he walked back to room without incident. Pt cursing the entire way. Stating he didn't want to come to this 'shit hole hospital'. MD in to eval pt

## 2014-01-12 NOTE — ED Notes (Signed)
Per EMS: pt c/o bilateral knee pain; pt apparently signed out AMA from [facility] this am; pt refuses to answer most questions and is non cooperative at times; pt appears to have had chest tube at some point

## 2014-01-12 NOTE — Progress Notes (Signed)
The patient has started to get agitated again.  The patient threw the walker into the hallway. The PCP on call was notified.

## 2014-01-12 NOTE — Progress Notes (Signed)
Supervisor,Police,security PCP on call were all in patient's room. Patient signed the [facility] papers.

## 2014-01-12 NOTE — Discharge Instructions (Signed)
If your change your mind about being evaluated in the emergency room and will allow an exam and choose to answer questions you may return at anytime.

## 2014-01-12 NOTE — ED Notes (Signed)
Pt refused to sign anything and ripped up discharge papers; [employer] and security at bedside to escort pt out; pt refused wheelchair

## 2014-01-12 NOTE — Progress Notes (Signed)
The PIV was removed. Patient is packing to leave the hospital. Patient will leave shortly.

## 2014-01-12 NOTE — Progress Notes (Addendum)
[facility] hospitalist progress note. Chief complaint. Agitation, anger. This 43 year old male in hospital with a complicated medical history including refractory pneumonia, chest tube for pleural effusion, tobacco abuse, hypertension, etc. Patient has been angry overnight being verbally abusive to nursing staff, through a walker out of his room into the hallway and smashed his cell phone against the wall. House supervisor and I came up to see the patient at the bedside. Indicates that he is unhappy with his hospital stay and intends to [facility] and go to [facility]. I attempted did discuss patient's feelings to ascertain the reason for his anger the patient was largely uncommunicative. I told him that his best option would be to continue with his stay at [facility] until discharge that the patient appears angry and set in his mind to leave AMA. I believe the patient is competent to make this decision. Staff will notify me if he changes his mind. I told the patient there was no way for me to directly transfer him to [facility] if that was his desire and then he would have to wait until later this morning to see if these arrangements could be made.

## 2014-01-12 NOTE — ED Provider Notes (Addendum)
CSN: [id number]     Arrival date & time 01/12/14  0756 History   First MD Initiated Contact with Patient 01/12/14 0813     Chief Complaint  Patient presents with  . Knee Pain     HPI  Patient presents to the emergency room with chief complaint via EMS that he had bilateral knee pain. Apparently left [facility] within the last 24 hours after a stay for empyema and pneumonia. Left AGAINST MEDICAL ADVICE.  On his arrival he is immediately verbally abusive profane 22 nurses in my presence. I witnessed both of these nurses asking him to simply provide information. They're quite pleasant with him in patient with him. However he refuses to cooperate  Past Medical History  Diagnosis Date  . Anxiety   . Ulcer    History reviewed. No pertinent past surgical history. History reviewed. No pertinent family history. History  Substance Use Topics  . Smoking status: Current Every Day Smoker    Types: Cigarettes  . Smokeless tobacco: Never Used  . Alcohol Use: Yes    Review of Systems  Unable to perform ROS: Other   she refuses to answer any questions.    Allergies  Review of patient's allergies indicates no known allergies.  Home Medications   Prior to Admission medications   Medication Sig Start Date End Date Taking? Authorizing Provider  amLODipine (NORVASC) 5 MG tablet Take 1 tablet (5 mg total) by mouth daily. 12/25/13   Verlee Monte, MD  amoxicillin-clavulanate (AUGMENTIN) 875-125 MG per tablet Take 1 tablet by mouth 2 (two) times daily. 12/25/13   Verlee Monte, MD  naproxen sodium (ANAPROX) 220 MG tablet Take 440 mg by mouth 2 (two) times daily as needed (pain).    Historical Provider, MD  omeprazole (PRILOSEC) 20 MG capsule Take 20 mg by mouth daily.    Historical Provider, MD  oxyCODONE-acetaminophen (ROXICET) 5-325 MG per tablet Take 1 tablet by mouth every 6 (six) hours as needed. 12/25/13   Verlee Monte, MD   BP 128/86  Pulse 128  Temp(Src) 98.8 F (37.1 C) (Oral)  Resp  20  SpO2 98% Physical Exam  I'm unable to perform physical exam as the patient refuses.  ED Course  Procedures (including critical care time) Labs Review Labs Reviewed - No data to display  Imaging Review Dg Chest [facility] 1 View  01/11/2014   CLINICAL DATA:  Pneumonia.  Right-sided chest tube.  EXAM: PORTABLE CHEST - 1 VIEW  COMPARISON:  01/10/2014.  FINDINGS: Pigtail catheter in the inferior right hemi thorax is stable. There is surrounding right lung base consolidation. The lungs show diffuse irregular interstitial densities, also unchanged. No pneumothorax.  Cardiac silhouette is mildly enlarged.  IMPRESSION: No change from the previous day's study.  No pneumothorax.   Electronically Signed   By: Lajean Manes M.D.   On: 01/11/2014 07:14     EKG Interpretation None      MDM   Final diagnoses:  Knee pain, unspecified laterality    Patient will on exam. Ask him to examine he states "you're not touching me".  The patient was not allowed to examine him without his consent at this constitute battery. He refuses to let me examine him. He states "I worry told the story to many times". He will not answer questions or even simply addressed to me why he is here. History as note states that he is complaining of knee pain. Do not allow temperature. Temperature at triage is 98.8. Pulse in  the room is 103.   I did make a statement to him that I realized he was recently in the hospital critically ill and encouraged him to undergo evaluation. He will not answer or respond to this statement.  This is not appear to be limited by altered mental status or acute medical illness that would preclude his participation in a medical screening exam.    Tanna Furry, MD 01/12/14 5789  Tanna Furry, MD 01/12/14 [id number]

## 2014-01-12 NOTE — Progress Notes (Signed)
Patient threw a walker in hallway and has been very aggressive towards all members of staff. He also has thrown a phone across the room. Security has been called.  The Policeman was asked to come to the patient's room. PCP was called.

## 2014-01-18 ENCOUNTER — Emergency Department (HOSPITAL_COMMUNITY): Payer: Self-pay

## 2014-01-18 ENCOUNTER — Inpatient Hospital Stay (HOSPITAL_COMMUNITY)
Admission: EM | Admit: 2014-01-18 | Discharge: 2014-01-30 | DRG: 871 | Disposition: A | Discharge status: Home / Self Care | Payer: Self-pay | Attending: Internal Medicine | Admitting: Internal Medicine

## 2014-01-18 ENCOUNTER — Encounter (HOSPITAL_COMMUNITY): Payer: Self-pay | Admitting: Emergency Medicine

## 2014-01-18 DIAGNOSIS — Z79899 Other long term (current) drug therapy: Secondary | ICD-10-CM

## 2014-01-18 DIAGNOSIS — J90 Pleural effusion, not elsewhere classified: Secondary | ICD-10-CM | POA: Diagnosis present

## 2014-01-18 DIAGNOSIS — R778 Other specified abnormalities of plasma proteins: Secondary | ICD-10-CM

## 2014-01-18 DIAGNOSIS — Z9119 Patient's noncompliance with other medical treatment and regimen: Secondary | ICD-10-CM

## 2014-01-18 DIAGNOSIS — F1594 Other stimulant use, unspecified with stimulant-induced mood disorder: Secondary | ICD-10-CM

## 2014-01-18 DIAGNOSIS — R52 Pain, unspecified: Secondary | ICD-10-CM

## 2014-01-18 DIAGNOSIS — E876 Hypokalemia: Secondary | ICD-10-CM | POA: Diagnosis present

## 2014-01-18 DIAGNOSIS — R739 Hyperglycemia, unspecified: Secondary | ICD-10-CM

## 2014-01-18 DIAGNOSIS — J189 Pneumonia, unspecified organism: Secondary | ICD-10-CM | POA: Diagnosis present

## 2014-01-18 DIAGNOSIS — R202 Paresthesia of skin: Secondary | ICD-10-CM

## 2014-01-18 DIAGNOSIS — F29 Unspecified psychosis not due to a substance or known physiological condition: Secondary | ICD-10-CM | POA: Diagnosis present

## 2014-01-18 DIAGNOSIS — R740 Nonspecific elevation of levels of transaminase and lactic acid dehydrogenase [LDH]: Secondary | ICD-10-CM

## 2014-01-18 DIAGNOSIS — R7989 Other specified abnormal findings of blood chemistry: Secondary | ICD-10-CM

## 2014-01-18 DIAGNOSIS — Z91199 Patient's noncompliance with other medical treatment and regimen due to unspecified reason: Secondary | ICD-10-CM

## 2014-01-18 DIAGNOSIS — J181 Lobar pneumonia, unspecified organism: Secondary | ICD-10-CM

## 2014-01-18 DIAGNOSIS — I10 Essential (primary) hypertension: Secondary | ICD-10-CM | POA: Diagnosis present

## 2014-01-18 DIAGNOSIS — E872 Acidosis, unspecified: Secondary | ICD-10-CM | POA: Diagnosis present

## 2014-01-18 DIAGNOSIS — Z6828 Body mass index (BMI) 28.0-28.9, adult: Secondary | ICD-10-CM

## 2014-01-18 DIAGNOSIS — J869 Pyothorax without fistula: Secondary | ICD-10-CM | POA: Diagnosis present

## 2014-01-18 DIAGNOSIS — F1910 Other psychoactive substance abuse, uncomplicated: Secondary | ICD-10-CM | POA: Diagnosis present

## 2014-01-18 DIAGNOSIS — F22 Delusional disorders: Secondary | ICD-10-CM | POA: Diagnosis present

## 2014-01-18 DIAGNOSIS — F17200 Nicotine dependence, unspecified, uncomplicated: Secondary | ICD-10-CM | POA: Diagnosis present

## 2014-01-18 DIAGNOSIS — R7401 Elevation of levels of liver transaminase levels: Secondary | ICD-10-CM

## 2014-01-18 DIAGNOSIS — G9340 Encephalopathy, unspecified: Secondary | ICD-10-CM | POA: Diagnosis present

## 2014-01-18 DIAGNOSIS — E861 Hypovolemia: Secondary | ICD-10-CM | POA: Diagnosis present

## 2014-01-18 DIAGNOSIS — F1020 Alcohol dependence, uncomplicated: Secondary | ICD-10-CM | POA: Diagnosis present

## 2014-01-18 DIAGNOSIS — N179 Acute kidney failure, unspecified: Secondary | ICD-10-CM | POA: Diagnosis present

## 2014-01-18 DIAGNOSIS — R7309 Other abnormal glucose: Secondary | ICD-10-CM | POA: Diagnosis present

## 2014-01-18 DIAGNOSIS — E860 Dehydration: Secondary | ICD-10-CM | POA: Diagnosis present

## 2014-01-18 DIAGNOSIS — D72829 Elevated white blood cell count, unspecified: Secondary | ICD-10-CM

## 2014-01-18 DIAGNOSIS — A419 Sepsis, unspecified organism: Principal | ICD-10-CM | POA: Diagnosis present

## 2014-01-18 DIAGNOSIS — F1410 Cocaine abuse, uncomplicated: Secondary | ICD-10-CM | POA: Diagnosis present

## 2014-01-18 DIAGNOSIS — R000 Tachycardia, unspecified: Secondary | ICD-10-CM | POA: Diagnosis present

## 2014-01-18 DIAGNOSIS — F1994 Other psychoactive substance use, unspecified with psychoactive substance-induced mood disorder: Secondary | ICD-10-CM | POA: Diagnosis present

## 2014-01-18 DIAGNOSIS — F411 Generalized anxiety disorder: Secondary | ICD-10-CM | POA: Diagnosis present

## 2014-01-18 DIAGNOSIS — F1110 Opioid abuse, uncomplicated: Secondary | ICD-10-CM | POA: Diagnosis present

## 2014-01-18 LAB — COMPREHENSIVE METABOLIC PANEL WITH GFR
ALT: 23 U/L (ref 0–53)
AST: 25 U/L (ref 0–37)
Albumin: 3.6 g/dL (ref 3.5–5.2)
Alkaline Phosphatase: 100 U/L (ref 39–117)
Anion gap: 21 — ABNORMAL HIGH (ref 5–15)
BUN: 15 mg/dL (ref 6–23)
CO2: 21 meq/L (ref 19–32)
Calcium: 10.6 mg/dL — ABNORMAL HIGH (ref 8.4–10.5)
Chloride: 97 meq/L (ref 96–112)
Creatinine, Ser: 1.48 mg/dL — ABNORMAL HIGH (ref 0.50–1.35)
GFR calc Af Amer: 66 mL/min — ABNORMAL LOW
GFR calc non Af Amer: 57 mL/min — ABNORMAL LOW
Glucose, Bld: 185 mg/dL — ABNORMAL HIGH (ref 70–99)
Potassium: 3.4 meq/L — ABNORMAL LOW (ref 3.7–5.3)
Sodium: 139 meq/L (ref 137–147)
Total Bilirubin: 0.7 mg/dL (ref 0.3–1.2)
Total Protein: 8.8 g/dL — ABNORMAL HIGH (ref 6.0–8.3)

## 2014-01-18 LAB — URINALYSIS, ROUTINE W REFLEX MICROSCOPIC
Bilirubin Urine: NEGATIVE
Glucose, UA: NEGATIVE mg/dL
Ketones, ur: NEGATIVE mg/dL
Leukocytes, UA: NEGATIVE
Nitrite: NEGATIVE
Protein, ur: 30 mg/dL — AB
Specific Gravity, Urine: 1.016 (ref 1.005–1.030)
Urobilinogen, UA: 1 mg/dL (ref 0.0–1.0)
pH: 6.5 (ref 5.0–8.0)

## 2014-01-18 LAB — URINE MICROSCOPIC-ADD ON

## 2014-01-18 LAB — CBC WITH DIFFERENTIAL/PLATELET
BASOS PCT: 0 % (ref 0–1)
Basophils Absolute: 0.1 10*3/uL (ref 0.0–0.1)
Eosinophils Absolute: 0.2 10*3/uL (ref 0.0–0.7)
Eosinophils Relative: 1 % (ref 0–5)
HEMATOCRIT: 39.3 % (ref 39.0–52.0)
Hemoglobin: 13.4 g/dL (ref 13.0–17.0)
Lymphocytes Relative: 6 % — ABNORMAL LOW (ref 12–46)
Lymphs Abs: 1.2 10*3/uL (ref 0.7–4.0)
MCH: 30.1 pg (ref 26.0–34.0)
MCHC: 34.1 g/dL (ref 30.0–36.0)
MCV: 88.3 fL (ref 78.0–100.0)
MONO ABS: 1 10*3/uL (ref 0.1–1.0)
Monocytes Relative: 5 % (ref 3–12)
Neutro Abs: 18.3 10*3/uL — ABNORMAL HIGH (ref 1.7–7.7)
Neutrophils Relative %: 88 % — ABNORMAL HIGH (ref 43–77)
Platelets: 610 10*3/uL — ABNORMAL HIGH (ref 150–400)
RBC: 4.45 MIL/uL (ref 4.22–5.81)
RDW: 14.8 % (ref 11.5–15.5)
WBC: 20.7 10*3/uL — ABNORMAL HIGH (ref 4.0–10.5)

## 2014-01-18 LAB — ETHANOL: Alcohol, Ethyl (B): <11 mg/dL (ref 0–11)

## 2014-01-18 LAB — T4, FREE: Free T4: 1.69 ng/dL (ref 0.80–1.80)

## 2014-01-18 LAB — RAPID URINE DRUG SCREEN, HOSP PERFORMED
AMPHETAMINES: NOT DETECTED
Barbiturates: NOT DETECTED
Benzodiazepines: POSITIVE — AB
Cocaine: NOT DETECTED
Opiates: NOT DETECTED
TETRAHYDROCANNABINOL: NOT DETECTED

## 2014-01-18 LAB — ACETAMINOPHEN LEVEL: Acetaminophen (Tylenol), Serum: <15 ug/mL (ref 10–30)

## 2014-01-18 LAB — HEMOGLOBIN A1C
Hgb A1c MFr Bld: 5.8 % — ABNORMAL HIGH (ref <5.7)
Mean Plasma Glucose: 120 mg/dL — ABNORMAL HIGH (ref <117)

## 2014-01-18 LAB — SALICYLATE LEVEL

## 2014-01-18 LAB — CBG MONITORING, ED: GLUCOSE-CAPILLARY: 192 mg/dL — AB (ref 70–99)

## 2014-01-18 LAB — I-STAT CG4 LACTIC ACID, ED: Lactic Acid, Venous: 2.2 mmol/L (ref 0.5–2.2)

## 2014-01-18 LAB — I-STAT TROPONIN, ED: Troponin i, poc: 0 ng/mL (ref 0.00–0.08)

## 2014-01-18 LAB — TSH: TSH: 0.371 u[IU]/mL (ref 0.350–4.500)

## 2014-01-18 MED ORDER — OXYCODONE HCL 5 MG PO TABS
5.0000 mg | ORAL_TABLET | ORAL | Status: DC | PRN
  Administered 2014-01-21 – 2014-01-29 (×12): 5 mg via ORAL
  Filled 2014-01-18 (×12): qty 1

## 2014-01-18 MED ORDER — ADULT MULTIVITAMIN W/MINERALS CH
1.0000 | ORAL_TABLET | Freq: Every day | ORAL | Status: DC
  Administered 2014-01-18 – 2014-01-30 (×12): 1 via ORAL
  Filled 2014-01-18 (×14): qty 1

## 2014-01-18 MED ORDER — ONDANSETRON HCL 4 MG/2ML IJ SOLN
4.0000 mg | Freq: Four times a day (QID) | INTRAMUSCULAR | Status: DC | PRN
Start: 2014-01-18 — End: 2014-01-24

## 2014-01-18 MED ORDER — ENOXAPARIN SODIUM 40 MG/0.4ML ~~LOC~~ SOLN
40.0000 mg | SUBCUTANEOUS | Status: DC
  Administered 2014-01-18 – 2014-01-29 (×12): 40 mg via SUBCUTANEOUS
  Filled 2014-01-18 (×13): qty 0.4

## 2014-01-18 MED ORDER — AMLODIPINE BESYLATE 5 MG PO TABS
5.0000 mg | ORAL_TABLET | Freq: Every day | ORAL | Status: DC
  Administered 2014-01-18 – 2014-01-30 (×13): 5 mg via ORAL
  Filled 2014-01-18 (×14): qty 1

## 2014-01-18 MED ORDER — VANCOMYCIN HCL 10 G IV SOLR
2000.0000 mg | Freq: Once | INTRAVENOUS | Status: AC
  Administered 2014-01-18: 2000 mg via INTRAVENOUS
  Filled 2014-01-18: qty 2000

## 2014-01-18 MED ORDER — SODIUM CHLORIDE 0.9 % IV SOLN
1000.0000 mL | INTRAVENOUS | Status: DC
  Administered 2014-01-18: 1000 mL via INTRAVENOUS

## 2014-01-18 MED ORDER — PIPERACILLIN-TAZOBACTAM 3.375 G IVPB 30 MIN
3.3750 g | Freq: Once | INTRAVENOUS | Status: AC
  Administered 2014-01-18: 3.375 g via INTRAVENOUS
  Filled 2014-01-18: qty 50

## 2014-01-18 MED ORDER — FOLIC ACID 1 MG PO TABS
1.0000 mg | ORAL_TABLET | Freq: Every day | ORAL | Status: DC
  Administered 2014-01-18 – 2014-01-30 (×13): 1 mg via ORAL
  Filled 2014-01-18 (×13): qty 1

## 2014-01-18 MED ORDER — THIAMINE HCL 100 MG/ML IJ SOLN
100.0000 mg | Freq: Every day | INTRAMUSCULAR | Status: DC
  Filled 2014-01-18 (×13): qty 1

## 2014-01-18 MED ORDER — SODIUM CHLORIDE 0.9 % IV SOLN
INTRAVENOUS | Status: DC
  Administered 2014-01-19: 05:00:00 via INTRAVENOUS

## 2014-01-18 MED ORDER — LORAZEPAM 1 MG PO TABS: 1.0000 mg | ORAL_TABLET | Freq: Four times a day (QID) | ORAL | Status: AC | PRN

## 2014-01-18 MED ORDER — ACETAMINOPHEN 325 MG PO TABS
650.0000 mg | ORAL_TABLET | Freq: Four times a day (QID) | ORAL | Status: DC | PRN
  Administered 2014-01-21: 650 mg via ORAL
  Filled 2014-01-18: qty 2

## 2014-01-18 MED ORDER — VITAMIN B-1 100 MG PO TABS
100.0000 mg | ORAL_TABLET | Freq: Every day | ORAL | Status: DC
  Administered 2014-01-18 – 2014-01-30 (×13): 100 mg via ORAL
  Filled 2014-01-18 (×13): qty 1

## 2014-01-18 MED ORDER — LORAZEPAM 2 MG/ML IJ SOLN
0.0000 mg | Freq: Two times a day (BID) | INTRAMUSCULAR | Status: AC
  Administered 2014-01-21: 2 mg via INTRAVENOUS
  Filled 2014-01-18: qty 1

## 2014-01-18 MED ORDER — ACETAMINOPHEN 650 MG RE SUPP: 650.0000 mg | Freq: Four times a day (QID) | RECTAL | Status: DC | PRN

## 2014-01-18 MED ORDER — VANCOMYCIN HCL IN DEXTROSE 1-5 GM/200ML-% IV SOLN: 1000.0000 mg | Freq: Once | INTRAVENOUS | Status: DC

## 2014-01-18 MED ORDER — LORAZEPAM 2 MG/ML IJ SOLN
0.0000 mg | Freq: Four times a day (QID) | INTRAMUSCULAR | Status: AC
  Administered 2014-01-18: 2 mg via INTRAVENOUS
  Administered 2014-01-19: 1 mg via INTRAVENOUS
  Administered 2014-01-19: 2 mg via INTRAVENOUS
  Administered 2014-01-20: 1 mg via INTRAVENOUS
  Filled 2014-01-18 (×4): qty 1

## 2014-01-18 MED ORDER — SODIUM CHLORIDE 0.9 % IV BOLUS (SEPSIS)
2850.0000 mL | Freq: Once | INTRAVENOUS | Status: AC
  Administered 2014-01-18: 2850 mL via INTRAVENOUS

## 2014-01-18 MED ORDER — VANCOMYCIN HCL IN DEXTROSE 1-5 GM/200ML-% IV SOLN
1000.0000 mg | Freq: Three times a day (TID) | INTRAVENOUS | Status: DC
  Administered 2014-01-18 – 2014-01-20 (×6): 1000 mg via INTRAVENOUS
  Filled 2014-01-18 (×6): qty 200

## 2014-01-18 MED ORDER — LORAZEPAM 2 MG/ML IJ SOLN
1.0000 mg | Freq: Four times a day (QID) | INTRAMUSCULAR | Status: AC | PRN
Start: 2014-01-18 — End: 2014-01-21
  Administered 2014-01-18 – 2014-01-20 (×2): 1 mg via INTRAVENOUS
  Filled 2014-01-18 (×2): qty 1

## 2014-01-18 MED ORDER — PIPERACILLIN-TAZOBACTAM 3.375 G IVPB
3.3750 g | Freq: Three times a day (TID) | INTRAVENOUS | Status: DC
  Administered 2014-01-18 – 2014-01-21 (×9): 3.375 g via INTRAVENOUS
  Filled 2014-01-18 (×10): qty 50

## 2014-01-18 MED ORDER — ONDANSETRON HCL 4 MG PO TABS: 4.0000 mg | ORAL_TABLET | Freq: Four times a day (QID) | ORAL | Status: DC | PRN

## 2014-01-18 NOTE — ED Notes (Signed)
Pt given urinal and encouraged to urinate. Pt refusing at this moment.

## 2014-01-18 NOTE — ED Notes (Signed)
md at bedside

## 2014-01-18 NOTE — ED Provider Notes (Signed)
CSN: [id number]     Arrival date & time 01/18/14  1235 History   First MD Initiated Contact with Patient 01/18/14 1244     Chief complaint unknown L5 caveat uncooperativeness HPI The patient was brought to the emergency room by EMS. GPT was called to his house twice this morning because a neighbor stated that the patient was outside screaming.  EMS was contacted a second time. The patient was found outside of his leg between a concrete wall and a fence.  Patient here in the emergency department will not answer any questions. When I asked him what brought him to the emergency room he answers I don't know. I spent several minutes at the bedside attempting to get information from the patient. I asked him if he was hurting anything is bothering him. The patient states she will only tell me what he wants me to now.  He refuses to answer any other questions. He closes his eyes and lies in the bed. Past Medical History  Diagnosis Date  . Anxiety   . Ulcer    History reviewed. No pertinent past surgical history. History reviewed. No pertinent family history. History  Substance Use Topics  . Smoking status: Current Every Day Smoker    Types: Cigarettes  . Smokeless tobacco: Never Used  . Alcohol Use: Yes    Review of Systems  Unable to perform ROS: Acuity of condition      Allergies  Review of patient's allergies indicates no known allergies.  Home Medications   Prior to Admission medications   Medication Sig Start Date End Date Taking? Authorizing Provider  amLODipine (NORVASC) 5 MG tablet Take 1 tablet (5 mg total) by mouth daily. 12/25/13   Verlee Monte, MD  amoxicillin-clavulanate (AUGMENTIN) 875-125 MG per tablet Take 1 tablet by mouth 2 (two) times daily. 12/25/13   Verlee Monte, MD  naproxen sodium (ANAPROX) 220 MG tablet Take 440 mg by mouth 2 (two) times daily as needed (pain).    Historical Provider, MD  omeprazole (PRILOSEC) 20 MG capsule Take 20 mg by mouth daily.    Historical  Provider, MD  oxyCODONE-acetaminophen (ROXICET) 5-325 MG per tablet Take 1 tablet by mouth every 6 (six) hours as needed. 12/25/13   Verlee Monte, MD   BP 125/74  Pulse 117  Temp(Src) 98.1 F (36.7 C) (Oral)  Resp 25  SpO2 96% Physical Exam  Nursing note and vitals reviewed. Constitutional: He appears well-developed and well-nourished. No distress.  HENT:  Head: Normocephalic and atraumatic.  Right Ear: External ear normal.  Left Ear: External ear normal.  Eyes: Conjunctivae are normal. Right eye exhibits no discharge. Left eye exhibits no discharge. No scleral icterus.  Neck: Neck supple. No tracheal deviation present.  Cardiovascular: Regular rhythm and intact distal pulses.  Tachycardia present.   Pulmonary/Chest: Effort normal and breath sounds normal. No stridor. No respiratory distress. He has no wheezes. He has no rales.  Abdominal: Soft. Bowel sounds are normal. He exhibits no distension. There is no tenderness. There is no rebound and no guarding.  Musculoskeletal: He exhibits no edema and no tenderness.  A few abrasions noted on extremities, no erythema or effusions  Neurological: He is alert. He has normal strength. No cranial nerve deficit (no facial droop, extraocular movements intact, no slurred speech) or sensory deficit. He exhibits normal muscle tone. He displays no seizure activity. Coordination normal.   Patient refuses to answer any questions but he does move his extremities, he is alert and awake  and will answer questions, he does not appear lethargic  Skin: Skin is warm and dry. No rash noted.  Psychiatric: His affect is angry. He is not slowed and not withdrawn. He is noncommunicative.    ED Course  Procedures (including critical care time) Labs Review Labs Reviewed  CBC WITH DIFFERENTIAL - Abnormal; Notable for the following:    WBC 20.7 (*)    Platelets 610 (*)    Neutrophils Relative % 88 (*)    Neutro Abs 18.3 (*)    Lymphocytes Relative 6 (*)    All  other components within normal limits  COMPREHENSIVE METABOLIC PANEL - Abnormal; Notable for the following:    Potassium 3.4 (*)    Glucose, Bld 185 (*)    Creatinine, Ser 1.48 (*)    Calcium 10.6 (*)    Total Protein 8.8 (*)    GFR calc non Af Amer 57 (*)    GFR calc Af Amer 66 (*)    Anion gap 21 (*)    All other components within normal limits  SALICYLATE LEVEL - Abnormal; Notable for the following:    Salicylate Lvl <7.3 (*)    All other components within normal limits  CBG MONITORING, ED - Abnormal; Notable for the following:    Glucose-Capillary 192 (*)    All other components within normal limits  CULTURE, BLOOD (ROUTINE X 2)  CULTURE, BLOOD (ROUTINE X 2)  URINE CULTURE  ETHANOL  ACETAMINOPHEN LEVEL  URINALYSIS, ROUTINE W REFLEX MICROSCOPIC  URINE RAPID DRUG SCREEN (HOSP PERFORMED)  TSH  T4, FREE  I-STAT CG4 LACTIC ACID, ED  I-STAT TROPOININ, ED    Imaging Review Dg Chest Port 1 View  01/18/2014   CLINICAL DATA:  Tachycardia, chest pain, shortness of breath, unspecified drug use  EXAM: PORTABLE CHEST - 1 VIEW  COMPARISON:  Portable exam 1348 hr compared to 01/12/2014  FINDINGS: Enlargement of cardiac silhouette with slight pulmonary vascular congestion.  Mediastinal contours stable.  Peribronchial thickening with minimal RIGHT basilar atelectasis versus infiltrate little changed.  Remaining lungs clear.  No pleural effusion or pneumothorax.  IMPRESSION: Bronchitic changes with persistent atelectasis versus infiltrate at RIGHT base.   Electronically Signed   By: Lavonia Dana M.D.   On: 01/18/2014 13:59     EKG Interpretation   Date/Time:  Friday January 18 2014 12:41:25 EDT Ventricular Rate:  143 PR Interval:  91 QRS Duration: 107 QT Interval:  408 QTC Calculation: 629 R Axis:   63 Text Interpretation:  Sinus tachycardia Inferior infarct, age  indeterminate Prolonged QT interval limb lead reversal corrected since  last ECG Confirmed by Jalexa Pifer  MD-J, Taniaya Rudder ([id number]) on  01/18/2014 12:45:59 PM      MDM  Old records have been reviewed. Patient was an inpatient and was just released AMA on August 29.  The patient then returned emergently evaluated couple days ago. He refused to answer any questions and he was belligerent and abusive to the staff.   Final diagnoses:  Tachycardia  Leukocytosis    1527  the patient is more cooperative at this point.  He states he honestly does not recall what happened earlier. He does admit that he uses drugs but is not sure if he used any today. Patient is persistently tachycardic. He does have a leukocytosis although no lactic acidosis..  Empiric antibiotics were started.  Chest x-ray suggests the possibility of a pneumonia versus atelectasis. Considering his elevated white blood cell count and persistent tachycardia will consult with the  medical service regarding admission for further treatment and monitoring.    Dorie Rank, MD 01/18/14 [phone]

## 2014-01-18 NOTE — ED Notes (Signed)
Pt cursing and uncooperative in answering nursing admission history questions. Nursing admission history was forwarded from 12/31/13. Lucius Conn BSN, RN-BC Admissions RN  01/18/2014 1:36 PM

## 2014-01-18 NOTE — ED Notes (Signed)
Pt given graham crackers and peanut butter. Pt said thank you, then got annoyed at nurse again for putting EKG leads back on him. Pt then says "look here they come, they are all right there". rn did not see anyone in the hallway except for 1 pt who was laying in a bed resting.

## 2014-01-18 NOTE — ED Notes (Signed)
Pt reports rn was "second best, because he was the best" minutes later being non complaint. Pt urinated in bed, and then told rn she was annoying him by asking for a urine sample.pt stated he was going to fill up the urinal and then throw it down the hall.  rn changed pt bedsheets. Pt then took off all wire and monitoring. Pt washed his hands 5 times. Pt still refusing to sit down in bed. Pt told that he could have water when he sat down and let rn hook back the monitors. Pt refusing. Standing at sink.

## 2014-01-18 NOTE — ED Notes (Signed)
Off duty officer reported that pt is wanted in another state. When pt is up for discharged contact [employer].

## 2014-01-18 NOTE — ED Notes (Signed)
Pt reported in front of rn and radiology staff that he did do drugs today, pt did not go into further detail. Pt is much friendly at this time.  Earlier pt was yelling and cursing at nurse, threatening that if it was not a felony he would be tempted to hit staff. Pt now is friendly and told this rn that she "was the best".

## 2014-01-18 NOTE — ED Notes (Addendum)
Pt attempting to urinate  Pt has red marks to left thigh and red area behind right knee

## 2014-01-18 NOTE — ED Notes (Signed)
Per ems [employer] was called out twice this morning. Neighbor called bc pt was outside screaming. ems found pt in a parking lot with his right leg between a concrete wall and fence. Pt not being cooperative and straight foward.

## 2014-01-18 NOTE — ED Notes (Signed)
Per md pt allowed to have PO fluids 

## 2014-01-18 NOTE — Progress Notes (Signed)
  CARE MANAGEMENT ED NOTE 01/18/2014  Patient:  [facility], Ralph Chavez   Account Number:  [id number]  Date Initiated:  01/18/2014  Documentation initiated by:  Jackelyn Poling  Subjective/Objective Assessment:   43 yr old self pay [employer] ([place]) pt [employer] was called out twice this morning. Neighbor called bc pt was outside screaming. ems found pt in a parking lot with his right leg between a concrete wall and fence. Pt not being     Subjective/Objective Assessment Detail:   cooperative and straight foward.  This CM spoke with pt on 12/31/13 about pcps and re admission f/u  Refer to 12/31/13 notes He then informed cm he was told to come back to see the discharging hospitalist which is not a standard of practice    Pt joked with CM about having an orange card that he does not really have  There is a scheduled appt for pt on 02/01/14 at [facility] Dr Chesley Mires per dr Halford Chessman request noted in [facility]     Action/Plan:   ED CM spoke with pt about pcps for f/u care Provided Self pay [facility] resources Encouraged f/u to obtain a self pay pcp for f/u services   Action/Plan Detail:   Anticipated DC Date:       Status Recommendation to Physician:   Result of Recommendation:    Other ED [place]  Other  PCP issues  Outpatient Services - Pt will follow up    Choice offered to / List presented to:            Status of service:  Completed, signed off  ED Comments:   ED Comments Detail:  CM spoke with pt who confirms self pay [facility] resident with no pcp. CM discussed and provided written information for self pay pcps, importance of pcp for f/u care, www.needymeds.org, discounted pharmacies and other [employer] such as [employer], affordable care act,  financial assistance, DSS and  health department Reviewed resources for [employer] self pay pcps like [facility], family medicine at [address], [facility]  family practice, general medical clinics, [employer] urgent care plus others, medication resources, [facility] out patient pharmacies and housing Pt voiced understanding and appreciation of resources provided

## 2014-01-18 NOTE — ED Notes (Signed)
Bed: LN98 Expected date:  Expected time:  Means of arrival:  Comments: EMS-tachy

## 2014-01-18 NOTE — Progress Notes (Signed)
ANTIBIOTIC CONSULT NOTE - INITIAL  Pharmacy Consult for Vancomycin & Zosyn Indication: rule out sepsis  No Known Allergies  Patient Measurements:   Total body weight: 95.3kg  Vital Signs: Temp: 98.2 F (36.8 C) (09/04 1245) Temp src: Oral (09/04 1245) BP: 122/78 mmHg (09/04 1250) Pulse Rate: 139 (09/04 1255) Intake/Output from previous day:   Intake/Output from this shift:    Labs: No results found for this basename: WBC, HGB, PLT, LABCREA, CREATININE,  in the last 72 hours The CrCl is unknown because both a height and weight (above a minimum accepted value) are required for this calculation. No results found for this basename: VANCOTROUGH, Corlis Leak, VANCORANDOM, GENTTROUGH, GENTPEAK, GENTRANDOM, TOBRATROUGH, TOBRAPEAK, TOBRARND, AMIKACINPEAK, AMIKACINTROU, AMIKACIN,  in the last 72 hours   Microbiology: Recent Results (from the past 720 hour(s))  MRSA PCR SCREENING     Status: None   Collection Time    12/20/13  7:56 AM      Result Value Ref Range Status   MRSA by PCR NEGATIVE  NEGATIVE Final   Comment:            The GeneXpert MRSA Assay (FDA     approved for NASAL specimens     only), is one component of a     comprehensive MRSA colonization     surveillance program. It is not     intended to diagnose MRSA     infection nor to guide or     monitor treatment for     MRSA infections.  CULTURE, EXPECTORATED SPUTUM-ASSESSMENT     Status: None   Collection Time    12/21/13  9:15 AM      Result Value Ref Range Status   Specimen Description SPUTUM   Final   Special Requests NONE   Final   Sputum evaluation     Final   Value: MICROSCOPIC FINDINGS SUGGEST THAT THIS SPECIMEN IS NOT REPRESENTATIVE OF LOWER RESPIRATORY SECRETIONS. PLEASE RECOLLECT.     Gram Stain Report Called to,Read Back By and Verified With: [place], M AT 0942 ON 12/21/13 BY HOBBINS, J.   Report Status 12/21/2013 FINAL   Final  CULTURE, BLOOD (ROUTINE X 2)     Status: None   Collection Time    12/31/13   6:30 PM      Result Value Ref Range Status   Specimen Description BLOOD LEFT WRIST   Final   Special Requests BOTTLES DRAWN AEROBIC AND ANAEROBIC 6 ML   Final   Culture  Setup Time     Final   Value: 12/31/2013 22:37     Performed at [facility]   Culture     Final   Value: NO GROWTH 5 DAYS     Performed at [facility]   Report Status 01/06/2014 FINAL   Final  CULTURE, BLOOD (ROUTINE X 2)     Status: None   Collection Time    12/31/13  6:40 PM      Result Value Ref Range Status   Specimen Description BLOOD RIGHT HAND   Final   Special Requests BOTTLES DRAWN AEROBIC AND ANAEROBIC 4 ML   Final   Culture  Setup Time     Final   Value: 12/31/2013 22:38     Performed at [facility]   Culture     Final   Value: NO GROWTH 5 DAYS     Performed at [facility]   Report Status 01/06/2014 FINAL   Final  MRSA PCR SCREENING     Status: None   Collection Time    01/01/14  1:51 AM      Result Value Ref Range Status   MRSA by PCR NEGATIVE  NEGATIVE Final   Comment:            The GeneXpert MRSA Assay (FDA     approved for NASAL specimens     only), is one component of a     comprehensive MRSA colonization     surveillance program. It is not     intended to diagnose MRSA     infection nor to guide or     monitor treatment for     MRSA infections.  GRAM STAIN     Status: None   Collection Time    01/01/14 10:05 AM      Result Value Ref Range Status   Specimen Description PLEURAL   Final   Special Requests NONE   Final   Gram Stain     Final   Value: CYTOSPIN     WBC PRESENT,BOTH PMN AND MONONUCLEAR     NO ORGANISMS SEEN     Gram Stain Report Called to,Read Back By and Verified With: A. ARNOLD RN AT 1050 ON 08.18.15 BY SHUEA   Report Status 01/01/2014 FINAL   Final  BODY FLUID CULTURE     Status: None   Collection Time    01/01/14 10:05 AM      Result Value Ref Range Status   Specimen Description PLEURAL   Final   Special Requests Normal    Final   Gram Stain     Final   Value: CYTOSPIN SLIDE WBC PRESENT,BOTH PMN AND MONONUCLEAR     NO ORGANISMS SEEN     Gram Stain Report Called to,Read Back By and Verified With: Gram Stain Report Called to,Read Back By and Verified With: A ARNOLD RN 1050AM 01/01/14 BY SHUEA Performed at [facility]     Performed at [facility]   Culture     Final   Value: NO GROWTH 3 DAYS     Performed at [facility]   Report Status 01/04/2014 FINAL   Final  AFB CULTURE WITH SMEAR     Status: None   Collection Time    01/04/14  9:52 AM      Result Value Ref Range Status   Specimen Description PLEURAL   Final   Special Requests Normal   Final   Acid Fast Smear     Final   Value: NO ACID FAST BACILLI SEEN     Performed at [facility]   Culture     Final   Value: CULTURE WILL BE EXAMINED FOR 6 WEEKS BEFORE ISSUING A FINAL REPORT     Performed at [facility]   Report Status PENDING   Incomplete  BODY FLUID CULTURE     Status: None   Collection Time    01/04/14  9:52 AM      Result Value Ref Range Status   Specimen Description PLEURAL   Final   Special Requests Normal   Final   Gram Stain     Final   Value: RARE WBC PRESENT, PREDOMINANTLY PMN     NO ORGANISMS SEEN     Performed at [facility]   Culture     Final   Value: NO GROWTH 3 DAYS     Performed at [facility]  Report Status 01/07/2014 FINAL   Final  FUNGUS CULTURE W SMEAR     Status: None   Collection Time    01/04/14  9:52 AM      Result Value Ref Range Status   Specimen Description PLEURAL   Final   Special Requests NONE   Final   Fungal Smear     Final   Value: NO YEAST OR FUNGAL ELEMENTS SEEN     Performed at [facility]   Culture     Final   Value: CULTURE IN PROGRESS FOR FOUR WEEKS     Performed at [facility]   Report Status PENDING   Incomplete   Medical History: Past Medical History  Diagnosis Date  . Anxiety   . Ulcer    Medications:   Scheduled:   Anti-infectives   Start     Dose/Rate Route Frequency Ordered Stop   01/18/14 1400  vancomycin (VANCOCIN) 2,000 mg in sodium chloride 0.9 % 500 mL IVPB     2,000 mg 250 mL/hr over 120 Minutes Intravenous  Once 01/18/14 1306     01/18/14 1300  piperacillin-tazobactam (ZOSYN) IVPB 3.375 g     3.375 g 100 mL/hr over 30 Minutes Intravenous  Once 01/18/14 1300     01/18/14 1300  vancomycin (VANCOCIN) IVPB 1000 mg/200 mL premix  Status:  Discontinued     1,000 mg 200 mL/hr over 60 Minutes Intravenous  Once 01/18/14 1300 01/18/14 1306     Assessment: 43yoM to ED via EMS, found in parking lot with right leg between concrete wall and fence. Hx of ETOH, Cocaine, Heroin use, UDS pending and labs pending. Tachycardia noted by EMS, Vancomycin and Zosyn ordered x1 in ED, and pharmacy to dose, rule out sepsis.  Recent Vancomycin dosing at 1gm q12 delivered subtherapeutic trough  Goal of Therapy:  Vancomycin trough level 15-20 mcg/ml  Plan:   Vancomycin 2gm x1, then 1gm q8hr  Zosyn 3.375gm q8h-4 hr infusion  Vancomycin trough prior to 5th dose with q8hr dosing  Narrow abx as appropriate  Minda Ditto PharmD Pager [phone] 01/18/2014, 1:45 PM

## 2014-01-18 NOTE — H&P (Signed)
History and Physical  Ralph Chavez HFW:[id number] DOB: Sep 29, 1970 DOA: 01/18/2014  Referring physician: Dorie Rank, ER Physician PCP: No PCP Per Patient   Chief Complaint: Confusion  HPI: Ralph Chavez is a 43 y.o. male  Past medical history of alcohol and polysubstance abuse who several weeks ago had been abated to the hospitalist service for a pneumonia complicated by a parapneumonic effusion requiring a chest tube for a period of time. The patient had left AGAINST MEDICAL ADVICE during the hospitalization. At that time, he indicated that he would be going to another hospital, although it is unclear if he did so. Do not hospitalization, his urine drug screen was positive for benzos and cocaine.  History obtained from ER physician and paramedic records. Today, [employer] was called by patient's neighbor the patient was outside confused and screaming. The second time, patient was found his legs stopped at a concrete wall and a fence.  When patient was brought into the emergency room by paramedics, he was quite confused. He would not answer questions. He was quite disruptive. He seemed quite agitated. Lab work was done and was found to have a questionable infiltrate on chest x-ray in the right lobe, a white blood cell count of 20 with an anion gap of 21.  He was also noted to be in mild ARF, w/crt of 1.48. (baseline normal).  A urine drug screen done was positive for benzos. A lactic acid level was checked and found to be in the high end of normal at 2.2-noted venous sample Because of concerns for acute sepsis and patient was quite delirious, it was felt that he needed to be admitted. Hospitalists were called for further evaluation.   Review of Systems:  Patient seen in emergency room. Pt complains of just not feeling right. I tried to go through detailed review of systems asking about chest pain or shortness of breath, he was remaining evasive and not wanting to answer to any  questions.  Past Medical History  Diagnosis Date  . Anxiety   . Ulcer    History reviewed. No pertinent past surgical history. Social History:  reports that he has been smoking Cigarettes.  He has been smoking about 0.00 packs per day. He has never used smokeless tobacco. He reports that he drinks alcohol. He reports that he uses illicit drugs (Cocaine and Heroin). Patient lives at home with his mother & is able to participate in activities of daily living without assistance  No Known Allergies  Family history: Patient will not tell me about any medical problems in the family  Prior to Admission medications   Medication Sig Start Date End Date Taking? Authorizing Provider  amLODipine (NORVASC) 5 MG tablet Take 1 tablet (5 mg total) by mouth daily. 12/25/13   Verlee Monte, MD  naproxen sodium (ANAPROX) 220 MG tablet Take 440 mg by mouth 2 (two) times daily as needed (pain).    Historical Provider, MD  omeprazole (PRILOSEC) 20 MG capsule Take 20 mg by mouth daily.    Historical Provider, MD  oxyCODONE-acetaminophen (ROXICET) 5-325 MG per tablet Take 1 tablet by mouth every 6 (six) hours as needed. 12/25/13   Verlee Monte, MD    Physical Exam: BP 125/74  Pulse 117  Temp(Src) 98.1 F (36.7 C) (Oral)  Resp 21  SpO2 95%  General:  Interactive, oriented x1 Eyes: Sclera nonicteric, extraocular movements appear to be intact ENT: Normocephalic, atraumatic, mucous members are dry Neck: No visible JVD Cardiovascular: Tachycardic, no murmurs, sinus  rhythm Respiratory: Clear to auscultation bilaterally Abdomen: Soft, nontender, nondistended, positive bowel sounds Skin: Patient looks slightly disheveled, but no visible skin breaks tears or lesions. Nursing come in and on some red scratches on the posterior aspect of one of his legs. I did not witness this, but again patient was constantly moving Musculoskeletal: No clubbing or cyanosis or edema Psychiatric: Patient is noncooperative, easily  agitated Neurologic: No focal deficits although the patient we'll not sit still or comply for exam           Labs on Admission:  Basic Metabolic Panel:  Recent Labs Lab 01/12/14 0013 01/18/14 1306  NA 141 139  K 3.8 3.4*  CL 102 97  CO2 28 21  GLUCOSE 112* 185*  BUN 10 15  CREATININE 0.73 1.48*  CALCIUM 9.5 10.6*   Liver Function Tests:  Recent Labs Lab 01/18/14 1306  AST 25  ALT 23  ALKPHOS 100  BILITOT 0.7  PROT 8.8*  ALBUMIN 3.6   CBC:  Recent Labs Lab 01/12/14 0013 01/18/14 1306  WBC 12.0* 20.7*  NEUTROABS  --  18.3*  HGB 10.5* 13.4  HCT 32.4* 39.3  MCV 90.3 88.3  PLT 642* 610*    BNP (last 3 results)  Recent Labs  12/24/13 0409  PROBNP 538.5*   CBG:  Recent Labs Lab 01/18/14 1243  GLUCAP 192*    Radiological Exams on Admission:    EKG: Independently reviewed. Age indeterminate inferior infarct, sinus tachycardia with prolonged QT of 408  Assessment/Plan Present on Admission:  . Polysubstance abuse: Noted urine drug screen positive for benzos, also suspect alcohol withdrawal  . Tachycardia: Suspect secondary to dehydration plus substance abuse  . Sepsis: Certainly concerning given markedly to psychosis, altered mental status, pneumonia as source. Patient looks clinically dehydrated IV fluids plus broad-spectrum IV antibiotics. Treat for presumed hospital-acquired pneumonia. No signs of urinary tract infection. No signs of cellulitis, at least visibly  . Acute encephalopathy: In part from sepsis plus suspected drug use. Also still suspect patient may be in alcohol withdrawal. Have placed him on CIWA protocol was scheduled and when necessary Ativan  . Right lower lobe pneumonia: As above. Infiltrate questionable seen on x-ray. Given recent hospitalization we'll treat his healthcare associated pneumonia.  . Hyperglycemia: Blood sugar to 180s. No history of diabetes. We'll check A1 C.  . Metabolic acidosis: Suspect secondary to ethanol,  monitor closely.  Acute renal failure: Mild dehydration presumed secondary to pneumonia. IV fluids Consultants: None  Code Status: Full code  Family Communication: Not able to reach patient's mother   Disposition Plan: Hopefully will be able to improve patient's mentation, get him through withdrawal plus treat underlying sepsis. Likely here for at least several days  Time spent: 45 minutes  [facility] Pager [phone]  **Disclaimer: This note may have been dictated with voice recognition software. Similar sounding words can inadvertently be transcribed and this note may contain transcription errors which may not have been corrected upon publication of note.**

## 2014-01-18 NOTE — ED Notes (Signed)
Report given to Jessica, RN.

## 2014-01-18 NOTE — ED Notes (Signed)
Pt taken upstairs. Pt asked what state and city he was in. Pt would not get off ED bed to get onto floor bed.

## 2014-01-19 DIAGNOSIS — N179 Acute kidney failure, unspecified: Secondary | ICD-10-CM

## 2014-01-19 DIAGNOSIS — F1994 Other psychoactive substance use, unspecified with psychoactive substance-induced mood disorder: Secondary | ICD-10-CM

## 2014-01-19 DIAGNOSIS — A419 Sepsis, unspecified organism: Principal | ICD-10-CM

## 2014-01-19 DIAGNOSIS — F29 Unspecified psychosis not due to a substance or known physiological condition: Secondary | ICD-10-CM

## 2014-01-19 LAB — BASIC METABOLIC PANEL
Anion gap: 14 (ref 5–15)
BUN: 13 mg/dL (ref 6–23)
CALCIUM: 9.2 mg/dL (ref 8.4–10.5)
CHLORIDE: 104 meq/L (ref 96–112)
CO2: 22 meq/L (ref 19–32)
Creatinine, Ser: 1.21 mg/dL (ref 0.50–1.35)
GFR calc Af Amer: 85 mL/min — ABNORMAL LOW (ref >90)
GFR calc non Af Amer: 73 mL/min — ABNORMAL LOW (ref >90)
GLUCOSE: 99 mg/dL (ref 70–99)
Potassium: 3.3 mEq/L — ABNORMAL LOW (ref 3.7–5.3)
SODIUM: 140 meq/L (ref 137–147)

## 2014-01-19 LAB — URINE CULTURE
CULTURE: NO GROWTH
Colony Count: NO GROWTH

## 2014-01-19 LAB — CBC
HCT: 33.1 % — ABNORMAL LOW (ref 39.0–52.0)
Hemoglobin: 10.9 g/dL — ABNORMAL LOW (ref 13.0–17.0)
MCH: 29.1 pg (ref 26.0–34.0)
MCHC: 32.9 g/dL (ref 30.0–36.0)
MCV: 88.3 fL (ref 78.0–100.0)
Platelets: 417 10*3/uL — ABNORMAL HIGH (ref 150–400)
RBC: 3.75 MIL/uL — AB (ref 4.22–5.81)
RDW: 15 % (ref 11.5–15.5)
WBC: 14.6 10*3/uL — ABNORMAL HIGH (ref 4.0–10.5)

## 2014-01-19 LAB — LEGIONELLA ANTIGEN, URINE: Legionella Antigen, Urine: NEGATIVE

## 2014-01-19 LAB — HIV ANTIBODY (ROUTINE TESTING W REFLEX): HIV 1&2 Ab, 4th Generation: NONREACTIVE

## 2014-01-19 LAB — STREP PNEUMONIAE URINARY ANTIGEN: Strep Pneumo Urinary Antigen: NEGATIVE

## 2014-01-19 MED ORDER — HALOPERIDOL LACTATE 5 MG/ML IJ SOLN
5.0000 mg | Freq: Four times a day (QID) | INTRAMUSCULAR | Status: DC | PRN
  Administered 2014-01-19 – 2014-01-21 (×2): 5 mg via INTRAVENOUS
  Filled 2014-01-19 (×3): qty 1

## 2014-01-19 MED ORDER — POTASSIUM CHLORIDE CRYS ER 20 MEQ PO TBCR
20.0000 meq | EXTENDED_RELEASE_TABLET | Freq: Once | ORAL | Status: AC
  Administered 2014-01-19: 20 meq via ORAL
  Filled 2014-01-19: qty 1

## 2014-01-19 MED ORDER — SODIUM CHLORIDE 0.9 % IV SOLN
INTRAVENOUS | Status: DC
  Administered 2014-01-19 – 2014-01-25 (×6): via INTRAVENOUS
  Filled 2014-01-19 (×14): qty 1000

## 2014-01-19 MED ORDER — HALOPERIDOL 5 MG PO TABS
5.0000 mg | ORAL_TABLET | Freq: Three times a day (TID) | ORAL | Status: DC
  Administered 2014-01-19 – 2014-01-20 (×2): 5 mg via ORAL
  Filled 2014-01-19 (×5): qty 1

## 2014-01-19 NOTE — Consult Note (Signed)
[facility] Consult   Reason for Consult:  Confusion, substance induced psychosis Referring Physician:  Dr.[name] is an 43 y.o. male. Total Time spent with patient: 30 minutes  Assessment: AXIS I:  Psychotic Disorder NOS, Substance Abuse and Substance Induced Mood Disorder AXIS II:  Deferred AXIS III:   Past Medical History  Diagnosis Date  . Anxiety   . Ulcer    AXIS IV:  problems related to social environment AXIS V:  31-40 impairment in reality testing  Plan:  Patient does not meet criteria for psychiatric inpatient admission.  Subjective:   Ralph Chavez is a 43 y.o. male patient admitted with  Empyema, confusion, substance abuse.  HPI:  Pt is a 43 year old white male who lives alone in [place]. He was recently admitted but left AMA while being treated for empyema. His girlfriend Joellen Jersey is here and reports he has been belligerent and confused recently. He has been paranoid and talking to himself . On day of admission, he refused to leave a car and spent the night on the streets. He admits to recent use of cocaine and crystal meth 3 days ago. Drug screen only positive for benzodiazepines.Denies use of alcohol He has prior history of substance abuse treatment and living in halfway house 2 years ago. According to girlfriend he relapsed this summer. She states he is usually not this angry or confused HPI Elements:   Location:  global. Quality:  severe. Severity:  severe. Timing:  3 days. Duration:  several years. Context:  sepsis, drug use.  Past Psychiatric History: Past Medical History  Diagnosis Date  . Anxiety   . Ulcer     reports that he has been smoking Cigarettes.  He has been smoking about 0.00 packs per day. He has never used smokeless tobacco. He reports that he drinks alcohol. He reports that he uses illicit drugs (Cocaine and Heroin). History reviewed. No pertinent family history.   Living Arrangements: Other (Comment) (notes in  computer state pt is staying at possible drug house, pt states he is homeless)   Abuse/Neglect [facility]) Physical Abuse: Denies Verbal Abuse: Denies Sexual Abuse: Denies Allergies:  No Known Allergies   Objective: Blood pressure 138/86, pulse 111, temperature 98.1 F (36.7 C), temperature source Oral, resp. rate 20, height 5' 10"  (1.778 m), weight 199 lb 4.7 oz (90.4 kg), SpO2 96.00%.Body mass index is 28.6 kg/(m^2). Results for orders placed during the hospital encounter of 01/18/14 (from the past 72 hour(s))  CBG MONITORING, ED     Status: Abnormal   Collection Time    01/18/14 12:43 PM      Result Value Ref Range   Glucose-Capillary 192 (*) 70 - 99 mg/dL  CULTURE, BLOOD (ROUTINE X 2)     Status: None   Collection Time    01/18/14  1:04 PM      Result Value Ref Range   Specimen Description BLOOD RIGHT ANTECUBITAL     Special Requests BOTTLES DRAWN AEROBIC AND ANAEROBIC 5ML     Culture  Setup Time       Value: 01/18/2014 20:45     Performed at [facility]   Culture       Value:        BLOOD CULTURE RECEIVED NO GROWTH TO DATE CULTURE WILL BE HELD FOR 5 DAYS BEFORE ISSUING A FINAL NEGATIVE REPORT     Performed at [facility]   Report Status PENDING    CBC WITH DIFFERENTIAL  Status: Abnormal   Collection Time    01/18/14  1:06 PM      Result Value Ref Range   WBC 20.7 (*) 4.0 - 10.5 K/uL   RBC 4.45  4.22 - 5.81 MIL/uL   Hemoglobin 13.4  13.0 - 17.0 g/dL   HCT 39.3  39.0 - 52.0 %   MCV 88.3  78.0 - 100.0 fL   MCH 30.1  26.0 - 34.0 pg   MCHC 34.1  30.0 - 36.0 g/dL   RDW 14.8  11.5 - 15.5 %   Platelets 610 (*) 150 - 400 K/uL   Neutrophils Relative % 88 (*) 43 - 77 %   Neutro Abs 18.3 (*) 1.7 - 7.7 K/uL   Lymphocytes Relative 6 (*) 12 - 46 %   Lymphs Abs 1.2  0.7 - 4.0 K/uL   Monocytes Relative 5  3 - 12 %   Monocytes Absolute 1.0  0.1 - 1.0 K/uL   Eosinophils Relative 1  0 - 5 %   Eosinophils Absolute 0.2  0.0 - 0.7 K/uL   Basophils Relative 0  0 - 1 %    Basophils Absolute 0.1  0.0 - 0.1 K/uL  COMPREHENSIVE METABOLIC PANEL     Status: Abnormal   Collection Time    01/18/14  1:06 PM      Result Value Ref Range   Sodium 139  137 - 147 mEq/L   Potassium 3.4 (*) 3.7 - 5.3 mEq/L   Chloride 97  96 - 112 mEq/L   CO2 21  19 - 32 mEq/L   Glucose, Bld 185 (*) 70 - 99 mg/dL   BUN 15  6 - 23 mg/dL   Creatinine, Ser 1.48 (*) 0.50 - 1.35 mg/dL   Calcium 10.6 (*) 8.4 - 10.5 mg/dL   Total Protein 8.8 (*) 6.0 - 8.3 g/dL   Albumin 3.6  3.5 - 5.2 g/dL   AST 25  0 - 37 U/L   Comment: SLIGHT HEMOLYSIS     HEMOLYSIS AT THIS LEVEL MAY AFFECT RESULT   ALT 23  0 - 53 U/L   Alkaline Phosphatase 100  39 - 117 U/L   Total Bilirubin 0.7  0.3 - 1.2 mg/dL   GFR calc non Af Amer 57 (*) >90 mL/min   GFR calc Af Amer 66 (*) >90 mL/min   Comment: (NOTE)     The eGFR has been calculated using the CKD EPI equation.     This calculation has not been validated in all clinical situations.     eGFR's persistently <90 mL/min signify possible Chronic Kidney     Disease.   Anion gap 21 (*) 5 - 15  ETHANOL     Status: None   Collection Time    01/18/14  1:06 PM      Result Value Ref Range   Alcohol, Ethyl (B) <11  0 - 11 mg/dL   Comment:            LOWEST DETECTABLE LIMIT FOR     SERUM ALCOHOL IS 11 mg/dL     FOR MEDICAL PURPOSES ONLY  SALICYLATE LEVEL     Status: Abnormal   Collection Time    01/18/14  1:06 PM      Result Value Ref Range   Salicylate Lvl <9.7 (*) 2.8 - 20.0 mg/dL  ACETAMINOPHEN LEVEL     Status: None   Collection Time    01/18/14  1:06 PM      Result  Value Ref Range   Acetaminophen (Tylenol), Serum <15.0  10 - 30 ug/mL   Comment:            THERAPEUTIC CONCENTRATIONS VARY     SIGNIFICANTLY. A RANGE OF 10-30     ug/mL MAY BE AN EFFECTIVE     CONCENTRATION FOR MANY PATIENTS.     HOWEVER, SOME ARE BEST TREATED     AT CONCENTRATIONS OUTSIDE THIS     RANGE.     ACETAMINOPHEN CONCENTRATIONS     >150 ug/mL AT 4 HOURS AFTER     INGESTION  AND >50 ug/mL AT 12     HOURS AFTER INGESTION ARE     OFTEN ASSOCIATED WITH TOXIC     REACTIONS.  T4, FREE     Status: None   Collection Time    01/18/14  1:06 PM      Result Value Ref Range   Free T4 1.69  0.80 - 1.80 ng/dL   Comment: Performed at [facility], BLOOD (ROUTINE X 2)     Status: None   Collection Time    01/18/14  1:07 PM      Result Value Ref Range   Specimen Description BLOOD RIGHT ARM     Special Requests BOTTLES DRAWN AEROBIC ONLY 1ML     Culture  Setup Time       Value: 01/18/2014 20:45     Performed at [facility]   Culture       Value:        BLOOD CULTURE RECEIVED NO GROWTH TO DATE CULTURE WILL BE HELD FOR 5 DAYS BEFORE ISSUING A FINAL NEGATIVE REPORT     Performed at [facility]   Report Status PENDING    Randolm Idol, ED     Status: None   Collection Time    01/18/14  1:07 PM      Result Value Ref Range   Troponin i, poc 0.00  0.00 - 0.08 ng/mL   Comment 3            Comment: Due to the release kinetics of cTnI,     a negative result within the first hours     of the onset of symptoms does not rule out     myocardial infarction with certainty.     If myocardial infarction is still suspected,     repeat the test at appropriate intervals.  TSH     Status: None   Collection Time    01/18/14  1:07 PM      Result Value Ref Range   TSH 0.371  0.350 - 4.500 uIU/mL   Comment: Performed at [facility], ED     Status: None   Collection Time    01/18/14  1:10 PM      Result Value Ref Range   Lactic Acid, Venous 2.20  0.5 - 2.2 mmol/L  URINALYSIS, ROUTINE W REFLEX MICROSCOPIC     Status: Abnormal   Collection Time    01/18/14  3:43 PM      Result Value Ref Range   Color, Urine YELLOW  YELLOW   APPearance CLOUDY (*) CLEAR   Specific Gravity, Urine 1.016  1.005 - 1.030   pH 6.5  5.0 - 8.0   Glucose, UA NEGATIVE  NEGATIVE mg/dL   Hgb urine dipstick TRACE (*) NEGATIVE   Bilirubin  Urine NEGATIVE  NEGATIVE   Ketones, ur NEGATIVE  NEGATIVE mg/dL   Protein, ur 30 (*) NEGATIVE mg/dL   Urobilinogen, UA 1.0  0.0 - 1.0 mg/dL   Nitrite NEGATIVE  NEGATIVE   Leukocytes, UA NEGATIVE  NEGATIVE  URINE RAPID DRUG SCREEN (HOSP PERFORMED)     Status: Abnormal   Collection Time    01/18/14  3:43 PM      Result Value Ref Range   Opiates NONE DETECTED  NONE DETECTED   Cocaine NONE DETECTED  NONE DETECTED   Benzodiazepines POSITIVE (*) NONE DETECTED   Amphetamines NONE DETECTED  NONE DETECTED   Tetrahydrocannabinol NONE DETECTED  NONE DETECTED   Barbiturates NONE DETECTED  NONE DETECTED   Comment:            DRUG SCREEN FOR MEDICAL PURPOSES     ONLY.  IF CONFIRMATION IS NEEDED     FOR ANY PURPOSE, NOTIFY LAB     WITHIN 5 DAYS.                LOWEST DETECTABLE LIMITS     FOR URINE DRUG SCREEN     Drug Class       Cutoff (ng/mL)     Amphetamine      1000     Barbiturate      200     Benzodiazepine   315     Tricyclics       176     Opiates          300     Cocaine          300     THC              50  URINE MICROSCOPIC-ADD ON     Status: None   Collection Time    01/18/14  3:43 PM      Result Value Ref Range   Squamous Epithelial / LPF RARE  RARE   WBC, UA 0-2  <3 WBC/hpf   RBC / HPF 3-6  <3 RBC/hpf   Bacteria, UA RARE  RARE  HIV ANTIBODY (ROUTINE TESTING)     Status: None   Collection Time    01/18/14  5:54 PM      Result Value Ref Range   HIV 1&2 Ab, 4th Generation NONREACTIVE  NONREACTIVE   Comment: (NOTE)     A NONREACTIVE HIV Ag/Ab result does not exclude HIV infection since     the time frame for seroconversion is variable. If acute HIV infection     is suspected, a HIV-1 RNA Qualitative TMA test is recommended.     HIV-1/2 Antibody Diff         Not indicated.     HIV-1 RNA, Qual TMA           Not indicated.     PLEASE NOTE: This information has been disclosed to you from records     whose confidentiality may be protected by state law. If your state      requires such protection, then the state law prohibits you from making     any further disclosure of the information without the specific written     consent of the person to whom it pertains, or as otherwise permitted     by law. A general authorization for the release of medical or other     information is NOT sufficient for this purpose.     The performance of this assay has not been clinically validated in  patients less than 1 years old.     Performed at [facility]     Status: Abnormal   Collection Time    01/18/14  5:54 PM      Result Value Ref Range   Hemoglobin A1C 5.8 (*) <5.7 %   Comment: (NOTE)                                                                               According to the ADA Clinical Practice Recommendations for 2011, when     HbA1c is used as a screening test:      >=6.5%   Diagnostic of Diabetes Mellitus               (if abnormal result is confirmed)     5.7-6.4%   Increased risk of developing Diabetes Mellitus     References:Diagnosis and Classification of Diabetes Mellitus,Diabetes     [id number],[id number](SJGGE 1):S62-S69 and Standards of Medical Care in             Diabetes - 2011,Diabetes [id number] (Suppl 1):S11-S61.   Mean Plasma Glucose 120 (*) <117 mg/dL   Comment: Performed at [facility]     Status: None   Collection Time    01/18/14  6:40 PM      Result Value Ref Range   Strep Pneumo Urinary Antigen NEGATIVE  NEGATIVE   Comment:            Infection due to S. pneumoniae     cannot be absolutely ruled out     since the antigen present     may be below the detection limit     of the test.     Performed at [place] [facility]     Status: Abnormal   Collection Time    01/19/14  4:18 AM      Result Value Ref Range   Sodium 140  137 - 147 mEq/L   Potassium 3.3 (*) 3.7 - 5.3 mEq/L   Chloride 104  96 - 112 mEq/L   CO2 22  19 - 32 mEq/L   Glucose, Bld  99  70 - 99 mg/dL   BUN 13  6 - 23 mg/dL   Creatinine, Ser 1.21  0.50 - 1.35 mg/dL   Calcium 9.2  8.4 - 10.5 mg/dL   GFR calc non Af Amer 73 (*) >90 mL/min   GFR calc Af Amer 85 (*) >90 mL/min   Comment: (NOTE)     The eGFR has been calculated using the CKD EPI equation.     This calculation has not been validated in all clinical situations.     eGFR's persistently <90 mL/min signify possible Chronic Kidney     Disease.   Anion gap 14  5 - 15  CBC     Status: Abnormal   Collection Time    01/19/14  4:18 AM      Result Value Ref Range   WBC 14.6 (*) 4.0 - 10.5 K/uL   RBC 3.75 (*) 4.22 - 5.81 MIL/uL   Hemoglobin 10.9 (*) 13.0 - 17.0 g/dL   Comment:  DELTA CHECK NOTED     REPEATED TO VERIFY   HCT 33.1 (*) 39.0 - 52.0 %   MCV 88.3  78.0 - 100.0 fL   MCH 29.1  26.0 - 34.0 pg   MCHC 32.9  30.0 - 36.0 g/dL   RDW 15.0  11.5 - 15.5 %   Platelets 417 (*) 150 - 400 K/uL   Comment: DELTA CHECK NOTED     REPEATED TO VERIFY   Labs are reviewed and are pertinent for elevated wbc  Current Facility-Administered Medications  Medication Dose Route Frequency Provider Last Rate Last Dose  . acetaminophen (TYLENOL) tablet 650 mg  650 mg Oral Q6H PRN Annita Brod, MD       Or  . acetaminophen (TYLENOL) suppository 650 mg  650 mg Rectal Q6H PRN Annita Brod, MD      . amLODipine (NORVASC) tablet 5 mg  5 mg Oral Daily Annita Brod, MD   5 mg at 01/19/14 0955  . enoxaparin (LOVENOX) injection 40 mg  40 mg Subcutaneous Q24H Annita Brod, MD   40 mg at 01/18/14 2033  . folic acid (FOLVITE) tablet 1 mg  1 mg Oral Daily Annita Brod, MD   1 mg at 01/19/14 0955  . haloperidol lactate (HALDOL) injection 5 mg  5 mg Intravenous Q6H PRN Orson Eva, MD      . LORazepam (ATIVAN) injection 0-4 mg  0-4 mg Intravenous Q6H Annita Brod, MD   1 mg at 01/19/14 0443   Followed by  . [START ON 01/20/2014] LORazepam (ATIVAN) injection 0-4 mg  0-4 mg Intravenous Q12H Annita Brod, MD       . LORazepam (ATIVAN) tablet 1 mg  1 mg Oral Q6H PRN Annita Brod, MD       Or  . LORazepam (ATIVAN) injection 1 mg  1 mg Intravenous Q6H PRN Annita Brod, MD   1 mg at 01/18/14 1721  . multivitamin with minerals tablet 1 tablet  1 tablet Oral Daily Annita Brod, MD   1 tablet at 01/19/14 0955  . ondansetron (ZOFRAN) tablet 4 mg  4 mg Oral Q6H PRN Annita Brod, MD       Or  . ondansetron [facility]) injection 4 mg  4 mg Intravenous Q6H PRN Annita Brod, MD      . oxyCODONE (Oxy IR/ROXICODONE) immediate release tablet 5 mg  5 mg Oral Q4H PRN Annita Brod, MD      . piperacillin-tazobactam (ZOSYN) IVPB 3.375 g  3.375 g Intravenous 3 times per day Minda Ditto, RPH   3.375 g at 01/19/14 1312  . sodium chloride 0.9 % 1,000 mL with potassium chloride 20 mEq infusion   Intravenous Continuous Orson Eva, MD 75 mL/hr at 01/19/14 0955    . thiamine (VITAMIN B-1) tablet 100 mg  100 mg Oral Daily Annita Brod, MD   100 mg at 01/19/14 2952   Or  . thiamine (B-1) injection 100 mg  100 mg Intravenous Daily Annita Brod, MD      . vancomycin (VANCOCIN) IVPB 1000 mg/200 mL premix  1,000 mg Intravenous Q8H Minda Ditto, RPH   1,000 mg at 01/19/14 1312    Psychiatric Specialty Exam: Physical Exam  Constitutional: He appears well-developed and well-nourished.  HENT:  Head: Normocephalic.  Eyes: Pupils are equal, round, and reactive to light.  Neck: Normal range of motion. Neck supple.  Musculoskeletal: Normal range of motion.  Neurological: He is alert.  Skin: Skin is warm and dry.    Review of Systems  HENT: Negative.   Eyes: Negative.   Respiratory: Positive for wheezing.   Cardiovascular: Negative.   Gastrointestinal: Negative.   Genitourinary: Negative.   Skin: Negative.   Neurological: Positive for weakness.  Endo/Heme/Allergies: Negative.   Psychiatric/Behavioral: Positive for hallucinations and substance abuse. The patient is nervous/anxious.      Blood pressure 138/86, pulse 111, temperature 98.1 F (36.7 C), temperature source Oral, resp. rate 20, height 5' 10"  (1.778 m), weight 199 lb 4.7 oz (90.4 kg), SpO2 96.00%.Body mass index is 28.6 kg/(m^2).  General Appearance: Casual  Eye Contact::  Poor  Speech:  Garbled  Volume:  Increased  Mood:  Angry, Anxious and Irritable  Affect:  Constricted, Inappropriate and Labile  Thought Process:  Disorganized  Orientation:  Other:  not oriented to place  Thought Content:  Hallucinations: Visual and Paranoid Ideation  Suicidal Thoughts:  No  Homicidal Thoughts:  No  Memory:  Immediate;   Poor Recent;   Poor Remote;   Poor  Judgement:  Impaired  Insight:  Lacking  Psychomotor Activity:  Decreased  Concentration:  Poor  Recall:  Poor  Fund of Knowledge:Fair  Language: Fair  Akathisia:  No  Handed:  Right  AIMS (if indicated):     Assets:  Communication Skills Housing Social Support  Sleep:      Musculoskeletal: Strength & Muscle Tone: within normal limits Gait & Station: unsteady Patient leans: N/A  Treatment Plan Summary: Daily contact with patient to assess and evaluate symptoms and progress in treatment Medication management Patient's presentation of confusion, belligerence and paranoia are probably secondary to a combination of sepsis and drug abuse. Haldol would be more appropriate given as a standing order.  Will recheck patient tomorrow Levonne Spiller 01/19/2014 1:54 PM

## 2014-01-19 NOTE — Progress Notes (Addendum)
PROGRESS NOTE  Ralph Chavez PXT:[id number] DOB: [birth year] DOA: 01/18/2014 PCP: No PCP Per Patient  Interim summary 43 year old male with a history of hypertension, pneumonia, tobacco abuse, alcohol abuse and polysubstance abuse who had 2 previous admissions to the hospital in August 2015. On his admission from 12/20/2013 through 12/25/2013 the patient had an elevated troponin and underwent cardiac catheterization on 12/24/2013 which showed clean coronaries. During that hospitalization, the patient also had aspiration pneumonitis and pulmonary edema. He was discharged with 5 days of Augmentin. The patient was re-admitted on 12/31/2013 until he left [place] on 01/11/2014. During that admission, the patient had acute respiratory failure secondary to pneumonia with loculated pleural effusion, and he required placement of a right-sided chest tube on 01/01/2014 he had his pigtail catheter removed on August 23.  He also had a thoracocentesis 01/04/2014 due to residual loculated collection in the right lung that was not drained by the initial chest tube.. Because of persistent pleural fluid, another R-chest tube was placed by IR on 01/08/2014. He was treated with initially with vancomycin and cefepime. After initially 4 days of vancomycin, cefepime was continued through the hospitalization. All his cultures including blood culture and pleural fluid cultures were negative, and cytology on 01/04/2014 was negative for malignancy.  His second chest tube was ultimately discontinued on 01/11/2014 and he was transitioned to oral levofloxacin. Pulmonary followup was set up with Dr. Halford Chessman for 02/01/14@noon . The patient represented to the emergency department on 01/18/2014 because of confusion. His urine drug screen showed only benzodiazepines, and his alcohol level was negative. The patient was brought to the emergency department by the [facility] and paramedics where he was quite confused and  found to have a questionable right lower lobe infiltrate with WBC 20.7 and tachycardia. Assessment/Plan: Sepsis -Present at the time of admission -Secondary to HCAP/empyema -Continue vancomycin and Zosyn for now which were started at the time of this admission -continue IVF -adjust abx pending culture data HCAP/Empyema -The patient left AGAINST MEDICAL ADVICE on 01/11/2014 -It is unclear whether the patient took any additional antibiotics after he left the hospital -He had received cefepime from 12/31/2013 to 01/11/2014 -Continue IV antibiotics -Flutter valve -Bronchial hygiene -Bronchodilators Acute encephalopathy -Secondary to sepsis and suspected drug use -unclear what the patient's baseline is--patient was curt, belligerent and noncooperative with my interview and examination -There may be a degree of alcohol/substance withdrawal although the patient's alcohol level at the time of admission was undetectable  -alcohol withdrawal protocol -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  Am I dying? Disposition Plan:   Home when medically stable   Antibiotics:  Vancomycin 01/18/14>>>  Zosyn 01/18/14>>>        Procedures/Studies: Dg Chest 2 View  12/31/2013   CLINICAL DATA:  Pneumonia.  Recent heart attack.  Smoker  EXAM: CHEST  2 VIEW  COMPARISON:  12/24/2013  FINDINGS: The heart size appears normal. There is been significant interval decrease in aeration to  right lung which is  felt a likely represent a combination of large pleural effusion and associated atelectasis. Pulmonary vascular congestion is noted.  IMPRESSION: 1. Interval development of large right pleural effusion with associated compressive type atelectasis of the right lung.   Electronically Signed   By: Kerby Moors M.D.   On: 12/31/2013 19:17   Dg Chest 2 View  12/24/2013   CLINICAL DATA:  Evaluate for a rib fracture. Right chest pain and shortness of breath.  EXAM: CHEST  2 VIEW  COMPARISON:  12/21/2013  FINDINGS: Two views of the chest were obtained. Increased densities at the right lung base are compatible with airspace disease and pleural fluid. No evidence for a pneumothorax. Few densities at the left lung base but no significant airspace disease on the left side. There is fullness in the right hilar region which could be related to atelectasis or pleural fluid. Heart size is stable. Bony thorax is intact. Please note that dedicated rib images were not obtained.  IMPRESSION: Increased densities at the right lung base are related to airspace disease and pleural fluid.   Electronically Signed   By: Markus Daft M.D.   On: 12/24/2013 08:39   Dg Chest 2 View  12/21/2013   CLINICAL DATA:  Shortness of breath, cough, smoker  EXAM: CHEST  2 VIEW  COMPARISON:  12/20/2013  FINDINGS: Cardiomediastinal silhouette is stable. Worsening central mild interstitial prominence suspicious for mild edema or pneumonitis. No segmental infiltrate.  IMPRESSION: Worsening central mild interstitial prominence suspicious for edema or pneumonitis. No segmental infiltrate.   Electronically Signed   By: Lahoma Crocker M.D.   On: 12/21/2013 14:57   Ct Chest W Contrast  01/04/2014   CLINICAL DATA:  Pleural effusion  EXAM: CT CHEST WITH CONTRAST  TECHNIQUE: Multidetector CT imaging of the chest was performed during intravenous contrast administration.  CONTRAST:  40mL OMNIPAQUE IOHEXOL 300 MG/ML  SOLN  COMPARISON:  Chest CT  December 31, 2013 and chest radiograph January 04, 2014  FINDINGS: Overall, the right-sided effusion is significantly smaller than on recent chest CT, status post chest tube placement. Note that the tube tip is in the right upper hemi thorax or fluid is drain. There is pleural effusion, moderate, in the right base which is not connected to the chest tube and remains. Two loculated fluid collections are seen adjacent to the right heart border. There is moderate consolidation in the right lower lobe.  There is mild left base atelectatic change. Left lung is otherwise clear.  There is no appreciable thoracic adenopathy. Pericardium is not thickened.  In the visualized upper abdomen, there is fatty liver. Visualized upper abdominal structures otherwise appear normal. There are no blastic or lytic bone lesions. Visualized thyroid appears unremarkable.  IMPRESSION: There has been drainage of most of the loculated effusion from the upper right hemithorax due to the chest tube which is present in this area. Pleural fluid in the right base which appears at least partially free in partially loculated remains. There are two loculated fluid collections which abuts the right heart border which also remain and are separate from the chest tube. There is patchy consolidation throughout much of the right lower lobe. Left lung is clear except for rather minimal left base atelectatic change.  There is fatty liver.   Electronically Signed   By: Lowella Grip M.D.   On: 01/04/2014 19:08   Ct Angio Chest Pe W/cm &/or Wo Cm  12/31/2013   CLINICAL DATA:  Shortness of breath.  Large right  pleural effusion.  EXAM: CT ANGIOGRAPHY CHEST WITH CONTRAST  TECHNIQUE: Multidetector CT imaging of the chest was performed using the standard protocol during bolus administration of intravenous contrast. Multiplanar CT image reconstructions and MIPs were obtained to evaluate the vascular anatomy.  CONTRAST:  11mL OMNIPAQUE IOHEXOL 350 MG/ML SOLN   COMPARISON:  Chest x-ray, same date.  FINDINGS: Chest wall:  No chest wall mass, supraclavicular or axillary lymphadenopathy. The thyroid gland is normal. The bony thorax is intact. No destructive bone lesions or spinal canal compromise. The sternum is intact.  Mediastinum:  The heart is normal in size. No mediastinal or hilar mass or adenopathy. Borderline scattered lymph nodes. The esophagus is grossly normal. The aorta is normal in caliber. No dissection.  Lungs:  There is a large loculated appearing right pleural effusion occupying a good portion of the right hemi thorax. There is severe compressive atelectasis of the right long which is near complete except for the right upper lobe. No endobronchial lesion or obvious bronchial obstruction. I do not see any obvious enhancement of the pleural to suggest empyema. No pleural nodularity. The left lung is clear except because of the large pleural effusion there is mild mass effect on the heart and mediastinum to the left.  Upper abdomen:  Unremarkable.  Review of the MIP images confirms the above findings.  IMPRESSION: 1. Large loculated appearing right pleural effusion with near complete compressive atelectasis of the right lung. There is also mass effect on the heart and mediastinal structures which are shifted to the left. I do not see any obvious pleural enhancement but empyema is a possibility. 2. No mediastinal or hilar mass or adenopathy.   Electronically Signed   By: Kalman Jewels M.D.   On: 12/31/2013 21:30   Mr Lumbar Spine Wo Contrast  01/07/2014   CLINICAL DATA:  LEFT leg burning and weakness. Evaluate for possible abscess.  EXAM: MRI LUMBAR SPINE WITHOUT CONTRAST  TECHNIQUE: Multiplanar, multisequence MR imaging of the lumbar spine was performed. No intravenous contrast was administered.  COMPARISON:  CT lumbar spine performed 12/20/2013.  FINDINGS: Anatomic alignment. Normal conus. Hypercellular marrow could indicate chronic disease, smoking, or be  related to increased body habitus. No focal areas of marrow replacement.  No appreciable asymmetry of size or signal of the psoas muscles.  Normal conus.  No intraspinal mass lesion.  At L1-2, there are Schmorl's nodes with mild annular bulging but no impingement.  At L2-3, there is mild annular bulging and mild facet arthropathy without impingement.  The L3-4, and L4-5 disc spaces appear unremarkable.  At L5-S1, there is a tiny annular fissure in the LEFT foramen without frank disc protrusion. Transitional anatomy with partially fused facet joint on the RIGHT at this level.  IMPRESSION: Tiny annular fissure at L5-S1 on the LEFT. It is unclear if this could be symptomatic.  No appreciable asymmetry in size or signal of the psoas muscles.   Electronically Signed   By: Rolla Flatten M.D.   On: 01/07/2014 20:49   Dg Chest Port 1 View  01/18/2014   CLINICAL DATA:  Tachycardia, chest pain, shortness of breath, unspecified drug use  EXAM: PORTABLE CHEST - 1 VIEW  COMPARISON:  Portable exam 1348 hr compared to 01/12/2014  FINDINGS: Enlargement of cardiac silhouette with slight pulmonary vascular congestion.  Mediastinal contours stable.  Peribronchial thickening with minimal RIGHT basilar atelectasis versus infiltrate little changed.  Remaining lungs clear.  No pleural effusion or pneumothorax.  IMPRESSION: Bronchitic changes with persistent  atelectasis versus infiltrate at RIGHT base.   Electronically Signed   By: Lavonia Dana M.D.   On: 01/18/2014 13:59   Dg Chest Port 1 View  01/11/2014   CLINICAL DATA:  Pneumonia.  Right-sided chest tube.  EXAM: PORTABLE CHEST - 1 VIEW  COMPARISON:  01/10/2014.  FINDINGS: Pigtail catheter in the inferior right hemi thorax is stable. There is surrounding right lung base consolidation. The lungs show diffuse irregular interstitial densities, also unchanged. No pneumothorax.  Cardiac silhouette is mildly enlarged.  IMPRESSION: No change from the previous day's study.  No pneumothorax.    Electronically Signed   By: Lajean Manes M.D.   On: 01/11/2014 07:14   Dg Chest Port 1 View  01/10/2014   CLINICAL DATA:  Pneumonia. Right pleural effusion. Right chest tube.  EXAM: PORTABLE CHEST - 1 VIEW  COMPARISON:  01/09/2014.  FINDINGS: Right chest tube in stable position. Mediastinum and hilar structures are stable. Stable cardiomegaly with pulmonary venous congestion and bilateral interstitial prominence consistent with congestive heart failure. Stable small right pleural effusion. No pneumothorax. No acute osseus abnormality.  IMPRESSION: 1. Right chest tube in stable position. Stable small right pleural effusion. 2. Stable changes of congestive heart failure and pulmonary interstitial edema.   Electronically Signed   By: Marcello Moores  [place]   On: 01/10/2014 07:46   Dg Chest Port 1 View  01/09/2014   CLINICAL DATA:  Pleural effusion  EXAM: PORTABLE CHEST - 1 VIEW  COMPARISON:  January 06, 2014  FINDINGS: There is a chest tube on the right with significant interval resolution of pleural effusion. A small pleural effusion remains on the right. There is moderate generalized interstitial edema with cardiomegaly. No airspace consolidation. No bony lesions. No apparent pneumothorax.  IMPRESSION: Chest tube at right base. There is evidence of congestive heart failure with edema and cardiomegaly. There is a small residual effusion on the right. No pneumothorax apparent   Electronically Signed   By: Lowella Grip M.D.   On: 01/09/2014 07:00   Dg Chest Port 1 View  01/06/2014   CLINICAL DATA:  Status post right chest tube removal  EXAM: PORTABLE CHEST - 1 VIEW  COMPARISON:  Portable chest x-ray of January 04, 2014  FINDINGS: There has been interval removal of the small caliber chest tube on the right. There remains pleural thickening or fluid along the right lateral thoracic wall and at the right lung base. There is no pneumothorax. The left lung is adequately inflated and grossly clear. The cardiac  silhouette is top-normal in size but stable. The central pulmonary vascularity is prominent but also stable.  IMPRESSION: There is no evidence of significant additional pleural fluid reaccumulation nor development of a pneumothorax since removal of the right-sided chest tube.   Electronically Signed   By: Jorden Minchey  [place]   On: 01/06/2014 12:22   Dg Chest Port 1 View  01/04/2014   CLINICAL DATA:  Shortness of breath, cough, large pleural effusion  EXAM: PORTABLE CHEST - 1 VIEW  COMPARISON:  Portable exam 0948 hr compared to 0515 hr  FINDINGS: Pigtail RIGHT thoracostomy tube unchanged.  Enlargement of cardiac silhouette.  Mediastinal contours and pulmonary vascularity normal.  Persistent RIGHT pleural effusion and basilar atelectasis.  Lungs otherwise clear.  No pneumothorax or LEFT pleural effusion identified.  IMPRESSION: Persistent RIGHT pleural effusion post thoracostomy tube placement.  Persistent atelectasis RIGHT base, little changed from earlier study.   Electronically Signed   By: Lavonia Dana M.D.   On:  01/04/2014 09:55   Dg Chest Port 1 View  01/04/2014   CLINICAL DATA:  Followup pleural effusion. Status post pigtail drainage.  EXAM: PORTABLE CHEST - 1 VIEW  COMPARISON:  12/2013.  FINDINGS: Cardiac enlargement persists. Pigtail catheter missing good position. Considerable effusion remains inferior to the location of the drainage catheter. There is consolidation and atelectasis at the RIGHT mid and lower lung zones. The bones are unremarkable.  IMPRESSION: Stable chest. Considerable right-sided pleural effusion remains, unchanged from 01/03/2014.   Electronically Signed   By: Rolla Flatten M.D.   On: 01/04/2014 07:18   Dg Chest Port 1 View  01/03/2014   CLINICAL DATA:  Right pleural effusion  EXAM: PORTABLE CHEST - 1 VIEW  COMPARISON:  Chest x-ray from yesterday  FINDINGS: Significant decrease in a right pleural effusion. The pleural fluid volume is now moderate, located along the lower and lateral  right chest. Some fissural fluid is present. There is no overt re-expansion pulmonary edema. There has been relatively good re-expansion of the right lung. Streaky retrocardiac opacity, similar to previous.  Unchanged cardiomegaly. Stable upper mediastinal contours, likely with loculated pleural fluid along the peritracheal stripe.  IMPRESSION: Significant decrease in right pleural effusion with good re-expansion of the right lung.   Electronically Signed   By: Jorje Guild M.D.   On: 01/03/2014 05:34   Dg Chest Port 1 View  01/02/2014   CLINICAL DATA:  Right chest tube placement  EXAM: PORTABLE CHEST - 1 VIEW  COMPARISON:  01/02/2014  FINDINGS: Large right pleural effusion is unchanged. Right lower lobe is collapsed. Small amount of aerated right upper lobe unchanged. Pigtail catheter overlying the right chest is unchanged. No pneumothorax.  Left lung is clear.  Cardiac enlargement.  IMPRESSION: Large right effusion and collapse of the right lower lobe unchanged. Right pigtail catheter unchanged in position.   Electronically Signed   By: Franchot Gallo M.D.   On: 01/02/2014 14:24   Dg Chest Port 1 View  01/02/2014   CLINICAL DATA:  Right pleural drainage catheter  EXAM: PORTABLE CHEST - 1 VIEW  COMPARISON:  Chest x-ray from yesterday  FINDINGS: A large right pleural effusion, which is loculated by CT, shows slight interval decrease in volume. Leftward mediastinal shift persists. Congested appearance of the left lung vessels likely related to extensive lung compression on the right. A right-sided pleural drainage catheter is in unchanged position, located at the mid to upper chest level. Stable heart size and mediastinal contours.  IMPRESSION: Mild decrease in a still large right pleural effusion.   Electronically Signed   By: Jorje Guild M.D.   On: 01/02/2014 05:44   Dg Chest Port 1 View  01/01/2014   CLINICAL DATA:  Chest tube placement.  EXAM: PORTABLE CHEST - 1 VIEW  COMPARISON:  CT 12/31/2013   FINDINGS: Right chest tube noted over the right upper chest. Prominent right pleural effusion remains. No pneumothorax. Left lung is clear. Cardiac structures are unremarkable. No acute bony abnormality.  IMPRESSION: Interim placement of right chest tube, its tip is projected over the right upper chest. Persistent large right pleural effusion.   Electronically Signed   By: Thor   On: 01/01/2014 10:18   Dg Knee Left Port  01/02/2014   CLINICAL DATA:  Left knee pain after fall.  EXAM: PORTABLE LEFT KNEE - 1-2 VIEW  COMPARISON:  December 20, 2013.  FINDINGS: There is no evidence of fracture, dislocation, or joint effusion. There is no evidence of  arthropathy or other focal bone abnormality. Soft tissues are unremarkable.  IMPRESSION: Normal left knee.   Electronically Signed   By: Sabino Dick M.D.   On: 01/02/2014 11:42   Ct Image Guided Fluid Drain By Catheter  01/08/2014   CLINICAL DATA:  Loculated right pleural effusion  EXAM: CT-GUIDED RIGHT POSTERIOR 10 FRENCH CHEST TUBE INSERTION  Date:  8/25/20158/25/2015 2:05 pm  Radiologist:  M. Daryll Brod, MD  Guidance:  CT  FLUOROSCOPY TIME:  None.  MEDICATIONS AND MEDICAL HISTORY: 2 mg Versed, 50 mcg fentanyl  ANESTHESIA/SEDATION: 15 min  CONTRAST:  None.  COMPLICATIONS: No immediate  PROCEDURE: Informed consent was obtained from the patient following explanation of the procedure, risks, benefits and alternatives. The patient understands, agrees and consents for the procedure. All questions were addressed. A time out was performed.  Maximal barrier sterile technique utilized including caps, mask, sterile gowns, sterile gloves, large sterile drape, hand hygiene, and Betadine.  Previous imaging reviewed. Patient positioned right anterior oblique. Noncontrast localization CT performed. The posterior loculated right pleural effusion was localized. Under sterile conditions and local anesthesia, an 18 gauge introducer needle was advanced from a lateral  intercostal approach into the fluid collection. Needle position confirmed with CT. Syringe aspiration yielded pleural fluid. Guidewire advanced followed by dilatation to insert a 10 [place] drain. Catheter position confirmed with CT. No immediate complication. Patient tolerated the procedure well. Catheter secured with a Prolene suture and connected to external pleura vac.  IMPRESSION: Successful CT-guided 10 French right chest drain insertion for a loculated pleural effusion.   Electronically Signed   By: Daryll Brod M.D.   On: 01/08/2014 15:27         Subjective: Patient was not cooperative with the interview or examination. He denied any headache, chest pain, or respiratory distress. He stated he is breathing better. When asked about other parts of the review of systems, the patient stated, "what difference does it make if I do not want to answer you."  Objective: Filed Vitals:   01/18/14 1738 01/18/14 2100 01/18/14 2253 01/19/14 0421  BP: 134/87 138/81  138/86  Pulse: 108 117 120 111  Temp: 98.1 F (36.7 C) 98 F (36.7 C)  98.1 F (36.7 C)  TempSrc: Oral Oral  Oral  Resp: 20 19  20   Height:      Weight:      SpO2: 98% 98%  96%    Intake/Output Summary (Last 24 hours) at 01/19/14 0748 Last data filed at 01/19/14 6734  Gross per 24 hour  Intake   3000 ml  Output   1925 ml  Net   1075 ml   Weight change:  Exam:   General:  Pt is alert,  not in acute distress; patient is belligerent and curt and does not follow commands   HEENT: No icterus, No thrush, No neck mass, [place]/AT; no meningismus   Cardiovascular: RRR, S1/S2, no rubs, no gallops  Respiratory: Bibasilar rales, right greater than left  Abdomen: Patient refused examination   Extremities: No edema, No lymphangitis, No petechiae, No rashes, no synovitis  Data Reviewed: Basic Metabolic Panel:  Recent Labs Lab 01/18/14 1306 01/19/14 0418  NA 139 140  K 3.4* 3.3*  CL 97 104  CO2 21 22  GLUCOSE 185* 99    BUN 15 13  CREATININE 1.48* 1.21  CALCIUM 10.6* 9.2   Liver Function Tests:  Recent Labs Lab 01/18/14 1306  AST 25  ALT 23  ALKPHOS 100  BILITOT 0.7  PROT 8.8*  ALBUMIN 3.6   No results found for this basename: LIPASE, AMYLASE,  in the last 168 hours No results found for this basename: AMMONIA,  in the last 168 hours CBC:  Recent Labs Lab 01/18/14 1306 01/19/14 0418  WBC 20.7* 14.6*  NEUTROABS 18.3*  --   HGB 13.4 10.9*  HCT 39.3 33.1*  MCV 88.3 88.3  PLT 610* 417*   Cardiac Enzymes: No results found for this basename: CKTOTAL, CKMB, CKMBINDEX, TROPONINI,  in the last 168 hours BNP: No components found with this basename: POCBNP,  CBG:  Recent Labs Lab 01/18/14 1243  GLUCAP 192*    No results found for this or any previous visit (from the past 240 hour(s)).   Scheduled Meds: . amLODipine  5 mg Oral Daily  . enoxaparin (LOVENOX) injection  40 mg Subcutaneous Q24H  . folic acid  1 mg Oral Daily  . LORazepam  0-4 mg Intravenous Q6H   Followed by  . [START ON 01/20/2014] LORazepam  0-4 mg Intravenous Q12H  . multivitamin with minerals  1 tablet Oral Daily  . piperacillin-tazobactam (ZOSYN)  IV  3.375 g Intravenous 3 times per day  . thiamine  100 mg Oral Daily   Or  . thiamine  100 mg Intravenous Daily  . vancomycin  1,000 mg Intravenous Q8H   Continuous Infusions: . sodium chloride Stopped (01/18/14 1700)  . sodium chloride 75 mL/hr at 01/19/14 0443     Aleph Nickson, DO  [facility] Pager [phone]  If 7PM-7AM, please contact night-coverage [email or web address] Password [email or web address] 01/19/2014, 7:48 AM   LOS: 1 day

## 2014-01-20 LAB — BASIC METABOLIC PANEL
ANION GAP: 13 (ref 5–15)
BUN: 7 mg/dL (ref 6–23)
CHLORIDE: 103 meq/L (ref 96–112)
CO2: 25 meq/L (ref 19–32)
Calcium: 9.3 mg/dL (ref 8.4–10.5)
Creatinine, Ser: 1.09 mg/dL (ref 0.50–1.35)
GFR calc Af Amer: >90 mL/min (ref >90)
GFR calc non Af Amer: 83 mL/min — ABNORMAL LOW (ref >90)
GLUCOSE: 103 mg/dL — AB (ref 70–99)
POTASSIUM: 3.5 meq/L — AB (ref 3.7–5.3)
SODIUM: 141 meq/L (ref 137–147)

## 2014-01-20 LAB — CBC
HEMATOCRIT: 33 % — AB (ref 39.0–52.0)
Hemoglobin: 11 g/dL — ABNORMAL LOW (ref 13.0–17.0)
MCH: 29.4 pg (ref 26.0–34.0)
MCHC: 33.3 g/dL (ref 30.0–36.0)
MCV: 88.2 fL (ref 78.0–100.0)
Platelets: 444 10*3/uL — ABNORMAL HIGH (ref 150–400)
RBC: 3.74 MIL/uL — AB (ref 4.22–5.81)
RDW: 14.8 % (ref 11.5–15.5)
WBC: 11 10*3/uL — AB (ref 4.0–10.5)

## 2014-01-20 LAB — HIV ANTIBODY (ROUTINE TESTING W REFLEX): HIV: NONREACTIVE

## 2014-01-20 LAB — VANCOMYCIN, TROUGH: VANCOMYCIN TR: 22.1 ug/mL — AB (ref 10.0–20.0)

## 2014-01-20 LAB — MAGNESIUM: Magnesium: 2 mg/dL (ref 1.5–2.5)

## 2014-01-20 MED ORDER — HALOPERIDOL 2 MG PO TABS
2.0000 mg | ORAL_TABLET | Freq: Three times a day (TID) | ORAL | Status: DC
  Administered 2014-01-20 – 2014-01-23 (×11): 2 mg via ORAL
  Filled 2014-01-20 (×14): qty 1

## 2014-01-20 MED ORDER — VANCOMYCIN HCL 10 G IV SOLR
1250.0000 mg | Freq: Two times a day (BID) | INTRAVENOUS | Status: DC
  Administered 2014-01-21 (×2): 1250 mg via INTRAVENOUS
  Filled 2014-01-20 (×2): qty 1250

## 2014-01-20 NOTE — Progress Notes (Signed)
CRITICAL VALUE ALERT  Critical value received: blood culture aerobic + gram + cocci  In clusters  Date of notification:  01/19/14  Time of notification:  2315  Critical value read back:yes  Nurse who received alert:  E Barrett Henle  MD notified (1st page):  Fredirick Maudlin NP  Time of first page:  2330  MD notified (2nd page):  Time of second page:  Responding MD:  Fredirick Maudlin NP  Time MD responded:  2330

## 2014-01-20 NOTE — Progress Notes (Signed)
ANTIBIOTIC CONSULT NOTE   Pharmacy Consult for Vancomycin & Zosyn Indication: rule out sepsis  No Known Allergies  Patient Measurements: Height: 5\' 10"  (177.8 cm) Weight: 199 lb 4.7 oz (90.4 kg) IBW/kg (Calculated) : 73 Total body weight: 95.3kg  Vital Signs: Temp: 97.4 F (36.3 C) (09/06 0626) Temp src: Oral (09/06 0626) BP: 116/87 mmHg (09/06 0626) Pulse Rate: 109 (09/06 0626) Intake/Output from previous day: 09/05 0701 - 09/06 0700 In: 2870 [P.O.:960; I.V.:910; IV Piggyback:1000] Out: 1200 [Urine:1200] Intake/Output from this shift: Total I/O In: 240 [P.O.:240] Out: 600 [Urine:600]  Labs:  Recent Labs  01/18/14 1306 01/19/14 0418 01/20/14 1300  WBC 20.7* 14.6* 11.0*  HGB 13.4 10.9* 11.0*  PLT 610* 417* 444*  CREATININE 1.48* 1.21 1.09   Estimated Creatinine Clearance: 100.9 ml/min (by C-G formula based on Cr of 1.09).  Recent Labs  01/20/14 1315  [place] 22.1*     Microbiology: Recent Results (from the past 720 hour(s))  CULTURE, BLOOD (ROUTINE X 2)     Status: None   Collection Time    12/31/13  6:30 PM      Result Value Ref Range Status   Specimen Description BLOOD LEFT WRIST   Final   Special Requests BOTTLES DRAWN AEROBIC AND ANAEROBIC 6 ML   Final   Culture  Setup Time     Final   Value: 12/31/2013 22:37     Performed at [facility]   Culture     Final   Value: NO GROWTH 5 DAYS     Performed at [facility]   Report Status 01/06/2014 FINAL   Final  CULTURE, BLOOD (ROUTINE X 2)     Status: None   Collection Time    12/31/13  6:40 PM      Result Value Ref Range Status   Specimen Description BLOOD RIGHT HAND   Final   Special Requests BOTTLES DRAWN AEROBIC AND ANAEROBIC 4 ML   Final   Culture  Setup Time     Final   Value: 12/31/2013 22:38     Performed at [facility]   Culture     Final   Value: NO GROWTH 5 DAYS     Performed at [facility]   Report Status 01/06/2014 FINAL   Final  MRSA PCR  SCREENING     Status: None   Collection Time    01/01/14  1:51 AM      Result Value Ref Range Status   MRSA by PCR NEGATIVE  NEGATIVE Final   Comment:            The GeneXpert MRSA Assay (FDA     approved for NASAL specimens     only), is one component of a     comprehensive MRSA colonization     surveillance program. It is not     intended to diagnose MRSA     infection nor to guide or     monitor treatment for     MRSA infections.  GRAM STAIN     Status: None   Collection Time    01/01/14 10:05 AM      Result Value Ref Range Status   Specimen Description PLEURAL   Final   Special Requests NONE   Final   Gram Stain     Final   Value: CYTOSPIN     WBC PRESENT,BOTH PMN AND MONONUCLEAR     NO ORGANISMS SEEN     Gram Stain Report  Called to,Read Back By and Verified With: A. ARNOLD RN AT 1050 ON 08.18.15 BY SHUEA   Report Status 01/01/2014 FINAL   Final  BODY FLUID CULTURE     Status: None   Collection Time    01/01/14 10:05 AM      Result Value Ref Range Status   Specimen Description PLEURAL   Final   Special Requests Normal   Final   Gram Stain     Final   Value: CYTOSPIN SLIDE WBC PRESENT,BOTH PMN AND MONONUCLEAR     NO ORGANISMS SEEN     Gram Stain Report Called to,Read Back By and Verified With: Gram Stain Report Called to,Read Back By and Verified With: A ARNOLD RN 1050AM 01/01/14 BY SHUEA Performed at [facility]     Performed at [facility]   Culture     Final   Value: NO GROWTH 3 DAYS     Performed at [facility]   Report Status 01/04/2014 FINAL   Final  AFB CULTURE WITH SMEAR     Status: None   Collection Time    01/04/14  9:52 AM      Result Value Ref Range Status   Specimen Description PLEURAL   Final   Special Requests Normal   Final   Acid Fast Smear     Final   Value: NO ACID FAST BACILLI SEEN     Performed at [facility]   Culture     Final   Value: CULTURE WILL BE EXAMINED FOR 6 WEEKS BEFORE ISSUING A FINAL REPORT      Performed at [facility]   Report Status PENDING   Incomplete  BODY FLUID CULTURE     Status: None   Collection Time    01/04/14  9:52 AM      Result Value Ref Range Status   Specimen Description PLEURAL   Final   Special Requests Normal   Final   Gram Stain     Final   Value: RARE WBC PRESENT, PREDOMINANTLY PMN     NO ORGANISMS SEEN     Performed at [facility]   Culture     Final   Value: NO GROWTH 3 DAYS     Performed at [facility]   Report Status 01/07/2014 FINAL   Final  FUNGUS CULTURE W SMEAR     Status: None   Collection Time    01/04/14  9:52 AM      Result Value Ref Range Status   Specimen Description PLEURAL   Final   Special Requests NONE   Final   Fungal Smear     Final   Value: NO YEAST OR FUNGAL ELEMENTS SEEN     Performed at [facility]   Culture     Final   Value: CULTURE IN PROGRESS FOR FOUR WEEKS     Performed at [facility]   Report Status PENDING   Incomplete  CULTURE, BLOOD (ROUTINE X 2)     Status: None   Collection Time    01/18/14  1:04 PM      Result Value Ref Range Status   Specimen Description BLOOD RIGHT ANTECUBITAL   Final   Special Requests BOTTLES DRAWN AEROBIC AND ANAEROBIC 5ML   Final   Culture  Setup Time     Final   Value: 01/18/2014 20:45     Performed at [facility]  Final   Value:        BLOOD CULTURE RECEIVED NO GROWTH TO DATE CULTURE WILL BE HELD FOR 5 DAYS BEFORE ISSUING A FINAL NEGATIVE REPORT     Performed at [facility]   Report Status PENDING   Incomplete  CULTURE, BLOOD (ROUTINE X 2)     Status: None   Collection Time    01/18/14  1:07 PM      Result Value Ref Range Status   Specimen Description BLOOD RIGHT ARM   Final   Special Requests BOTTLES DRAWN AEROBIC ONLY 1ML   Final   Culture  Setup Time     Final   Value: 01/18/2014 20:45     Performed at [facility]   Culture     Final   Value: GRAM POSITIVE COCCI IN CLUSTERS     Note:  CRITICAL RESULT CALLED TO, READ BACK BY AND VERIFIED WITH: Nelia Shi RN @1109PM  Thelma Comp [id number]     Performed at [facility]   Report Status PENDING   Incomplete  URINE CULTURE     Status: None   Collection Time    01/18/14  3:43 PM      Result Value Ref Range Status   Specimen Description URINE, RANDOM   Final   Special Requests NONE   Final   Culture  Setup Time     Final   Value: 01/18/2014 20:05     Performed at [facility]     Final   Value: NO GROWTH     Performed at [facility]   Culture     Final   Value: NO GROWTH     Performed at [facility]   Report Status 01/19/2014 FINAL   Final   Medical History: Past Medical History  Diagnosis Date  . Anxiety   . Ulcer    Medications:  Scheduled:  . amLODipine  5 mg Oral Daily  . enoxaparin (LOVENOX) injection  40 mg Subcutaneous Q24H  . folic acid  1 mg Oral Daily  . haloperidol  5 mg Oral TID  . LORazepam  0-4 mg Intravenous Q6H   Followed by  . LORazepam  0-4 mg Intravenous Q12H  . multivitamin with minerals  1 tablet Oral Daily  . piperacillin-tazobactam (ZOSYN)  IV  3.375 g Intravenous 3 times per day  . thiamine  100 mg Oral Daily   Or  . thiamine  100 mg Intravenous Daily  . [START ON 01/21/2014] vancomycin  1,250 mg Intravenous Q12H   Anti-infectives   Start     Dose/Rate Route Frequency Ordered Stop   01/21/14 0200  vancomycin (VANCOCIN) 1,250 mg in sodium chloride 0.9 % 250 mL IVPB     1,250 mg 166.7 mL/hr over 90 Minutes Intravenous Every 12 hours 01/20/14 1404     01/18/14 2200  vancomycin (VANCOCIN) IVPB 1000 mg/200 mL premix  Status:  Discontinued     1,000 mg 200 mL/hr over 60 Minutes Intravenous Every 8 hours 01/18/14 1347 01/20/14 1404   01/18/14 2200  piperacillin-tazobactam (ZOSYN) IVPB 3.375 g     3.375 g 12.5 mL/hr over 240 Minutes Intravenous 3 times per day 01/18/14 1347     01/18/14 1400  vancomycin (VANCOCIN) 2,000 mg in sodium chloride 0.9 % 500  mL IVPB     2,000 mg 250 mL/hr over 120 Minutes Intravenous  Once 01/18/14 1306 01/18/14 1611   01/18/14 1300  piperacillin-tazobactam (ZOSYN)  IVPB 3.375 g     3.375 g 100 mL/hr over 30 Minutes Intravenous  Once 01/18/14 1300 01/18/14 1420   01/18/14 1300  vancomycin (VANCOCIN) IVPB 1000 mg/200 mL premix  Status:  Discontinued     1,000 mg 200 mL/hr over 60 Minutes Intravenous  Once 01/18/14 1300 01/18/14 1306     Assessment: 43yoM to ED via EMS, found in parking lot with right leg between concrete wall and fence. Hx of ETOH, Cocaine, Heroin use, UDS pending and labs pending. Tachycardia noted by EMS, Vancomycin and Zosyn ordered x1 in ED, and pharmacy to dose, rule out sepsis.  Recent Vancomycin dosing at 1gm q12 delivered subtherapeutic trough  9/4 >> Vanc >> 9/4 >> Zosyn >>   Tmax: Afeb WBCs: elevated but improving AKI: SCr elevated but improved (baseline 0.8 last week) 1.48 > 1.21, CrCl 90CG, 80N  9/4 blood: GPC in clusters 9/4 urine: NG f 9/4 urine strep Ag: neg 9/4 urine legionella Ag: neg 9/4 HIV Ab: pending  Drug level / dose changes info: 9/6 VT at 13:00 = 22 on Vanc 1g IV Q8H (SUPRAtherapeutic)  Goal of Therapy:  Vancomycin trough level 15-20 mcg/ml  Plan:   Change Vancomycin to 1250mg  IV Q12h  Continue Zosyn 3.375gm q8h-4 hr infusion  Follow renal function  Kizzie Furnish, PharmD Pager: [phone] 01/20/2014 2:05 PM

## 2014-01-20 NOTE — Consult Note (Signed)
Ralph Chavez Psychiatry Consult   Reason for Consult:  Confusion, substance induced psychosis Referring Physician:  Dr.Tat Ralph Chavez is an 43 y.o. male. Total Time spent with patient: 30 minutes  Assessment: AXIS I:  Psychotic Disorder NOS, Substance Abuse and Substance Induced Mood Disorder AXIS II:  Deferred AXIS III:   Past Medical History  Diagnosis Date  . Anxiety   . Ulcer    AXIS IV:  problems related to social environment AXIS V:  31-40 impairment in reality testing  Plan:  Patient does not meet criteria for psychiatric inpatient admission.  Subjective:   Ralph Chavez is a 43 y.o. male patient admitted with  Empyema, confusion, substance abuse.  HPI:  Pt is a 43 year old white male who lives alone in [place]. He was recently admitted but left AMA while being treated for empyema. His girlfriend Joellen Jersey is here and reports he has been belligerent and confused recently. He has been paranoid and talking to himself . On day of admission, he refused to leave a car and spent the night on the streets. He admits to recent use of cocaine and crystal meth 3 days ago. Drug screen only positive for benzodiazepines.Denies use of alcohol He has prior history of substance abuse treatment and living in halfway house 2 years ago. According to girlfriend he relapsed this summer. She states he is usually not this angry or confused  Patient seen again today, 01/20/14. He is somewhat lethargic and just wet himself. He no longer is belligerent or angry.  HPI Elements:   Location:  global. Quality:  severe. Severity:  severe. Timing:  3 days. Duration:  several years. Context:  sepsis, drug use.  Past Psychiatric History: Past Medical History  Diagnosis Date  . Anxiety   . Ulcer     reports that he has been smoking Cigarettes.  He has been smoking about 0.00 packs per day. He has never used smokeless tobacco. He reports that he drinks alcohol. He reports that he uses illicit  drugs (Cocaine and Heroin). History reviewed. No pertinent family history.   Living Arrangements: Other (Comment) (notes in computer state pt is staying at possible drug house, pt states he is homeless)   Abuse/Neglect [facility] [employer]) Physical Abuse: Denies Verbal Abuse: Denies Sexual Abuse: Denies Allergies:  No Known Allergies   Objective: Blood pressure 116/87, pulse 109, temperature 97.4 F (36.3 C), temperature source Oral, resp. rate 18, height 5' 10"  (1.778 m), weight 199 lb 4.7 oz (90.4 kg), SpO2 99.00%.Body mass index is 28.6 kg/(m^2). Results for orders placed during the hospital encounter of 01/18/14 (from the past 72 hour(s))  CBG MONITORING, ED     Status: Abnormal   Collection Time    01/18/14 12:43 PM      Result Value Ref Range   Glucose-Capillary 192 (*) 70 - 99 mg/dL  CULTURE, BLOOD (ROUTINE X 2)     Status: None   Collection Time    01/18/14  1:04 PM      Result Value Ref Range   Specimen Description BLOOD RIGHT ANTECUBITAL     Special Requests BOTTLES DRAWN AEROBIC AND ANAEROBIC 5ML     Culture  Setup Time       Value: 01/18/2014 20:45     Performed at [facility]   Culture       Value:        BLOOD CULTURE RECEIVED NO GROWTH TO DATE CULTURE WILL BE HELD FOR 5 DAYS BEFORE ISSUING A FINAL  NEGATIVE REPORT     Performed at [facility]   Report Status PENDING    CBC WITH DIFFERENTIAL     Status: Abnormal   Collection Time    01/18/14  1:06 PM      Result Value Ref Range   WBC 20.7 (*) 4.0 - 10.5 K/uL   RBC 4.45  4.22 - 5.81 MIL/uL   Hemoglobin 13.4  13.0 - 17.0 g/dL   HCT 39.3  39.0 - 52.0 %   MCV 88.3  78.0 - 100.0 fL   MCH 30.1  26.0 - 34.0 pg   MCHC 34.1  30.0 - 36.0 g/dL   RDW 14.8  11.5 - 15.5 %   Platelets 610 (*) 150 - 400 K/uL   Neutrophils Relative % 88 (*) 43 - 77 %   Neutro Abs 18.3 (*) 1.7 - 7.7 K/uL   Lymphocytes Relative 6 (*) 12 - 46 %   Lymphs Abs 1.2  0.7 - 4.0 K/uL   Monocytes Relative 5  3 - 12 %   Monocytes Absolute 1.0   0.1 - 1.0 K/uL   Eosinophils Relative 1  0 - 5 %   Eosinophils Absolute 0.2  0.0 - 0.7 K/uL   Basophils Relative 0  0 - 1 %   Basophils Absolute 0.1  0.0 - 0.1 K/uL  COMPREHENSIVE METABOLIC PANEL     Status: Abnormal   Collection Time    01/18/14  1:06 PM      Result Value Ref Range   Sodium 139  137 - 147 mEq/L   Potassium 3.4 (*) 3.7 - 5.3 mEq/L   Chloride 97  96 - 112 mEq/L   CO2 21  19 - 32 mEq/L   Glucose, Bld 185 (*) 70 - 99 mg/dL   BUN 15  6 - 23 mg/dL   Creatinine, Ser 1.48 (*) 0.50 - 1.35 mg/dL   Calcium 10.6 (*) 8.4 - 10.5 mg/dL   Total Protein 8.8 (*) 6.0 - 8.3 g/dL   Albumin 3.6  3.5 - 5.2 g/dL   AST 25  0 - 37 U/L   Comment: SLIGHT HEMOLYSIS     HEMOLYSIS AT THIS LEVEL MAY AFFECT RESULT   ALT 23  0 - 53 U/L   Alkaline Phosphatase 100  39 - 117 U/L   Total Bilirubin 0.7  0.3 - 1.2 mg/dL   GFR calc non Af Amer 57 (*) >90 mL/min   GFR calc Af Amer 66 (*) >90 mL/min   Comment: (NOTE)     The eGFR has been calculated using the CKD EPI equation.     This calculation has not been validated in all clinical situations.     eGFR's persistently <90 mL/min signify possible Chronic Kidney     Disease.   Anion gap 21 (*) 5 - 15  ETHANOL     Status: None   Collection Time    01/18/14  1:06 PM      Result Value Ref Range   Alcohol, Ethyl (B) <11  0 - 11 mg/dL   Comment:            LOWEST DETECTABLE LIMIT FOR     SERUM ALCOHOL IS 11 mg/dL     FOR MEDICAL PURPOSES ONLY  SALICYLATE LEVEL     Status: Abnormal   Collection Time    01/18/14  1:06 PM      Result Value Ref Range   Salicylate Lvl <1.6 (*) 2.8 - 20.0 mg/dL  ACETAMINOPHEN LEVEL     Status: None   Collection Time    01/18/14  1:06 PM      Result Value Ref Range   Acetaminophen (Tylenol), Serum <15.0  10 - 30 ug/mL   Comment:            THERAPEUTIC CONCENTRATIONS VARY     SIGNIFICANTLY. A RANGE OF 10-30     ug/mL MAY BE AN EFFECTIVE     CONCENTRATION FOR MANY PATIENTS.     HOWEVER, SOME ARE BEST TREATED      AT CONCENTRATIONS OUTSIDE THIS     RANGE.     ACETAMINOPHEN CONCENTRATIONS     >150 ug/mL AT 4 HOURS AFTER     INGESTION AND >50 ug/mL AT 12     HOURS AFTER INGESTION ARE     OFTEN ASSOCIATED WITH TOXIC     REACTIONS.  T4, FREE     Status: None   Collection Time    01/18/14  1:06 PM      Result Value Ref Range   Free T4 1.69  0.80 - 1.80 ng/dL   Comment: Performed at [facility], BLOOD (ROUTINE X 2)     Status: None   Collection Time    01/18/14  1:07 PM      Result Value Ref Range   Specimen Description BLOOD RIGHT ARM     Special Requests BOTTLES DRAWN AEROBIC ONLY 1ML     Culture  Setup Time       Value: 01/18/2014 20:45     Performed at [facility]   Culture       Value: [place] IN [place]     Note: CRITICAL RESULT CALLED TO, READ BACK BY AND VERIFIED WITH: Nelia Shi RN @1109PM  Thelma Comp [id number]     Performed at [facility]   Report Status PENDING    Randolm Idol, ED     Status: None   Collection Time    01/18/14  1:07 PM      Result Value Ref Range   Troponin i, poc 0.00  0.00 - 0.08 ng/mL   Comment 3            Comment: Due to the release kinetics of cTnI,     a negative result within the first hours     of the onset of symptoms does not rule out     myocardial infarction with certainty.     If myocardial infarction is still suspected,     repeat the test at appropriate intervals.  TSH     Status: None   Collection Time    01/18/14  1:07 PM      Result Value Ref Range   TSH 0.371  0.350 - 4.500 uIU/mL   Comment: Performed at [facility], ED     Status: None   Collection Time    01/18/14  1:10 PM      Result Value Ref Range   Lactic Acid, Venous 2.20  0.5 - 2.2 mmol/L  URINALYSIS, ROUTINE W REFLEX MICROSCOPIC     Status: Abnormal   Collection Time    01/18/14  3:43 PM      Result Value Ref Range   Color, Urine YELLOW  YELLOW   APPearance CLOUDY (*) CLEAR   Specific Gravity,  Urine 1.016  1.005 - 1.030   pH 6.5  5.0 - 8.0   Glucose, UA NEGATIVE  NEGATIVE mg/dL   Hgb urine dipstick TRACE (*) NEGATIVE   Bilirubin Urine NEGATIVE  NEGATIVE   Ketones, ur NEGATIVE  NEGATIVE mg/dL   Protein, ur 30 (*) NEGATIVE mg/dL   Urobilinogen, UA 1.0  0.0 - 1.0 mg/dL   Nitrite NEGATIVE  NEGATIVE   Leukocytes, UA NEGATIVE  NEGATIVE  URINE CULTURE     Status: None   Collection Time    01/18/14  3:43 PM      Result Value Ref Range   Specimen Description URINE, RANDOM     Special Requests NONE     Culture  Setup Time       Value: 01/18/2014 20:05     Performed at [facility]       Value: NO GROWTH     Performed at [facility]   Culture       Value: NO GROWTH     Performed at [facility]   Report Status 01/19/2014 FINAL    URINE RAPID DRUG SCREEN (HOSP PERFORMED)     Status: Abnormal   Collection Time    01/18/14  3:43 PM      Result Value Ref Range   Opiates NONE DETECTED  NONE DETECTED   Cocaine NONE DETECTED  NONE DETECTED   Benzodiazepines POSITIVE (*) NONE DETECTED   Amphetamines NONE DETECTED  NONE DETECTED   Tetrahydrocannabinol NONE DETECTED  NONE DETECTED   Barbiturates NONE DETECTED  NONE DETECTED   Comment:            DRUG SCREEN FOR MEDICAL PURPOSES     ONLY.  IF CONFIRMATION IS NEEDED     FOR ANY PURPOSE, NOTIFY LAB     WITHIN 5 DAYS.                LOWEST DETECTABLE LIMITS     FOR URINE DRUG SCREEN     Drug Class       Cutoff (ng/mL)     Amphetamine      1000     Barbiturate      200     Benzodiazepine   654     Tricyclics       650     Opiates          300     Cocaine          300     THC              50  URINE MICROSCOPIC-ADD ON     Status: None   Collection Time    01/18/14  3:43 PM      Result Value Ref Range   Squamous Epithelial / LPF RARE  RARE   WBC, UA 0-2  <3 WBC/hpf   RBC / HPF 3-6  <3 RBC/hpf   Bacteria, UA RARE  RARE  HIV ANTIBODY (ROUTINE TESTING)     Status: None   Collection Time     01/18/14  5:54 PM      Result Value Ref Range   HIV 1&2 Ab, 4th Generation NONREACTIVE  NONREACTIVE   Comment: (NOTE)     A NONREACTIVE HIV Ag/Ab result does not exclude HIV infection since     the time frame for seroconversion is variable. If acute HIV infection     is suspected, a HIV-1 RNA Qualitative TMA test is recommended.     HIV-1/2 Antibody Diff         Not  indicated.     HIV-1 RNA, Qual TMA           Not indicated.     PLEASE NOTE: This information has been disclosed to you from records     whose confidentiality may be protected by state law. If your state     requires such protection, then the state law prohibits you from making     any further disclosure of the information without the specific written     consent of the person to whom it pertains, or as otherwise permitted     by law. A general authorization for the release of medical or other     information is NOT sufficient for this purpose.     The performance of this assay has not been clinically validated in     patients less than 37 years old.     Performed at [facility]     Status: Abnormal   Collection Time    01/18/14  5:54 PM      Result Value Ref Range   Hemoglobin A1C 5.8 (*) <5.7 %   Comment: (NOTE)                                                                               According to the ADA Clinical Practice Recommendations for 2011, when     HbA1c is used as a screening test:      >=6.5%   Diagnostic of Diabetes Mellitus               (if abnormal result is confirmed)     5.7-6.4%   Increased risk of developing Diabetes Mellitus     References:Diagnosis and Classification of Diabetes Mellitus,Diabetes     BMWU,1324,40(NUUVO 1):S62-S69 and Standards of Medical Care in             Diabetes - 2011,Diabetes ZDGU,4403,47 (Suppl 1):S11-S61.   Mean Plasma Glucose 120 (*) <117 mg/dL   Comment: Performed at [place], URINE     Status: None    Collection Time    01/18/14  6:40 PM      Result Value Ref Range   Specimen Description URINE, RANDOM     Special Requests NONE     Legionella Antigen, Urine       Value: Negative for Legionella pneumophilia serogroup 1     Performed at [facility]   Report Status 01/19/2014 FINAL    STREP PNEUMONIAE URINARY ANTIGEN     Status: None   Collection Time    01/18/14  6:40 PM      Result Value Ref Range   Strep Pneumo Urinary Antigen NEGATIVE  NEGATIVE   Comment:            Infection due to S. pneumoniae     cannot be absolutely ruled out     since the antigen present     may be below the detection limit     of the test.     Performed at [facility]     Status: Abnormal   Collection Time    01/19/14  4:18 AM      Result Value Ref Range   Sodium 140  137 - 147 mEq/L   Potassium 3.3 (*) 3.7 - 5.3 mEq/L   Chloride 104  96 - 112 mEq/L   CO2 22  19 - 32 mEq/L   Glucose, Bld 99  70 - 99 mg/dL   BUN 13  6 - 23 mg/dL   Creatinine, Ser 1.21  0.50 - 1.35 mg/dL   Calcium 9.2  8.4 - 10.5 mg/dL   GFR calc non Af Amer 73 (*) >90 mL/min   GFR calc Af Amer 85 (*) >90 mL/min   Comment: (NOTE)     The eGFR has been calculated using the CKD EPI equation.     This calculation has not been validated in all clinical situations.     eGFR's persistently <90 mL/min signify possible Chronic Kidney     Disease.   Anion gap 14  5 - 15  CBC     Status: Abnormal   Collection Time    01/19/14  4:18 AM      Result Value Ref Range   WBC 14.6 (*) 4.0 - 10.5 K/uL   RBC 3.75 (*) 4.22 - 5.81 MIL/uL   Hemoglobin 10.9 (*) 13.0 - 17.0 g/dL   Comment: DELTA CHECK NOTED     REPEATED TO VERIFY   HCT 33.1 (*) 39.0 - 52.0 %   MCV 88.3  78.0 - 100.0 fL   MCH 29.1  26.0 - 34.0 pg   MCHC 32.9  30.0 - 36.0 g/dL   RDW 15.0  11.5 - 15.5 %   Platelets 417 (*) 150 - 400 K/uL   Comment: DELTA CHECK NOTED     REPEATED TO VERIFY  BASIC METABOLIC PANEL     Status: Abnormal    Collection Time    01/20/14  1:00 PM      Result Value Ref Range   Sodium 141  137 - 147 mEq/L   Potassium 3.5 (*) 3.7 - 5.3 mEq/L   Chloride 103  96 - 112 mEq/L   CO2 25  19 - 32 mEq/L   Glucose, Bld 103 (*) 70 - 99 mg/dL   BUN 7  6 - 23 mg/dL   Creatinine, Ser 1.09  0.50 - 1.35 mg/dL   Calcium 9.3  8.4 - 10.5 mg/dL   GFR calc non Af Amer 83 (*) >90 mL/min   GFR calc Af Amer >90  >90 mL/min   Comment: (NOTE)     The eGFR has been calculated using the CKD EPI equation.     This calculation has not been validated in all clinical situations.     eGFR's persistently <90 mL/min signify possible Chronic Kidney     Disease.   Anion gap 13  5 - 15  CBC     Status: Abnormal   Collection Time    01/20/14  1:00 PM      Result Value Ref Range   WBC 11.0 (*) 4.0 - 10.5 K/uL   RBC 3.74 (*) 4.22 - 5.81 MIL/uL   Hemoglobin 11.0 (*) 13.0 - 17.0 g/dL   HCT 33.0 (*) 39.0 - 52.0 %   MCV 88.2  78.0 - 100.0 fL   MCH 29.4  26.0 - 34.0 pg   MCHC 33.3  30.0 - 36.0 g/dL   RDW 14.8  11.5 - 15.5 %   Platelets 444 (*) 150 - 400 K/uL  MAGNESIUM     Status: None  Collection Time    01/20/14  1:00 PM      Result Value Ref Range   Magnesium 2.0  1.5 - 2.5 mg/dL  VANCOMYCIN, TROUGH     Status: Abnormal   Collection Time    01/20/14  1:15 PM      Result Value Ref Range   Vancomycin Tr 22.1 (*) 10.0 - 20.0 ug/mL   Labs are reviewed and are pertinent for elevated wbc  Current Facility-Administered Medications  Medication Dose Route Frequency Provider Last Rate Last Dose  . acetaminophen (TYLENOL) tablet 650 mg  650 mg Oral Q6H PRN Annita Brod, MD       Or  . acetaminophen (TYLENOL) suppository 650 mg  650 mg Rectal Q6H PRN Annita Brod, MD      . amLODipine (NORVASC) tablet 5 mg  5 mg Oral Daily Annita Brod, MD   5 mg at 01/20/14 0943  . enoxaparin (LOVENOX) injection 40 mg  40 mg Subcutaneous Q24H Annita Brod, MD   40 mg at 01/19/14 2007  . folic acid (FOLVITE) tablet 1 mg  1  mg Oral Daily Annita Brod, MD   1 mg at 01/20/14 0943  . haloperidol (HALDOL) tablet 5 mg  5 mg Oral TID Levonne Spiller, MD   5 mg at 01/20/14 0943  . haloperidol lactate (HALDOL) injection 5 mg  5 mg Intravenous Q6H PRN Orson Eva, MD   5 mg at 01/19/14 1511  . LORazepam (ATIVAN) injection 0-4 mg  0-4 mg Intravenous Q6H Annita Brod, MD   1 mg at 01/20/14 1307   Followed by  . LORazepam (ATIVAN) injection 0-4 mg  0-4 mg Intravenous Q12H Annita Brod, MD      . LORazepam (ATIVAN) tablet 1 mg  1 mg Oral Q6H PRN Annita Brod, MD       Or  . LORazepam (ATIVAN) injection 1 mg  1 mg Intravenous Q6H PRN Annita Brod, MD   1 mg at 01/18/14 1721  . multivitamin with minerals tablet 1 tablet  1 tablet Oral Daily Annita Brod, MD   1 tablet at 01/20/14 [phone]  . ondansetron (ZOFRAN) tablet 4 mg  4 mg Oral Q6H PRN Annita Brod, MD       Or  . ondansetron [facility]) injection 4 mg  4 mg Intravenous Q6H PRN Annita Brod, MD      . oxyCODONE (Oxy IR/ROXICODONE) immediate release tablet 5 mg  5 mg Oral Q4H PRN Annita Brod, MD      . piperacillin-tazobactam (ZOSYN) IVPB 3.375 g  3.375 g Intravenous 3 times per day Minda Ditto, RPH   3.375 g at 01/20/14 1309  . sodium chloride 0.9 % 1,000 mL with potassium chloride 20 mEq infusion   Intravenous Continuous Orson Eva, MD 75 mL/hr at 01/20/14 0543    . thiamine (VITAMIN B-1) tablet 100 mg  100 mg Oral Daily Annita Brod, MD   100 mg at 01/20/14 0981   Or  . thiamine (B-1) injection 100 mg  100 mg Intravenous Daily Annita Brod, MD      . Derrill Memo ON 01/21/2014] vancomycin (VANCOCIN) 1,250 mg in sodium chloride 0.9 % 250 mL IVPB  1,250 mg Intravenous Q12H Julio Sicks, [facility]        Psychiatric Specialty Exam: Physical Exam  Constitutional: He appears well-developed and well-nourished.  HENT:  Head: Normocephalic.  Eyes: Pupils are equal, round, and reactive  to light.  Neck: Normal range of motion. Neck supple.   Musculoskeletal: Normal range of motion.  Neurological: He is alert.  Skin: Skin is warm and dry.    Review of Systems  HENT: Negative.   Eyes: Negative.   Respiratory: Positive for wheezing.   Cardiovascular: Negative.   Gastrointestinal: Negative.   Genitourinary: Negative.   Skin: Negative.   Neurological: Positive for weakness.  Endo/Heme/Allergies: Negative.   Psychiatric/Behavioral: Positive for hallucinations and substance abuse. The patient is nervous/anxious.     Blood pressure 116/87, pulse 109, temperature 97.4 F (36.3 C), temperature source Oral, resp. rate 18, height 5' 10"  (1.778 m), weight 199 lb 4.7 oz (90.4 kg), SpO2 99.00%.Body mass index is 28.6 kg/(m^2).  General Appearance: Casual  Eye Contact::  Poor  Speech:  Garbled  Volume:  decreased  Mood:  drowsy  Affect:  constricted  Thought Process:  Disorganized  Orientation:  Other:  not oriented to place  Thought Content:  Hallucinations: Visual and Paranoid Ideation  Suicidal Thoughts:  No  Homicidal Thoughts:  No  Memory:  Immediate;   Poor Recent;   Poor Remote;   Poor  Judgement:  Impaired  Insight:  Lacking  Psychomotor Activity:  Decreased  Concentration:  Poor  Recall:  Poor  Fund of Knowledge:Fair  Language: Fair  Akathisia:  No  Handed:  Right  AIMS (if indicated):     Assets:  Communication Skills Housing Social Support  Sleep:      Musculoskeletal: Strength & Muscle Tone: within normal limits Gait & Station: unsteady Patient leans: N/A  Treatment Plan Summary: Daily contact with patient to assess and evaluate symptoms and progress in treatment Medication management Patient seems drowsy and overmedicated so will decrease Haldol dose Please call psychiatry tomorrow for further follow up [place], [facility] 01/20/2014 3:16 PM

## 2014-01-20 NOTE — Progress Notes (Signed)
During bedside report, patient  was curt and belligerently yelling "why can't I go home." Writer tried to explain to pt reason for his admission, but patient continued to talk over me, cursing, and saying everyone here is full of shit and stupid." patient demanded to see a doctor "NOW or it would get ugly in here." Kathline Magic, NP notified and is at patient bedside. Will continue to monitor.

## 2014-01-20 NOTE — Progress Notes (Signed)
PROGRESS NOTE  Demetrion Wesby GYK:[id number] DOB: 1971/03/09 DOA: 01/18/2014 PCP: No PCP Per Patient  Interim summary  43 year old male with a history of hypertension, pneumonia, tobacco abuse, alcohol abuse and polysubstance abuse who had 2 previous admissions to the hospital in August 2015. On his admission from 12/20/2013 through 12/25/2013 the patient had an elevated troponin and underwent cardiac catheterization on 12/24/2013 which showed clean coronaries. During that hospitalization, the patient also had aspiration pneumonitis and pulmonary edema. He was discharged with 5 days of Augmentin. The patient was re-admitted on 12/31/2013 until he left [place] on 01/11/2014. During that admission, the patient had acute respiratory failure secondary to pneumonia with loculated pleural effusion, and he required placement of a right-sided chest tube on 01/01/2014 he had his pigtail catheter removed on August 23. He also had a thoracocentesis 01/04/2014 due to residual loculated collection in the right lung that was not drained by the initial chest tube.. Because of persistent pleural fluid, another R-chest tube was placed by IR on 01/08/2014. He was treated with initially with vancomycin and cefepime. After initially 4 days of vancomycin, cefepime was continued through the hospitalization. All his cultures including blood culture and pleural fluid cultures were negative, and cytology on 01/04/2014 was negative for malignancy. His second chest tube was ultimately discontinued on 01/11/2014 and he was transitioned to oral levofloxacin. Pulmonary followup was set up with Dr. Halford Chessman for 02/01/14@noon . The patient represented to the emergency department on 01/18/2014 because of confusion. His urine drug screen showed only benzodiazepines, and his alcohol level was negative. The patient was brought to the emergency department by the [facility] and paramedics where he was quite confused and  found to have a questionable right lower lobe infiltrate with WBC 20.7 and tachycardia.  Assessment/Plan:  Sepsis  -Present at the time of admission  -Secondary to HCAP/empyema  -Continue vancomycin and Zosyn for now which were started at the time of this admission  -continue IVF  -adjust abx pending culture data  HCAP/Empyema  -The patient left AGAINST MEDICAL ADVICE on 01/11/2014  -It is unclear whether the patient took any additional antibiotics after he left the hospital  -He had received cefepime from 12/31/2013 to 01/11/2014  -Continue IV antibiotics  -Flutter valve  -Bronchial hygiene  -Bronchodilators  Bacteremia -GPC in one of two sets -continue vancomycin pending further identification Acute encephalopathy  -Secondary to sepsis and drug use  -unclear what the patient's baseline is--patient less aggressive and belligerent today  -alcohol withdrawal protocol  -I consulted psychiatry for substance abuse induced psychosis and capacity evaluation  -add Haldol for aggitation - appreciate psychiatry followup -01/20/2014 Haldol dose decreased AKI  -improving -Secondary to sepsis and hypovolemia  -Continue IV fluids  -Baseline creatinine 0.6-0.8  impaired glucose tolerance  -Hemoglobin A1c 5.8  -patient will not be cooperative with CBGs and insulin at this time  Hypertension  -Continue amlodipine  Polysubstance abuse  -Tobacco cessation discussed  -Check HIV--neg -Hep B and C neg  Hypokalemia  -Replete  -Check magnesium  Family Communication: When asked if I can contact any family, he states--what difference does it make? Am I dying?  Disposition Plan: Home when medically stable  Antibiotics:  Vancomycin 01/18/14>>>  Zosyn 01/18/14>>>   Procedures/Studies: Dg Chest 2 View  12/31/2013   CLINICAL DATA:  Pneumonia.  Recent heart attack.  Smoker  EXAM: CHEST  2 VIEW  COMPARISON:  12/24/2013  FINDINGS: The heart size appears normal.  There is been significant interval  decrease in aeration to right lung which is felt a likely represent a combination of large pleural effusion and associated atelectasis. Pulmonary vascular congestion is noted.  IMPRESSION: 1. Interval development of large right pleural effusion with associated compressive type atelectasis of the right lung.   Electronically Signed   By: Kerby Moors M.D.   On: 12/31/2013 19:17   Dg Chest 2 View  12/24/2013   CLINICAL DATA:  Evaluate for a rib fracture. Right chest pain and shortness of breath.  EXAM: CHEST  2 VIEW  COMPARISON:  12/21/2013  FINDINGS: Two views of the chest were obtained. Increased densities at the right lung base are compatible with airspace disease and pleural fluid. No evidence for a pneumothorax. Few densities at the left lung base but no significant airspace disease on the left side. There is fullness in the right hilar region which could be related to atelectasis or pleural fluid. Heart size is stable. Bony thorax is intact. Please note that dedicated rib images were not obtained.  IMPRESSION: Increased densities at the right lung base are related to airspace disease and pleural fluid.   Electronically Signed   By: Markus Daft M.D.   On: 12/24/2013 08:39   Ct Chest W Contrast  01/04/2014   CLINICAL DATA:  Pleural effusion  EXAM: CT CHEST WITH CONTRAST  TECHNIQUE: Multidetector CT imaging of the chest was performed during intravenous contrast administration.  CONTRAST:  74mL OMNIPAQUE IOHEXOL 300 MG/ML  SOLN  COMPARISON:  Chest CT December 31, 2013 and chest radiograph January 04, 2014  FINDINGS: Overall, the right-sided effusion is significantly smaller than on recent chest CT, status post chest tube placement. Note that the tube tip is in the right upper hemi thorax or fluid is drain. There is pleural effusion, moderate, in the right base which is not connected to the chest tube and remains. Two loculated fluid collections are seen adjacent to the right heart border. There is moderate  consolidation in the right lower lobe.  There is mild left base atelectatic change. Left lung is otherwise clear.  There is no appreciable thoracic adenopathy. Pericardium is not thickened.  In the visualized upper abdomen, there is fatty liver. Visualized upper abdominal structures otherwise appear normal. There are no blastic or lytic bone lesions. Visualized thyroid appears unremarkable.  IMPRESSION: There has been drainage of most of the loculated effusion from the upper right hemithorax due to the chest tube which is present in this area. Pleural fluid in the right base which appears at least partially free in partially loculated remains. There are two loculated fluid collections which abuts the right heart border which also remain and are separate from the chest tube. There is patchy consolidation throughout much of the right lower lobe. Left lung is clear except for rather minimal left base atelectatic change.  There is fatty liver.   Electronically Signed   By: Lowella Grip M.D.   On: 01/04/2014 19:08   Ct Angio Chest Pe W/cm &/or Wo Cm  12/31/2013   CLINICAL DATA:  Shortness of breath.  Large right pleural effusion.  EXAM: CT ANGIOGRAPHY CHEST WITH CONTRAST  TECHNIQUE: Multidetector CT imaging of the chest was performed using the standard protocol during bolus administration of intravenous contrast. Multiplanar CT image reconstructions and MIPs were obtained to evaluate the vascular anatomy.  CONTRAST:  147mL OMNIPAQUE IOHEXOL 350 MG/ML SOLN  COMPARISON:  Chest x-ray, same date.  FINDINGS: Chest wall:  No chest  wall mass, supraclavicular or axillary lymphadenopathy. The thyroid gland is normal. The bony thorax is intact. No destructive bone lesions or spinal canal compromise. The sternum is intact.  Mediastinum:  The heart is normal in size. No mediastinal or hilar mass or adenopathy. Borderline scattered lymph nodes. The esophagus is grossly normal. The aorta is normal in caliber. No dissection.   Lungs:  There is a large loculated appearing right pleural effusion occupying a good portion of the right hemi thorax. There is severe compressive atelectasis of the right long which is near complete except for the right upper lobe. No endobronchial lesion or obvious bronchial obstruction. I do not see any obvious enhancement of the pleural to suggest empyema. No pleural nodularity. The left lung is clear except because of the large pleural effusion there is mild mass effect on the heart and mediastinum to the left.  Upper abdomen:  Unremarkable.  Review of the MIP images confirms the above findings.  IMPRESSION: 1. Large loculated appearing right pleural effusion with near complete compressive atelectasis of the right lung. There is also mass effect on the heart and mediastinal structures which are shifted to the left. I do not see any obvious pleural enhancement but empyema is a possibility. 2. No mediastinal or hilar mass or adenopathy.   Electronically Signed   By: Kalman Jewels M.D.   On: 12/31/2013 21:30   Mr Lumbar Spine Wo Contrast  01/07/2014   CLINICAL DATA:  LEFT leg burning and weakness. Evaluate for possible abscess.  EXAM: MRI LUMBAR SPINE WITHOUT CONTRAST  TECHNIQUE: Multiplanar, multisequence MR imaging of the lumbar spine was performed. No intravenous contrast was administered.  COMPARISON:  CT lumbar spine performed 12/20/2013.  FINDINGS: Anatomic alignment. Normal conus. Hypercellular marrow could indicate chronic disease, smoking, or be related to increased body habitus. No focal areas of marrow replacement.  No appreciable asymmetry of size or signal of the psoas muscles.  Normal conus.  No intraspinal mass lesion.  At L1-2, there are Schmorl's nodes with mild annular bulging but no impingement.  At L2-3, there is mild annular bulging and mild facet arthropathy without impingement.  The L3-4, and L4-5 disc spaces appear unremarkable.  At L5-S1, there is a tiny annular fissure in the LEFT  foramen without frank disc protrusion. Transitional anatomy with partially fused facet joint on the RIGHT at this level.  IMPRESSION: Tiny annular fissure at L5-S1 on the LEFT. It is unclear if this could be symptomatic.  No appreciable asymmetry in size or signal of the psoas muscles.   Electronically Signed   By: Rolla Flatten M.D.   On: 01/07/2014 20:49   Dg Chest Port 1 View  01/18/2014   CLINICAL DATA:  Tachycardia, chest pain, shortness of breath, unspecified drug use  EXAM: PORTABLE CHEST - 1 VIEW  COMPARISON:  Portable exam 1348 hr compared to 01/12/2014  FINDINGS: Enlargement of cardiac silhouette with slight pulmonary vascular congestion.  Mediastinal contours stable.  Peribronchial thickening with minimal RIGHT basilar atelectasis versus infiltrate little changed.  Remaining lungs clear.  No pleural effusion or pneumothorax.  IMPRESSION: Bronchitic changes with persistent atelectasis versus infiltrate at RIGHT base.   Electronically Signed   By: Lavonia Dana M.D.   On: 01/18/2014 13:59   Dg Chest Port 1 View  01/11/2014   CLINICAL DATA:  Pneumonia.  Right-sided chest tube.  EXAM: PORTABLE CHEST - 1 VIEW  COMPARISON:  01/10/2014.  FINDINGS: Pigtail catheter in the inferior right hemi thorax is stable. There  is surrounding right lung base consolidation. The lungs show diffuse irregular interstitial densities, also unchanged. No pneumothorax.  Cardiac silhouette is mildly enlarged.  IMPRESSION: No change from the previous day's study.  No pneumothorax.   Electronically Signed   By: Lajean Manes M.D.   On: 01/11/2014 07:14   Dg Chest Port 1 View  01/10/2014   CLINICAL DATA:  Pneumonia. Right pleural effusion. Right chest tube.  EXAM: PORTABLE CHEST - 1 VIEW  COMPARISON:  01/09/2014.  FINDINGS: Right chest tube in stable position. Mediastinum and hilar structures are stable. Stable cardiomegaly with pulmonary venous congestion and bilateral interstitial prominence consistent with congestive heart  failure. Stable small right pleural effusion. No pneumothorax. No acute osseus abnormality.  IMPRESSION: 1. Right chest tube in stable position. Stable small right pleural effusion. 2. Stable changes of congestive heart failure and pulmonary interstitial edema.   Electronically Signed   By: Marcello Moores  [place]   On: 01/10/2014 07:46   Dg Chest Port 1 View  01/09/2014   CLINICAL DATA:  Pleural effusion  EXAM: PORTABLE CHEST - 1 VIEW  COMPARISON:  January 06, 2014  FINDINGS: There is a chest tube on the right with significant interval resolution of pleural effusion. A small pleural effusion remains on the right. There is moderate generalized interstitial edema with cardiomegaly. No airspace consolidation. No bony lesions. No apparent pneumothorax.  IMPRESSION: Chest tube at right base. There is evidence of congestive heart failure with edema and cardiomegaly. There is a small residual effusion on the right. No pneumothorax apparent   Electronically Signed   By: Lowella Grip M.D.   On: 01/09/2014 07:00   Dg Chest Port 1 View  01/06/2014   CLINICAL DATA:  Status post right chest tube removal  EXAM: PORTABLE CHEST - 1 VIEW  COMPARISON:  Portable chest x-ray of January 04, 2014  FINDINGS: There has been interval removal of the small caliber chest tube on the right. There remains pleural thickening or fluid along the right lateral thoracic wall and at the right lung base. There is no pneumothorax. The left lung is adequately inflated and grossly clear. The cardiac silhouette is top-normal in size but stable. The central pulmonary vascularity is prominent but also stable.  IMPRESSION: There is no evidence of significant additional pleural fluid reaccumulation nor development of a pneumothorax since removal of the right-sided chest tube.   Electronically Signed   By: Astraea Gaughran  [place]   On: 01/06/2014 12:22   Dg Chest Port 1 View  01/04/2014   CLINICAL DATA:  Shortness of breath, cough, large pleural effusion  EXAM:  PORTABLE CHEST - 1 VIEW  COMPARISON:  Portable exam 0948 hr compared to 0515 hr  FINDINGS: Pigtail RIGHT thoracostomy tube unchanged.  Enlargement of cardiac silhouette.  Mediastinal contours and pulmonary vascularity normal.  Persistent RIGHT pleural effusion and basilar atelectasis.  Lungs otherwise clear.  No pneumothorax or LEFT pleural effusion identified.  IMPRESSION: Persistent RIGHT pleural effusion post thoracostomy tube placement.  Persistent atelectasis RIGHT base, little changed from earlier study.   Electronically Signed   By: Lavonia Dana M.D.   On: 01/04/2014 09:55   Dg Chest Port 1 View  01/04/2014   CLINICAL DATA:  Followup pleural effusion. Status post pigtail drainage.  EXAM: PORTABLE CHEST - 1 VIEW  COMPARISON:  12/2013.  FINDINGS: Cardiac enlargement persists. Pigtail catheter missing good position. Considerable effusion remains inferior to the location of the drainage catheter. There is consolidation and atelectasis at the RIGHT mid and  lower lung zones. The bones are unremarkable.  IMPRESSION: Stable chest. Considerable right-sided pleural effusion remains, unchanged from 01/03/2014.   Electronically Signed   By: Rolla Flatten M.D.   On: 01/04/2014 07:18   Dg Chest Port 1 View  01/03/2014   CLINICAL DATA:  Right pleural effusion  EXAM: PORTABLE CHEST - 1 VIEW  COMPARISON:  Chest x-ray from yesterday  FINDINGS: Significant decrease in a right pleural effusion. The pleural fluid volume is now moderate, located along the lower and lateral right chest. Some fissural fluid is present. There is no overt re-expansion pulmonary edema. There has been relatively good re-expansion of the right lung. Streaky retrocardiac opacity, similar to previous.  Unchanged cardiomegaly. Stable upper mediastinal contours, likely with loculated pleural fluid along the peritracheal stripe.  IMPRESSION: Significant decrease in right pleural effusion with good re-expansion of the right lung.   Electronically Signed    By: Jorje Guild M.D.   On: 01/03/2014 05:34   Dg Chest Port 1 View  01/02/2014   CLINICAL DATA:  Right chest tube placement  EXAM: PORTABLE CHEST - 1 VIEW  COMPARISON:  01/02/2014  FINDINGS: Large right pleural effusion is unchanged. Right lower lobe is collapsed. Small amount of aerated right upper lobe unchanged. Pigtail catheter overlying the right chest is unchanged. No pneumothorax.  Left lung is clear.  Cardiac enlargement.  IMPRESSION: Large right effusion and collapse of the right lower lobe unchanged. Right pigtail catheter unchanged in position.   Electronically Signed   By: Franchot Gallo M.D.   On: 01/02/2014 14:24   Dg Chest Port 1 View  01/02/2014   CLINICAL DATA:  Right pleural drainage catheter  EXAM: PORTABLE CHEST - 1 VIEW  COMPARISON:  Chest x-ray from yesterday  FINDINGS: A large right pleural effusion, which is loculated by CT, shows slight interval decrease in volume. Leftward mediastinal shift persists. Congested appearance of the left lung vessels likely related to extensive lung compression on the right. A right-sided pleural drainage catheter is in unchanged position, located at the mid to upper chest level. Stable heart size and mediastinal contours.  IMPRESSION: Mild decrease in a still large right pleural effusion.   Electronically Signed   By: Jorje Guild M.D.   On: 01/02/2014 05:44   Dg Chest Port 1 View  01/01/2014   CLINICAL DATA:  Chest tube placement.  EXAM: PORTABLE CHEST - 1 VIEW  COMPARISON:  CT 12/31/2013  FINDINGS: Right chest tube noted over the right upper chest. Prominent right pleural effusion remains. No pneumothorax. Left lung is clear. Cardiac structures are unremarkable. No acute bony abnormality.  IMPRESSION: Interim placement of right chest tube, its tip is projected over the right upper chest. Persistent large right pleural effusion.   Electronically Signed   By: [place]   On: 01/01/2014 10:18   Dg Knee Left Port  01/02/2014   CLINICAL  DATA:  Left knee pain after fall.  EXAM: PORTABLE LEFT KNEE - 1-2 VIEW  COMPARISON:  December 20, 2013.  FINDINGS: There is no evidence of fracture, dislocation, or joint effusion. There is no evidence of arthropathy or other focal bone abnormality. Soft tissues are unremarkable.  IMPRESSION: Normal left knee.   Electronically Signed   By: Sabino Dick M.D.   On: 01/02/2014 11:42   Ct Image Guided Fluid Drain By Catheter  01/08/2014   CLINICAL DATA:  Loculated right pleural effusion  EXAM: CT-GUIDED RIGHT POSTERIOR 10 FRENCH CHEST TUBE INSERTION  Date:  8/25/20158/25/2015  2:05 pm  Radiologist:  M. Daryll Brod, MD  Guidance:  CT  FLUOROSCOPY TIME:  None.  MEDICATIONS AND MEDICAL HISTORY: 2 mg Versed, 50 mcg fentanyl  ANESTHESIA/SEDATION: 15 min  CONTRAST:  None.  COMPLICATIONS: No immediate  PROCEDURE: Informed consent was obtained from the patient following explanation of the procedure, risks, benefits and alternatives. The patient understands, agrees and consents for the procedure. All questions were addressed. A time out was performed.  Maximal barrier sterile technique utilized including caps, mask, sterile gowns, sterile gloves, large sterile drape, hand hygiene, and Betadine.  Previous imaging reviewed. Patient positioned right anterior oblique. Noncontrast localization CT performed. The posterior loculated right pleural effusion was localized. Under sterile conditions and local anesthesia, an 18 gauge introducer needle was advanced from a lateral intercostal approach into the fluid collection. Needle position confirmed with CT. Syringe aspiration yielded pleural fluid. Guidewire advanced followed by dilatation to insert a 10 [place] drain. Catheter position confirmed with CT. No immediate complication. Patient tolerated the procedure well. Catheter secured with a Prolene suture and connected to external pleura vac.  IMPRESSION: Successful CT-guided 10 French right chest drain insertion for a loculated pleural  effusion.   Electronically Signed   By: Daryll Brod M.D.   On: 01/08/2014 15:27         Subjective: Patient is less curt and belligerent today remains pleasantly confused at times. Denies any headache, chest pain, shortness breath, nausea, vomiting, diarrhea, abdominal pain  Objective: Filed Vitals:   01/19/14 0421 01/19/14 2231 01/20/14 0626 01/20/14 1523  BP: 138/86 134/79 116/87 137/89  Pulse: 111 102 109 97  Temp: 98.1 F (36.7 C) 98.3 F (36.8 C) 97.4 F (36.3 C) 98.2 F (36.8 C)  TempSrc: Oral Oral Oral Oral  Resp: 20 20 18 18   Height:      Weight:      SpO2: 96% 98% 99% 98%    Intake/Output Summary (Last 24 hours) at 01/20/14 1707 Last data filed at 01/20/14 1015  Gross per 24 hour  Intake   2510 ml  Output    900 ml  Net   1610 ml   Weight change:  Exam:   General:  Pt is alert, follows commands appropriately, not in acute distress  HEENT: No icterus, No thrush, No neck mass, [place]/AT  Cardiovascular: RRR, S1/S2, no rubs, no gallops  Respiratory: Bibasilar rales, right greater than left. No wheezing.  Abdomen: Soft/+BS, non tender, non distended, no guarding  Extremities: No edema, No lymphangitis, No petechiae, No rashes, no synovitis  Data Reviewed: Basic Metabolic Panel:  Recent Labs Lab 01/18/14 1306 01/19/14 0418 01/20/14 1300  NA 139 140 141  K 3.4* 3.3* 3.5*  CL 97 104 103  CO2 21 22 25   GLUCOSE 185* 99 103*  BUN 15 13 7   CREATININE 1.48* 1.21 1.09  CALCIUM 10.6* 9.2 9.3  MG  --   --  2.0   Liver Function Tests:  Recent Labs Lab 01/18/14 1306  AST 25  ALT 23  ALKPHOS 100  BILITOT 0.7  PROT 8.8*  ALBUMIN 3.6   No results found for this basename: LIPASE, AMYLASE,  in the last 168 hours No results found for this basename: AMMONIA,  in the last 168 hours CBC:  Recent Labs Lab 01/18/14 1306 01/19/14 0418 01/20/14 1300  WBC 20.7* 14.6* 11.0*  NEUTROABS 18.3*  --   --   HGB 13.4 10.9* 11.0*  HCT 39.3 33.1* 33.0*  MCV  88.3 88.3 88.2  PLT 610* 417* 444*   Cardiac Enzymes: No results found for this basename: CKTOTAL, CKMB, CKMBINDEX, TROPONINI,  in the last 168 hours BNP: No components found with this basename: POCBNP,  CBG:  Recent Labs Lab 01/18/14 1243  GLUCAP 192*    Recent Results (from the past 240 hour(s))  CULTURE, BLOOD (ROUTINE X 2)     Status: None   Collection Time    01/18/14  1:04 PM      Result Value Ref Range Status   Specimen Description BLOOD RIGHT ANTECUBITAL   Final   Special Requests BOTTLES DRAWN AEROBIC AND ANAEROBIC 5ML   Final   Culture  Setup Time     Final   Value: 01/18/2014 20:45     Performed at [facility]   Culture     Final   Value:        BLOOD CULTURE RECEIVED NO GROWTH TO DATE CULTURE WILL BE HELD FOR 5 DAYS BEFORE ISSUING A FINAL NEGATIVE REPORT     Performed at [facility]   Report Status PENDING   Incomplete  CULTURE, BLOOD (ROUTINE X 2)     Status: None   Collection Time    01/18/14  1:07 PM      Result Value Ref Range Status   Specimen Description BLOOD RIGHT ARM   Final   Special Requests BOTTLES DRAWN AEROBIC ONLY 1ML   Final   Culture  Setup Time     Final   Value: 01/18/2014 20:45     Performed at [facility]   Culture     Final   Value: GRAM POSITIVE COCCI IN CLUSTERS     Note: CRITICAL RESULT CALLED TO, READ BACK BY AND VERIFIED WITH: Nelia Shi RN @1109PM  Thelma Comp [id number]     Performed at [facility]   Report Status PENDING   Incomplete  URINE CULTURE     Status: None   Collection Time    01/18/14  3:43 PM      Result Value Ref Range Status   Specimen Description URINE, RANDOM   Final   Special Requests NONE   Final   Culture  Setup Time     Final   Value: 01/18/2014 20:05     Performed at [facility]     Final   Value: NO GROWTH     Performed at [facility]   Culture     Final   Value: NO GROWTH     Performed at [facility]   Report Status 01/19/2014 FINAL    Final     Scheduled Meds: . amLODipine  5 mg Oral Daily  . enoxaparin (LOVENOX) injection  40 mg Subcutaneous Q24H  . folic acid  1 mg Oral Daily  . haloperidol  2 mg Oral TID  . LORazepam  0-4 mg Intravenous Q6H   Followed by  . LORazepam  0-4 mg Intravenous Q12H  . multivitamin with minerals  1 tablet Oral Daily  . piperacillin-tazobactam (ZOSYN)  IV  3.375 g Intravenous 3 times per day  . thiamine  100 mg Oral Daily   Or  . thiamine  100 mg Intravenous Daily  . [START ON 01/21/2014] vancomycin  1,250 mg Intravenous Q12H   Continuous Infusions: . sodium chloride 0.9 % 1,000 mL with potassium chloride 20 mEq infusion 75 mL/hr at 01/20/14 0543     Yechiel Erny, DO  [facility] Pager [phone]  If  7PM-7AM, please contact night-coverage [email or web address] Password [email or web address] 01/20/2014, 5:07 PM   LOS: 2 days

## 2014-01-21 LAB — GLUCOSE, CAPILLARY: Glucose-Capillary: 163 mg/dL — ABNORMAL HIGH (ref 70–99)

## 2014-01-21 LAB — CBC
HCT: 32.5 % — ABNORMAL LOW (ref 39.0–52.0)
Hemoglobin: 10.7 g/dL — ABNORMAL LOW (ref 13.0–17.0)
MCH: 29.2 pg (ref 26.0–34.0)
MCHC: 32.9 g/dL (ref 30.0–36.0)
MCV: 88.6 fL (ref 78.0–100.0)
PLATELETS: 367 10*3/uL (ref 150–400)
RBC: 3.67 MIL/uL — ABNORMAL LOW (ref 4.22–5.81)
RDW: 14.8 % (ref 11.5–15.5)
WBC: 10.7 10*3/uL — AB (ref 4.0–10.5)

## 2014-01-21 LAB — BASIC METABOLIC PANEL
ANION GAP: 12 (ref 5–15)
BUN: 5 mg/dL — ABNORMAL LOW (ref 6–23)
CALCIUM: 9.3 mg/dL (ref 8.4–10.5)
CO2: 25 mEq/L (ref 19–32)
Chloride: 105 mEq/L (ref 96–112)
Creatinine, Ser: 1.06 mg/dL (ref 0.50–1.35)
GFR calc non Af Amer: 86 mL/min — ABNORMAL LOW (ref >90)
Glucose, Bld: 99 mg/dL (ref 70–99)
Potassium: 3.5 mEq/L — ABNORMAL LOW (ref 3.7–5.3)
SODIUM: 142 meq/L (ref 137–147)

## 2014-01-21 LAB — CULTURE, BLOOD (ROUTINE X 2)

## 2014-01-21 MED ORDER — LORAZEPAM 2 MG/ML IJ SOLN
2.0000 mg | Freq: Four times a day (QID) | INTRAMUSCULAR | Status: DC | PRN
  Administered 2014-01-25 – 2014-01-29 (×6): 2 mg via INTRAMUSCULAR
  Filled 2014-01-21 (×6): qty 1

## 2014-01-21 MED ORDER — HALOPERIDOL LACTATE 5 MG/ML IJ SOLN
5.0000 mg | Freq: Four times a day (QID) | INTRAMUSCULAR | Status: DC | PRN
  Administered 2014-01-24 – 2014-01-29 (×7): 5 mg via INTRAVENOUS
  Filled 2014-01-21 (×8): qty 1

## 2014-01-21 MED ORDER — LEVOFLOXACIN 750 MG PO TABS
750.0000 mg | ORAL_TABLET | ORAL | Status: DC
  Administered 2014-01-21 – 2014-01-23 (×3): 750 mg via ORAL
  Filled 2014-01-21 (×4): qty 1

## 2014-01-21 MED ORDER — LORAZEPAM 2 MG/ML IJ SOLN: 2.0000 mg | Freq: Four times a day (QID) | INTRAMUSCULAR | Status: DC | PRN

## 2014-01-21 MED ORDER — LORAZEPAM 1 MG PO TABS
2.0000 mg | ORAL_TABLET | Freq: Four times a day (QID) | ORAL | Status: DC | PRN
  Administered 2014-01-21 – 2014-01-30 (×11): 2 mg via ORAL
  Filled 2014-01-21 (×11): qty 2

## 2014-01-21 MED ORDER — HALOPERIDOL LACTATE 5 MG/ML IJ SOLN: 5.0000 mg | Freq: Four times a day (QID) | INTRAMUSCULAR | Status: DC | PRN

## 2014-01-21 MED ORDER — HALOPERIDOL LACTATE 5 MG/ML IJ SOLN
5.0000 mg | Freq: Four times a day (QID) | INTRAMUSCULAR | Status: DC | PRN
  Administered 2014-01-22 – 2014-01-24 (×3): 5 mg via INTRAMUSCULAR
  Filled 2014-01-21 (×3): qty 1

## 2014-01-21 NOTE — Progress Notes (Signed)
[facility] MD Progress Note  01/21/2014 2:29 PM Ralph Chavez  MRN:  [id number]  Subjective:  Patient is seen for psych consultation follow up. Patient is admitted to [place] floor with altered mental status, confused, talking to himself, paranoid and has long history of substance abuse and non compliance with medication treatment. Patient t is a 43 year old white male who lives alone in [place]. His girlfriend Ralph Chavez reported he has been belligerent and confused recently. He admits recent use of cocaine and crystal meth 3 days ago. Drug screen only positive for benzodiazepines. He has denied alcohol abuse. He has prior history of substance abuse treatment and living in halfway house 2 years ago. He is somewhat lethargic, irritable and using cursing words.   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 rate 20, height 5' 10"  (1.778 m), weight 90.4 kg (199 lb 4.7 oz), SpO2 97.00%.Body mass index is 28.6 kg/(m^2).  General Appearance: Guarded  Eye Contact::  Minimal  Speech:  Clear and Coherent, Slow and Slurred  Volume:  Decreased  Mood:  Depressed and Irritable  Affect:  Depressed, Inappropriate and Labile  Thought Process:  Coherent and Loose  Orientation:  Full (Time, Place, and Person)  Thought Content:  Rumination  Suicidal Thoughts:  No  Homicidal Thoughts:  No  Memory:  Immediate;   Fair Recent;   Fair  Judgement:  Impaired   Insight:  Lacking  Psychomotor Activity:  Increased and Restlessness  Concentration:  Poor  Recall:  Poor  Fund of Knowledge:Fair  Language: Fair  Akathisia:  NA  Handed:  Right  AIMS (if indicated):     Assets:  Communication Skills Desire for Improvement Intimacy Leisure Time Physical Health Resilience Social Support  Sleep:      Musculoskeletal: Strength & Muscle Tone: decreased Gait & Station: unable to stand Patient leans: N/A  Current Medications: Current Facility-Administered Medications  Medication Dose Route Frequency Provider Last Rate Last Dose  . acetaminophen (TYLENOL) tablet 650 mg  650 mg Oral Q6H PRN Annita Brod, MD       Or  . acetaminophen (TYLENOL) suppository 650 mg  650 mg Rectal Q6H PRN Annita Brod, MD      . amLODipine (NORVASC) tablet 5 mg  5 mg Oral Daily Annita Brod, MD   5 mg at 01/21/14 1100  . enoxaparin (LOVENOX) injection 40 mg  40 mg Subcutaneous Q24H Annita Brod, MD   40 mg at 01/20/14 1941  . folic acid (FOLVITE) tablet 1 mg  1 mg Oral Daily Annita Brod, MD   1 mg at 01/21/14 1059  . haloperidol (HALDOL) tablet 2 mg  2 mg Oral TID Levonne Spiller, MD   2 mg at 01/21/14 1059  . haloperidol lactate (HALDOL) injection 5 mg  5 mg Intravenous Q6H PRN Orson Eva, MD   5 mg at 01/21/14 0117  . LORazepam (ATIVAN) injection 0-4 mg  0-4 mg Intravenous Q12H Annita Brod, MD  2 mg at 01/21/14 0529  . LORazepam (ATIVAN) tablet 1 mg  1 mg Oral Q6H PRN Annita Brod, MD       Or  . LORazepam (ATIVAN) injection 1 mg  1 mg Intravenous Q6H PRN Annita Brod, MD   1 mg at 01/20/14 2207  . multivitamin with minerals tablet 1 tablet  1 tablet Oral Daily Annita Brod, MD   1 tablet at 01/21/14 1100  . ondansetron (ZOFRAN) tablet 4 mg  4 mg Oral Q6H PRN Annita Brod, MD       Or  . ondansetron [facility]) injection 4 mg  4 mg Intravenous Q6H PRN Annita Brod, MD      . oxyCODONE (Oxy IR/ROXICODONE) immediate  release tablet 5 mg  5 mg Oral Q4H PRN Annita Brod, MD      . piperacillin-tazobactam (ZOSYN) IVPB 3.375 g  3.375 g Intravenous 3 times per day Minda Ditto, RPH   3.375 g at 01/21/14 1358  . sodium chloride 0.9 % 1,000 mL with potassium chloride 20 mEq infusion   Intravenous Continuous Orson Eva, MD 75 mL/hr at 01/20/14 2207    . thiamine (VITAMIN B-1) tablet 100 mg  100 mg Oral Daily Annita Brod, MD   100 mg at 01/21/14 1059   Or  . thiamine (B-1) injection 100 mg  100 mg Intravenous Daily Annita Brod, MD      . vancomycin (VANCOCIN) 1,250 mg in sodium chloride 0.9 % 250 mL IVPB  1,250 mg Intravenous Q12H Julio Sicks, RPH   1,250 mg at 01/21/14 1358    Lab Results:  Results for orders placed during the hospital encounter of 01/18/14 (from the past 48 hour(s))  BASIC METABOLIC PANEL     Status: Abnormal   Collection Time    01/20/14  1:00 PM      Result Value Ref Range   Sodium 141  137 - 147 mEq/L   Potassium 3.5 (*) 3.7 - 5.3 mEq/L   Chloride 103  96 - 112 mEq/L   CO2 25  19 - 32 mEq/L   Glucose, Bld 103 (*) 70 - 99 mg/dL   BUN 7  6 - 23 mg/dL   Creatinine, Ser 1.09  0.50 - 1.35 mg/dL   Calcium 9.3  8.4 - 10.5 mg/dL   GFR calc non Af Amer 83 (*) >90 mL/min   GFR calc Af Amer >90  >90 mL/min   Comment: (NOTE)     The eGFR has been calculated using the CKD EPI equation.     This calculation has not been validated in all clinical situations.     eGFR's persistently <90 mL/min signify possible Chronic Kidney     Disease.   Anion gap 13  5 - 15  CBC     Status: Abnormal   Collection Time    01/20/14  1:00 PM      Result Value Ref Range   WBC 11.0 (*) 4.0 - 10.5 K/uL   RBC 3.74 (*) 4.22 - 5.81 MIL/uL   Hemoglobin 11.0 (*) 13.0 - 17.0 g/dL   HCT 33.0 (*) 39.0 - 52.0 %   MCV 88.2  78.0 - 100.0 fL   MCH 29.4  26.0 - 34.0 pg   MCHC 33.3  30.0 - 36.0 g/dL   RDW 14.8  11.5 - 15.5 %   Platelets 444 (*) 150 - 400 K/uL  MAGNESIUM     Status: None  Collection  Time    01/20/14  1:00 PM      Result Value Ref Range   Magnesium 2.0  1.5 - 2.5 mg/dL  HIV ANTIBODY (ROUTINE TESTING)     Status: None   Collection Time    01/20/14  1:00 PM      Result Value Ref Range   HIV 1&2 Ab, 4th Generation NONREACTIVE  NONREACTIVE   Comment: (NOTE)     A NONREACTIVE HIV Ag/Ab result does not exclude HIV infection since     the time frame for seroconversion is variable. If acute HIV infection     is suspected, a HIV-1 RNA Qualitative TMA test is recommended.     HIV-1/2 Antibody Diff         Not indicated.     HIV-1 RNA, Qual TMA           Not indicated.     PLEASE NOTE: This information has been disclosed to you from records     whose confidentiality may be protected by state law. If your state     requires such protection, then the state law prohibits you from making     any further disclosure of the information without the specific written     consent of the person to whom it pertains, or as otherwise permitted     by law. A general authorization for the release of medical or other     information is NOT sufficient for this purpose.     The performance of this assay has not been clinically validated in     patients less than 50 years old.     Performed at [facility], [place]     Status: Abnormal   Collection Time    01/20/14  1:15 PM      Result Value Ref Range   Vancomycin Tr 22.1 (*) 10.0 - 20.0 ug/mL  BASIC METABOLIC PANEL     Status: Abnormal   Collection Time    01/21/14  8:30 AM      Result Value Ref Range   Sodium 142  137 - 147 mEq/L   Potassium 3.5 (*) 3.7 - 5.3 mEq/L   Chloride 105  96 - 112 mEq/L   CO2 25  19 - 32 mEq/L   Glucose, Bld 99  70 - 99 mg/dL   BUN 5 (*) 6 - 23 mg/dL   Creatinine, Ser 1.06  0.50 - 1.35 mg/dL   Calcium 9.3  8.4 - 10.5 mg/dL   GFR calc non Af Amer 86 (*) >90 mL/min   GFR calc Af Amer >90  >90 mL/min   Comment: (NOTE)     The eGFR has been calculated using the CKD EPI equation.     This  calculation has not been validated in all clinical situations.     eGFR's persistently <90 mL/min signify possible Chronic Kidney     Disease.   Anion gap 12  5 - 15  CBC     Status: Abnormal   Collection Time    01/21/14  8:30 AM      Result Value Ref Range   WBC 10.7 (*) 4.0 - 10.5 K/uL   RBC 3.67 (*) 4.22 - 5.81 MIL/uL   Hemoglobin 10.7 (*) 13.0 - 17.0 g/dL   HCT 32.5 (*) 39.0 - 52.0 %   MCV 88.6  78.0 - 100.0 fL   MCH 29.2  26.0 - 34.0 pg   MCHC 32.9  30.0 - 36.0 g/dL   RDW 14.8  11.5 - 15.5 %   Platelets 367  150 - 400 K/uL    Physical Findings: AIMS:  , ,  ,  ,    CIWA:  CIWA-Ar Total: 7 COWS:     Treatment Plan Summary: Daily contact with patient to assess and evaluate symptoms and progress in treatment Medication management  Plan:  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 Monitor for adverse effects like EPS and withdrawal seizures Patient may benefit from substance rehabilitation treatment upon medically discharged.  Medical Decision Making Problem Points:  Established problem, worsening (2), Review of last therapy session (1) and Review of psycho-social stressors (1) Data Points:  Review or order clinical lab tests (1) Review of medication regiment & side effects (2) Review of new medications or change in dosage (2)  I certify that inpatient services furnished can reasonably be expected to improve the patient's condition.   Hayden Kihara,JANARDHAHA R. 01/21/2014, 2:29 PM

## 2014-01-21 NOTE — Progress Notes (Signed)
Informed by Joellen Jersey, RN that pt pulled out IV. IV team tried to place IV x 2, unsucessful Pt refusing any further sticks.  Now more aggitated  Ativan changed to po. Haldol changed to 5mg  IM prn aggitation Will change zosyn to po levoflox  DTat

## 2014-01-21 NOTE — Progress Notes (Signed)
Patient has no IV access and IV team during day unable to obtain access. Night IV RN and shift RN asked patient if it was ok to try to start another IV. Patient replied, "Only if you get it right the first time. People today tried too many times and still couldn't get it." Explained to patient that he is a hard stick and that sometimes it takes more than one try to get an IV. Patient stated, "You better be able to get a vein. Everyone today was sticking me for no reason." Told patient that it was possible that we may not be able to obtain access the first try and patient refused IV. Will continue to monitor.

## 2014-01-21 NOTE — Progress Notes (Signed)
Pt insisting on getting into the shower, even though instructed that it would be unsafe to do so. Pt was verbally aggressive and abusive, using profanity at both NTs and RNs present. Pt's IV had come disconnected  from running IVF and IV abx. Pt refused multiple times to let this RN replace IV connector piece. Pt shouting at RN from shower that he "don't care what you're asking me to do!" IV left clamped, so pt would not bleed out. Pt adamently refusing to let RNs and NTs help him. Pt instructed bathroom door must be left open, so safety sitter can ensure pt does not harm himself. Will continue to monitor pt, and perform safety checks.

## 2014-01-21 NOTE — Progress Notes (Signed)
Clinical Social Work  CSW met with patient and psych MD at bedside. Patient laying in bed with his eyes closed and disengaged. Patient refusing assessment and becomes irritable easily. CSW to follow up at later time.   , LCSW [phone] 

## 2014-01-21 NOTE — Progress Notes (Signed)
PROGRESS NOTE  Ralph Chavez GHW:[id number] DOB: January 10, 1971 DOA: 01/18/2014 PCP: No PCP Per Patient  Interim summary  43 year old male with a history of hypertension, pneumonia, tobacco abuse, alcohol abuse and polysubstance abuse who had 2 previous admissions to the hospital in August 2015. On his admission from 12/20/2013 through 12/25/2013 the patient had an elevated troponin and underwent cardiac catheterization on 12/24/2013 which showed clean coronaries. During that hospitalization, the patient also had aspiration pneumonitis and pulmonary edema. He was discharged with 5 days of Augmentin. The patient was re-admitted on 12/31/2013 until he left [place] on 01/11/2014. During that admission, the patient had acute respiratory failure secondary to pneumonia with loculated pleural effusion, and he required placement of a right-sided chest tube on 01/01/2014 he had his pigtail catheter removed on August 23. He also had a thoracocentesis 01/04/2014 due to residual loculated collection in the right lung that was not drained by the initial chest tube.. Because of persistent pleural fluid, another R-chest tube was placed by IR on 01/08/2014. He was treated with initially with vancomycin and cefepime. After initially 4 days of vancomycin, cefepime was continued through the hospitalization. All his cultures including blood culture and pleural fluid cultures were negative, and cytology on 01/04/2014 was negative for malignancy. His second chest tube was ultimately discontinued on 01/11/2014 and he was transitioned to oral levofloxacin. Pulmonary followup was set up with Dr. Halford Chessman for 02/01/14@noon . The patient represented to the emergency department on 01/18/2014 because of confusion. His urine drug screen showed only benzodiazepines, and his alcohol level was negative. The patient was brought to the emergency department by the [facility] and paramedics where he was quite confused and  found to have a questionable right lower lobe infiltrate with WBC 20.7 and tachycardia. At the beginning of the hospital admission, the patient was very paranoid and belligerent.  The patient continues to be easily irritable, but has improved with Haldol. He has revealed that he has been using crystal meth and cocaine prior to the admission. Assessment/Plan:  Sepsis  -Present at the time of admission  -Secondary to HCAP/empyema  -Continue vancomycin and Zosyn for now which were started at the time of this admission  -continue IVF  -adjust abx pending culture data  HCAP/Empyema  -The patient left AGAINST MEDICAL ADVICE on 01/11/2014  -It is unclear whether the patient took any additional antibiotics after he left the hospital  -He had received cefepime from 12/31/2013 to 01/11/2014  -Continue IV antibiotics  -Flutter valve  -Bronchial hygiene  -Bronchodilators  -01/21/2014--discontinue vancomycin, continue Zosyn  Bacteremia  -CNS in one of two sets--likely contaminant  -Discontinue vancomycin  Acute encephalopathy  -Secondary to sepsis and drug induced psychosis -unclear what the patient's baseline is--patient less Belligerent but remains intermittently aggressive and verbally abusive -alcohol withdrawal protocol  -I consulted psychiatry for substance abuse induced psychosis and capacity evaluation   - appreciate psychiatry followup  -01/20/2014 Haldol dose decreased  AKI  -improved  -Secondary to sepsis and hypovolemia  -Continue IV fluids  -Baseline creatinine 0.6-0.8  impaired glucose tolerance  -Hemoglobin A1c 5.8  -patient will not be cooperative with CBGs and insulin at this time--defer checks  Hypertension  -Continue amlodipine  Polysubstance abuse  -Tobacco cessation discussed  -Check HIV--neg  -Hep B and C neg  Hypokalemia  -Replete  -Check magnesium  Family Communication: Updated patient's mother on telephone  Disposition Plan: will be difficult given mental  status Antibiotics:  Vancomycin 01/18/14>>> 01/21/2014 Zosyn 01/18/14>>>      Procedures/Studies: Dg Chest 2 View  12/31/2013   CLINICAL DATA:  Pneumonia.  Recent heart attack.  Smoker  EXAM: CHEST  2 VIEW  COMPARISON:  12/24/2013  FINDINGS: The heart size appears normal. There is been significant interval decrease in aeration to right lung which is felt a likely represent a combination of large pleural effusion and associated atelectasis. Pulmonary vascular congestion is noted.  IMPRESSION: 1. Interval development of large right pleural effusion with associated compressive type atelectasis of the right lung.   Electronically Signed   By: Kerby Moors M.D.   On: 12/31/2013 19:17   Dg Chest 2 View  12/24/2013   CLINICAL DATA:  Evaluate for a rib fracture. Right chest pain and shortness of breath.  EXAM: CHEST  2 VIEW  COMPARISON:  12/21/2013  FINDINGS: Two views of the chest were obtained. Increased densities at the right lung base are compatible with airspace disease and pleural fluid. No evidence for a pneumothorax. Few densities at the left lung base but no significant airspace disease on the left side. There is fullness in the right hilar region which could be related to atelectasis or pleural fluid. Heart size is stable. Bony thorax is intact. Please note that dedicated rib images were not obtained.  IMPRESSION: Increased densities at the right lung base are related to airspace disease and pleural fluid.   Electronically Signed   By: Markus Daft M.D.   On: 12/24/2013 08:39   Ct Chest W Contrast  01/04/2014   CLINICAL DATA:  Pleural effusion  EXAM: CT CHEST WITH CONTRAST  TECHNIQUE: Multidetector CT imaging of the chest was performed during intravenous contrast administration.  CONTRAST:  102mL OMNIPAQUE IOHEXOL 300 MG/ML  SOLN  COMPARISON:  Chest CT December 31, 2013 and chest radiograph January 04, 2014  FINDINGS: Overall, the right-sided effusion is significantly smaller than on recent chest CT,  status post chest tube placement. Note that the tube tip is in the right upper hemi thorax or fluid is drain. There is pleural effusion, moderate, in the right base which is not connected to the chest tube and remains. Two loculated fluid collections are seen adjacent to the right heart border. There is moderate consolidation in the right lower lobe.  There is mild left base atelectatic change. Left lung is otherwise clear.  There is no appreciable thoracic adenopathy. Pericardium is not thickened.  In the visualized upper abdomen, there is fatty liver. Visualized upper abdominal structures otherwise appear normal. There are no blastic or lytic bone lesions. Visualized thyroid appears unremarkable.  IMPRESSION: There has been drainage of most of the loculated effusion from the upper right hemithorax due to the chest tube which is present in this area. Pleural fluid in the right base which appears at least partially free in partially loculated remains. There are two loculated fluid collections which abuts the right heart border which also remain and are separate from the chest tube. There is patchy consolidation throughout much of the right lower lobe. Left lung is clear except for rather minimal left base atelectatic change.  There is fatty liver.   Electronically Signed   By: Lowella Grip M.D.   On: 01/04/2014 19:08   Ct Angio Chest Pe W/cm &/or Wo Cm  12/31/2013   CLINICAL DATA:  Shortness of breath.  Large right pleural effusion.  EXAM: CT ANGIOGRAPHY CHEST WITH CONTRAST  TECHNIQUE: Multidetector CT imaging of the chest was performed using  the standard protocol during bolus administration of intravenous contrast. Multiplanar CT image reconstructions and MIPs were obtained to evaluate the vascular anatomy.  CONTRAST:  170mL OMNIPAQUE IOHEXOL 350 MG/ML SOLN  COMPARISON:  Chest x-ray, same date.  FINDINGS: Chest wall:  No chest wall mass, supraclavicular or axillary lymphadenopathy. The thyroid gland is  normal. The bony thorax is intact. No destructive bone lesions or spinal canal compromise. The sternum is intact.  Mediastinum:  The heart is normal in size. No mediastinal or hilar mass or adenopathy. Borderline scattered lymph nodes. The esophagus is grossly normal. The aorta is normal in caliber. No dissection.  Lungs:  There is a large loculated appearing right pleural effusion occupying a good portion of the right hemi thorax. There is severe compressive atelectasis of the right long which is near complete except for the right upper lobe. No endobronchial lesion or obvious bronchial obstruction. I do not see any obvious enhancement of the pleural to suggest empyema. No pleural nodularity. The left lung is clear except because of the large pleural effusion there is mild mass effect on the heart and mediastinum to the left.  Upper abdomen:  Unremarkable.  Review of the MIP images confirms the above findings.  IMPRESSION: 1. Large loculated appearing right pleural effusion with near complete compressive atelectasis of the right lung. There is also mass effect on the heart and mediastinal structures which are shifted to the left. I do not see any obvious pleural enhancement but empyema is a possibility. 2. No mediastinal or hilar mass or adenopathy.   Electronically Signed   By: Kalman Jewels M.D.   On: 12/31/2013 21:30   Mr Lumbar Spine Wo Contrast  01/07/2014   CLINICAL DATA:  LEFT leg burning and weakness. Evaluate for possible abscess.  EXAM: MRI LUMBAR SPINE WITHOUT CONTRAST  TECHNIQUE: Multiplanar, multisequence MR imaging of the lumbar spine was performed. No intravenous contrast was administered.  COMPARISON:  CT lumbar spine performed 12/20/2013.  FINDINGS: Anatomic alignment. Normal conus. Hypercellular marrow could indicate chronic disease, smoking, or be related to increased body habitus. No focal areas of marrow replacement.  No appreciable asymmetry of size or signal of the psoas muscles.  Normal  conus.  No intraspinal mass lesion.  At L1-2, there are Schmorl's nodes with mild annular bulging but no impingement.  At L2-3, there is mild annular bulging and mild facet arthropathy without impingement.  The L3-4, and L4-5 disc spaces appear unremarkable.  At L5-S1, there is a tiny annular fissure in the LEFT foramen without frank disc protrusion. Transitional anatomy with partially fused facet joint on the RIGHT at this level.  IMPRESSION: Tiny annular fissure at L5-S1 on the LEFT. It is unclear if this could be symptomatic.  No appreciable asymmetry in size or signal of the psoas muscles.   Electronically Signed   By: Rolla Flatten M.D.   On: 01/07/2014 20:49   Dg Chest Port 1 View  01/18/2014   CLINICAL DATA:  Tachycardia, chest pain, shortness of breath, unspecified drug use  EXAM: PORTABLE CHEST - 1 VIEW  COMPARISON:  Portable exam 1348 hr compared to 01/12/2014  FINDINGS: Enlargement of cardiac silhouette with slight pulmonary vascular congestion.  Mediastinal contours stable.  Peribronchial thickening with minimal RIGHT basilar atelectasis versus infiltrate little changed.  Remaining lungs clear.  No pleural effusion or pneumothorax.  IMPRESSION: Bronchitic changes with persistent atelectasis versus infiltrate at RIGHT base.   Electronically Signed   By: Lavonia Dana M.D.   On:  01/18/2014 13:59   Dg Chest Port 1 View  01/11/2014   CLINICAL DATA:  Pneumonia.  Right-sided chest tube.  EXAM: PORTABLE CHEST - 1 VIEW  COMPARISON:  01/10/2014.  FINDINGS: Pigtail catheter in the inferior right hemi thorax is stable. There is surrounding right lung base consolidation. The lungs show diffuse irregular interstitial densities, also unchanged. No pneumothorax.  Cardiac silhouette is mildly enlarged.  IMPRESSION: No change from the previous day's study.  No pneumothorax.   Electronically Signed   By: Lajean Manes M.D.   On: 01/11/2014 07:14   Dg Chest Port 1 View  01/10/2014   CLINICAL DATA:  Pneumonia. Right  pleural effusion. Right chest tube.  EXAM: PORTABLE CHEST - 1 VIEW  COMPARISON:  01/09/2014.  FINDINGS: Right chest tube in stable position. Mediastinum and hilar structures are stable. Stable cardiomegaly with pulmonary venous congestion and bilateral interstitial prominence consistent with congestive heart failure. Stable small right pleural effusion. No pneumothorax. No acute osseus abnormality.  IMPRESSION: 1. Right chest tube in stable position. Stable small right pleural effusion. 2. Stable changes of congestive heart failure and pulmonary interstitial edema.   Electronically Signed   By: Marcello Moores  [place]   On: 01/10/2014 07:46   Dg Chest Port 1 View  01/09/2014   CLINICAL DATA:  Pleural effusion  EXAM: PORTABLE CHEST - 1 VIEW  COMPARISON:  January 06, 2014  FINDINGS: There is a chest tube on the right with significant interval resolution of pleural effusion. A small pleural effusion remains on the right. There is moderate generalized interstitial edema with cardiomegaly. No airspace consolidation. No bony lesions. No apparent pneumothorax.  IMPRESSION: Chest tube at right base. There is evidence of congestive heart failure with edema and cardiomegaly. There is a small residual effusion on the right. No pneumothorax apparent   Electronically Signed   By: Lowella Grip M.D.   On: 01/09/2014 07:00   Dg Chest Port 1 View  01/06/2014   CLINICAL DATA:  Status post right chest tube removal  EXAM: PORTABLE CHEST - 1 VIEW  COMPARISON:  Portable chest x-ray of January 04, 2014  FINDINGS: There has been interval removal of the small caliber chest tube on the right. There remains pleural thickening or fluid along the right lateral thoracic wall and at the right lung base. There is no pneumothorax. The left lung is adequately inflated and grossly clear. The cardiac silhouette is top-normal in size but stable. The central pulmonary vascularity is prominent but also stable.  IMPRESSION: There is no evidence of  significant additional pleural fluid reaccumulation nor development of a pneumothorax since removal of the right-sided chest tube.   Electronically Signed   By: Omie Ferger  [place]   On: 01/06/2014 12:22   Dg Chest Port 1 View  01/04/2014   CLINICAL DATA:  Shortness of breath, cough, large pleural effusion  EXAM: PORTABLE CHEST - 1 VIEW  COMPARISON:  Portable exam 0948 hr compared to 0515 hr  FINDINGS: Pigtail RIGHT thoracostomy tube unchanged.  Enlargement of cardiac silhouette.  Mediastinal contours and pulmonary vascularity normal.  Persistent RIGHT pleural effusion and basilar atelectasis.  Lungs otherwise clear.  No pneumothorax or LEFT pleural effusion identified.  IMPRESSION: Persistent RIGHT pleural effusion post thoracostomy tube placement.  Persistent atelectasis RIGHT base, little changed from earlier study.   Electronically Signed   By: Lavonia Dana M.D.   On: 01/04/2014 09:55   Dg Chest Port 1 View  01/04/2014   CLINICAL DATA:  Followup pleural effusion. Status  post pigtail drainage.  EXAM: PORTABLE CHEST - 1 VIEW  COMPARISON:  12/2013.  FINDINGS: Cardiac enlargement persists. Pigtail catheter missing good position. Considerable effusion remains inferior to the location of the drainage catheter. There is consolidation and atelectasis at the RIGHT mid and lower lung zones. The bones are unremarkable.  IMPRESSION: Stable chest. Considerable right-sided pleural effusion remains, unchanged from 01/03/2014.   Electronically Signed   By: Rolla Flatten M.D.   On: 01/04/2014 07:18   Dg Chest Port 1 View  01/03/2014   CLINICAL DATA:  Right pleural effusion  EXAM: PORTABLE CHEST - 1 VIEW  COMPARISON:  Chest x-ray from yesterday  FINDINGS: Significant decrease in a right pleural effusion. The pleural fluid volume is now moderate, located along the lower and lateral right chest. Some fissural fluid is present. There is no overt re-expansion pulmonary edema. There has been relatively good re-expansion of the right  lung. Streaky retrocardiac opacity, similar to previous.  Unchanged cardiomegaly. Stable upper mediastinal contours, likely with loculated pleural fluid along the peritracheal stripe.  IMPRESSION: Significant decrease in right pleural effusion with good re-expansion of the right lung.   Electronically Signed   By: Jorje Guild M.D.   On: 01/03/2014 05:34   Dg Chest Port 1 View  01/02/2014   CLINICAL DATA:  Right chest tube placement  EXAM: PORTABLE CHEST - 1 VIEW  COMPARISON:  01/02/2014  FINDINGS: Large right pleural effusion is unchanged. Right lower lobe is collapsed. Small amount of aerated right upper lobe unchanged. Pigtail catheter overlying the right chest is unchanged. No pneumothorax.  Left lung is clear.  Cardiac enlargement.  IMPRESSION: Large right effusion and collapse of the right lower lobe unchanged. Right pigtail catheter unchanged in position.   Electronically Signed   By: Franchot Gallo M.D.   On: 01/02/2014 14:24   Dg Chest Port 1 View  01/02/2014   CLINICAL DATA:  Right pleural drainage catheter  EXAM: PORTABLE CHEST - 1 VIEW  COMPARISON:  Chest x-ray from yesterday  FINDINGS: A large right pleural effusion, which is loculated by CT, shows slight interval decrease in volume. Leftward mediastinal shift persists. Congested appearance of the left lung vessels likely related to extensive lung compression on the right. A right-sided pleural drainage catheter is in unchanged position, located at the mid to upper chest level. Stable heart size and mediastinal contours.  IMPRESSION: Mild decrease in a still large right pleural effusion.   Electronically Signed   By: Jorje Guild M.D.   On: 01/02/2014 05:44   Dg Chest Port 1 View  01/01/2014   CLINICAL DATA:  Chest tube placement.  EXAM: PORTABLE CHEST - 1 VIEW  COMPARISON:  CT 12/31/2013  FINDINGS: Right chest tube noted over the right upper chest. Prominent right pleural effusion remains. No pneumothorax. Left lung is clear. Cardiac  structures are unremarkable. No acute bony abnormality.  IMPRESSION: Interim placement of right chest tube, its tip is projected over the right upper chest. Persistent large right pleural effusion.   Electronically Signed   By: [place]   On: 01/01/2014 10:18   Dg Knee Left Port  01/02/2014   CLINICAL DATA:  Left knee pain after fall.  EXAM: PORTABLE LEFT KNEE - 1-2 VIEW  COMPARISON:  December 20, 2013.  FINDINGS: There is no evidence of fracture, dislocation, or joint effusion. There is no evidence of arthropathy or other focal bone abnormality. Soft tissues are unremarkable.  IMPRESSION: Normal left knee.   Electronically Signed  By: Sabino Dick M.D.   On: 01/02/2014 11:42   Ct Image Guided Fluid Drain By Catheter  01/08/2014   CLINICAL DATA:  Loculated right pleural effusion  EXAM: CT-GUIDED RIGHT POSTERIOR 10 FRENCH CHEST TUBE INSERTION  Date:  8/25/20158/25/2015 2:05 pm  Radiologist:  M. Daryll Brod, MD  Guidance:  CT  FLUOROSCOPY TIME:  None.  MEDICATIONS AND MEDICAL HISTORY: 2 mg Versed, 50 mcg fentanyl  ANESTHESIA/SEDATION: 15 min  CONTRAST:  None.  COMPLICATIONS: No immediate  PROCEDURE: Informed consent was obtained from the patient following explanation of the procedure, risks, benefits and alternatives. The patient understands, agrees and consents for the procedure. All questions were addressed. A time out was performed.  Maximal barrier sterile technique utilized including caps, mask, sterile gowns, sterile gloves, large sterile drape, hand hygiene, and Betadine.  Previous imaging reviewed. Patient positioned right anterior oblique. Noncontrast localization CT performed. The posterior loculated right pleural effusion was localized. Under sterile conditions and local anesthesia, an 18 gauge introducer needle was advanced from a lateral intercostal approach into the fluid collection. Needle position confirmed with CT. Syringe aspiration yielded pleural fluid. Guidewire advanced followed by  dilatation to insert a 10 [place] drain. Catheter position confirmed with CT. No immediate complication. Patient tolerated the procedure well. Catheter secured with a Prolene suture and connected to external pleura vac.  IMPRESSION: Successful CT-guided 10 French right chest drain insertion for a loculated pleural effusion.   Electronically Signed   By: Daryll Brod M.D.   On: 01/08/2014 15:27         Subjective: Patient denies fevers, chills, headache, chest pain, dyspnea, nausea, vomiting, diarrhea, abdominal pain, dysuria, hematuria   Objective: Filed Vitals:   01/20/14 2053 01/21/14 0533 01/21/14 1058 01/21/14 1408  BP: 155/95 141/84 142/71 149/89  Pulse: 99 92  97  Temp: 98 F (36.7 C) 99.6 F (37.6 C)  98.7 F (37.1 C)  TempSrc: Oral Oral  Oral  Resp: 20 20  20   Height:      Weight:      SpO2: 98% 96%  97%    Intake/Output Summary (Last 24 hours) at 01/21/14 1745 Last data filed at 01/21/14 0730  Gross per 24 hour  Intake   2300 ml  Output   2095 ml  Net    205 ml   Weight change:  Exam:   General:  Pt is alert, follows commands appropriately, not in acute distress  HEENT: No icterus, No thrush, [place]/AT  Cardiovascular: RRR, S1/S2, no rubs, no gallops  Respiratory: Bibasilar crackles. Left clear to auscultation. No wheezing. Good air movement.   Abdomen: Soft/+BS, non tender, non distended, no guarding  Extremities: No edema, No lymphangitis, No petechiae, No rashes, no synovitis  Data Reviewed: Basic Metabolic Panel:  Recent Labs Lab 01/18/14 1306 01/19/14 0418 01/20/14 1300 01/21/14 0830  NA 139 140 141 142  K 3.4* 3.3* 3.5* 3.5*  CL 97 104 103 105  CO2 21 22 25 25   GLUCOSE 185* 99 103* 99  BUN 15 13 7  5*  CREATININE 1.48* 1.21 1.09 1.06  CALCIUM 10.6* 9.2 9.3 9.3  MG  --   --  2.0  --    Liver Function Tests:  Recent Labs Lab 01/18/14 1306  AST 25  ALT 23  ALKPHOS 100  BILITOT 0.7  PROT 8.8*  ALBUMIN 3.6   No results found for  this basename: LIPASE, AMYLASE,  in the last 168 hours No results found for this basename:  AMMONIA,  in the last 168 hours CBC:  Recent Labs Lab 01/18/14 1306 01/19/14 0418 01/20/14 1300 01/21/14 0830  WBC 20.7* 14.6* 11.0* 10.7*  NEUTROABS 18.3*  --   --   --   HGB 13.4 10.9* 11.0* 10.7*  HCT 39.3 33.1* 33.0* 32.5*  MCV 88.3 88.3 88.2 88.6  PLT 610* 417* 444* 367   Cardiac Enzymes: No results found for this basename: CKTOTAL, CKMB, CKMBINDEX, TROPONINI,  in the last 168 hours BNP: No components found with this basename: POCBNP,  CBG:  Recent Labs Lab 01/18/14 1243  GLUCAP 192*    Recent Results (from the past 240 hour(s))  CULTURE, BLOOD (ROUTINE X 2)     Status: None   Collection Time    01/18/14  1:04 PM      Result Value Ref Range Status   Specimen Description BLOOD RIGHT ANTECUBITAL   Final   Special Requests BOTTLES DRAWN AEROBIC AND ANAEROBIC 5ML   Final   Culture  Setup Time     Final   Value: 01/18/2014 20:45     Performed at [facility]   Culture     Final   Value:        BLOOD CULTURE RECEIVED NO GROWTH TO DATE CULTURE WILL BE HELD FOR 5 DAYS BEFORE ISSUING A FINAL NEGATIVE REPORT     Performed at [facility]   Report Status PENDING   Incomplete  CULTURE, BLOOD (ROUTINE X 2)     Status: None   Collection Time    01/18/14  1:07 PM      Result Value Ref Range Status   Specimen Description BLOOD RIGHT ARM   Final   Special Requests BOTTLES DRAWN AEROBIC ONLY 1ML   Final   Culture  Setup Time     Final   Value: 01/18/2014 20:45     Performed at [facility]   Culture     Final   Value: STAPHYLOCOCCUS SPECIES (COAGULASE NEGATIVE)     Note: CRITICAL RESULT CALLED TO, READ BACK BY AND VERIFIED WITH: Nelia Shi RN @1109PM  Thelma Comp [id number] THE SIGNIFICANCE OF ISOLATING THIS ORGANISM FROM A SINGLE SET OF BLOOD CULTURES WHEN MULTIPLE SETS ARE DRAWN IS UNCERTAIN. PLEASE NOTIFY THE MICROBIOLOGY      DEPARTMENT WITHIN ONE WEEK IF SPECIATION AND  SENSITIVITIES ARE REQUIRED.     Performed at [facility]   Report Status 01/21/2014 FINAL   Final  URINE CULTURE     Status: None   Collection Time    01/18/14  3:43 PM      Result Value Ref Range Status   Specimen Description URINE, RANDOM   Final   Special Requests NONE   Final   Culture  Setup Time     Final   Value: 01/18/2014 20:05     Performed at [place]     Final   Value: NO GROWTH     Performed at [facility]   Culture     Final   Value: NO GROWTH     Performed at [facility]   Report Status 01/19/2014 FINAL   Final     Scheduled Meds: . amLODipine  5 mg Oral Daily  . enoxaparin (LOVENOX) injection  40 mg Subcutaneous Q24H  . folic acid  1 mg Oral Daily  . haloperidol  2 mg Oral TID  . LORazepam  0-4 mg Intravenous Q12H  . multivitamin with minerals  1 tablet Oral Daily  . piperacillin-tazobactam (ZOSYN)  IV  3.375 g Intravenous 3 times per day  . thiamine  100 mg Oral Daily   Or  . thiamine  100 mg Intravenous Daily  . vancomycin  1,250 mg Intravenous Q12H   Continuous Infusions: . sodium chloride 0.9 % 1,000 mL with potassium chloride 20 mEq infusion 75 mL/hr at 01/20/14 2207     Ebubechukwu Jedlicka, DO  [facility] Pager [phone]  If 7PM-7AM, please contact night-coverage [email or web address] Password [email or web address] 01/21/2014, 5:45 PM   LOS: 3 days

## 2014-01-22 LAB — BASIC METABOLIC PANEL
Anion gap: 14 (ref 5–15)
BUN: 7 mg/dL (ref 6–23)
CO2: 24 mEq/L (ref 19–32)
Calcium: 9.8 mg/dL (ref 8.4–10.5)
Chloride: 100 mEq/L (ref 96–112)
Creatinine, Ser: 1.52 mg/dL — ABNORMAL HIGH (ref 0.50–1.35)
GFR calc Af Amer: 64 mL/min — ABNORMAL LOW (ref >90)
GFR, EST NON AFRICAN AMERICAN: 55 mL/min — AB (ref >90)
Glucose, Bld: 93 mg/dL (ref 70–99)
POTASSIUM: 3.4 meq/L — AB (ref 3.7–5.3)
SODIUM: 138 meq/L (ref 137–147)

## 2014-01-22 LAB — CBC
HCT: 32.9 % — ABNORMAL LOW (ref 39.0–52.0)
Hemoglobin: 11 g/dL — ABNORMAL LOW (ref 13.0–17.0)
MCH: 29.7 pg (ref 26.0–34.0)
MCHC: 33.4 g/dL (ref 30.0–36.0)
MCV: 88.9 fL (ref 78.0–100.0)
PLATELETS: 350 10*3/uL (ref 150–400)
RBC: 3.7 MIL/uL — ABNORMAL LOW (ref 4.22–5.81)
RDW: 14.9 % (ref 11.5–15.5)
WBC: 14.3 10*3/uL — AB (ref 4.0–10.5)

## 2014-01-22 NOTE — Progress Notes (Signed)
PROGRESS NOTE  Ralph Chavez QPY:[id number] DOB: 1970-10-05 DOA: 01/18/2014 PCP: No PCP Per Patient  Interim summary  43 year old male with a history of hypertension, pneumonia, tobacco abuse, alcohol abuse and polysubstance abuse who had 2 previous admissions to the hospital in August 2015. On his admission from 12/20/2013 through 12/25/2013 the patient had an elevated troponin and underwent cardiac catheterization on 12/24/2013 which showed clean coronaries. During that hospitalization, the patient also had aspiration pneumonitis and pulmonary edema. He was discharged with 5 days of Augmentin. The patient was re-admitted on 12/31/2013 until he left [place] on 01/11/2014. During that admission, the patient had acute respiratory failure secondary to pneumonia with loculated pleural effusion, and he required placement of a right-sided chest tube on 01/01/2014 he had his pigtail catheter removed on August 23. He also had a thoracocentesis 01/04/2014 due to residual loculated collection in the right lung that was not drained by the initial chest tube.. Because of persistent pleural fluid, another R-chest tube was placed by IR on 01/08/2014. He was treated with initially with vancomycin and cefepime. After initially 4 days of vancomycin, cefepime was continued through the hospitalization. All his cultures including blood culture and pleural fluid cultures were negative, and cytology on 01/04/2014 was negative for malignancy. His second chest tube was ultimately discontinued on 01/11/2014 and he was transitioned to oral levofloxacin. Pulmonary followup was set up with Dr. Halford Chessman for 02/01/14@noon . The patient represented to the emergency department on 01/18/2014 because of confusion. His urine drug screen showed only benzodiazepines, and his alcohol level was negative. The patient was brought to the emergency department by the [facility] and paramedics where he was quite confused and  found to have a questionable right lower lobe infiltrate with WBC 20.7 and tachycardia. At the beginning of the hospital admission, the patient was very paranoid and belligerent. The patient continues to be easily irritable, but has improved with Haldol. He has revealed that he has been using crystal meth and cocaine prior to the admission.  On 01/22/2014, the patient's WBC and serum creatinine increased, but the patient did not want IV access to be placed. He stated he would be more agreeable to having IV access on 01/23/2014 if his condition worsened. Assessment/Plan:  Sepsis  -Present at the time of admission  -Secondary to HCAP/empyema  -Evening of 01/21/2014--patient forgot his IV and refused placement of a new IV after IV team attempted x2 -Evening of 01/21/2014--patient was started on oral levofloxacin -continue IVF--discontinued after the patient was IV   HCAP/Empyema  -The patient left AGAINST MEDICAL ADVICE on 01/11/2014  -It is unclear whether the patient took any additional antibiotics after he left the hospital  -He had received cefepime from 12/31/2013 to 01/11/2014 on previous admit -01/22/14--WBC increased--pt still did not want IV to be placed, but stated he wound reconsider 9/9/5-->continue levofloxacin for now -Flutter valve  -Bronchial hygiene  -Bronchodilators  -restart IV abx if WBC continues to increase and if pt allows IV access to be placed Bacteremia  -CNS in one of two sets--likely contaminant  -Discontinue vancomycin  Acute encephalopathy  -Secondary to sepsis and drug induced psychosis  -unclear what the patient's baseline is--patient less Belligerent but remains intermittently aggressive and verbally abusive  -alcohol withdrawal protocol  -I consulted psychiatry for substance abuse induced psychosis and capacity evaluation  -01/22/14-- reconsulted psychiatry today for capacity evaluation -continue scheduled haldol 2 mg q 8 hrs with prn IM dosing for  aggitation AKI  -01/23/2014--serum creatinine increased--patient still did not want IV access -Spoke with patient regarding risks and benefits and alternatives. He stated that he would be agreeable for IV access on 01/23/2014 if his condition worsened -Secondary to sepsis and hypovolemia  -BMP in the morning--restart IV fluids if renal function worsens and pt agrees to IV access -Baseline creatinine 0.6-0.8  impaired glucose tolerance  -Hemoglobin A1c 5.8  -patient will not be cooperative with CBGs and insulin at this time--defer checks  Hypertension  -Continue amlodipine  Polysubstance abuse  -Tobacco cessation discussed  -Check HIV--neg  -Hep B and C neg  Hypokalemia  -Replete  -Check magnesium  Family Communication: Updated patient's mother on telephone  Disposition Plan: will be difficult given mental status  Antibiotics:  Vancomycin 01/18/14>>> 01/21/2014  Zosyn 01/18/14>>>01/21/14 Levofloxacin 01/21/14>>>      Procedures/Studies: Dg Chest 2 View  12/31/2013   CLINICAL DATA:  Pneumonia.  Recent heart attack.  Smoker  EXAM: CHEST  2 VIEW  COMPARISON:  12/24/2013  FINDINGS: The heart size appears normal. There is been significant interval decrease in aeration to right lung which is felt a likely represent a combination of large pleural effusion and associated atelectasis. Pulmonary vascular congestion is noted.  IMPRESSION: 1. Interval development of large right pleural effusion with associated compressive type atelectasis of the right lung.   Electronically Signed   By: Kerby Moors M.D.   On: 12/31/2013 19:17   Dg Chest 2 View  12/24/2013   CLINICAL DATA:  Evaluate for a rib fracture. Right chest pain and shortness of breath.  EXAM: CHEST  2 VIEW  COMPARISON:  12/21/2013  FINDINGS: Two views of the chest were obtained. Increased densities at the right lung base are compatible with airspace disease and pleural fluid. No evidence for a pneumothorax. Few densities at the left lung  base but no significant airspace disease on the left side. There is fullness in the right hilar region which could be related to atelectasis or pleural fluid. Heart size is stable. Bony thorax is intact. Please note that dedicated rib images were not obtained.  IMPRESSION: Increased densities at the right lung base are related to airspace disease and pleural fluid.   Electronically Signed   By: Markus Daft M.D.   On: 12/24/2013 08:39   Ct Chest W Contrast  01/04/2014   CLINICAL DATA:  Pleural effusion  EXAM: CT CHEST WITH CONTRAST  TECHNIQUE: Multidetector CT imaging of the chest was performed during intravenous contrast administration.  CONTRAST:  60mL OMNIPAQUE IOHEXOL 300 MG/ML  SOLN  COMPARISON:  Chest CT December 31, 2013 and chest radiograph January 04, 2014  FINDINGS: Overall, the right-sided effusion is significantly smaller than on recent chest CT, status post chest tube placement. Note that the tube tip is in the right upper hemi thorax or fluid is drain. There is pleural effusion, moderate, in the right base which is not connected to the chest tube and remains. Two loculated fluid collections are seen adjacent to the right heart border. There is moderate consolidation in the right lower lobe.  There is mild left base atelectatic change. Left lung is otherwise clear.  There is no appreciable thoracic adenopathy. Pericardium is not thickened.  In the visualized upper abdomen, there is fatty liver. Visualized upper abdominal structures otherwise appear normal. There are no blastic or lytic bone lesions. Visualized thyroid appears unremarkable.  IMPRESSION: There has been drainage of most of the loculated effusion from the upper right hemithorax due  to the chest tube which is present in this area. Pleural fluid in the right base which appears at least partially free in partially loculated remains. There are two loculated fluid collections which abuts the right heart border which also remain and are separate  from the chest tube. There is patchy consolidation throughout much of the right lower lobe. Left lung is clear except for rather minimal left base atelectatic change.  There is fatty liver.   Electronically Signed   By: Lowella Grip M.D.   On: 01/04/2014 19:08   Ct Angio Chest Pe W/cm &/or Wo Cm  12/31/2013   CLINICAL DATA:  Shortness of breath.  Large right pleural effusion.  EXAM: CT ANGIOGRAPHY CHEST WITH CONTRAST  TECHNIQUE: Multidetector CT imaging of the chest was performed using the standard protocol during bolus administration of intravenous contrast. Multiplanar CT image reconstructions and MIPs were obtained to evaluate the vascular anatomy.  CONTRAST:  163mL OMNIPAQUE IOHEXOL 350 MG/ML SOLN  COMPARISON:  Chest x-ray, same date.  FINDINGS: Chest wall:  No chest wall mass, supraclavicular or axillary lymphadenopathy. The thyroid gland is normal. The bony thorax is intact. No destructive bone lesions or spinal canal compromise. The sternum is intact.  Mediastinum:  The heart is normal in size. No mediastinal or hilar mass or adenopathy. Borderline scattered lymph nodes. The esophagus is grossly normal. The aorta is normal in caliber. No dissection.  Lungs:  There is a large loculated appearing right pleural effusion occupying a good portion of the right hemi thorax. There is severe compressive atelectasis of the right long which is near complete except for the right upper lobe. No endobronchial lesion or obvious bronchial obstruction. I do not see any obvious enhancement of the pleural to suggest empyema. No pleural nodularity. The left lung is clear except because of the large pleural effusion there is mild mass effect on the heart and mediastinum to the left.  Upper abdomen:  Unremarkable.  Review of the MIP images confirms the above findings.  IMPRESSION: 1. Large loculated appearing right pleural effusion with near complete compressive atelectasis of the right lung. There is also mass effect on  the heart and mediastinal structures which are shifted to the left. I do not see any obvious pleural enhancement but empyema is a possibility. 2. No mediastinal or hilar mass or adenopathy.   Electronically Signed   By: Kalman Jewels M.D.   On: 12/31/2013 21:30   Mr Lumbar Spine Wo Contrast  01/07/2014   CLINICAL DATA:  LEFT leg burning and weakness. Evaluate for possible abscess.  EXAM: MRI LUMBAR SPINE WITHOUT CONTRAST  TECHNIQUE: Multiplanar, multisequence MR imaging of the lumbar spine was performed. No intravenous contrast was administered.  COMPARISON:  CT lumbar spine performed 12/20/2013.  FINDINGS: Anatomic alignment. Normal conus. Hypercellular marrow could indicate chronic disease, smoking, or be related to increased body habitus. No focal areas of marrow replacement.  No appreciable asymmetry of size or signal of the psoas muscles.  Normal conus.  No intraspinal mass lesion.  At L1-2, there are Schmorl's nodes with mild annular bulging but no impingement.  At L2-3, there is mild annular bulging and mild facet arthropathy without impingement.  The L3-4, and L4-5 disc spaces appear unremarkable.  At L5-S1, there is a tiny annular fissure in the LEFT foramen without frank disc protrusion. Transitional anatomy with partially fused facet joint on the RIGHT at this level.  IMPRESSION: Tiny annular fissure at L5-S1 on the LEFT. It is unclear if  this could be symptomatic.  No appreciable asymmetry in size or signal of the psoas muscles.   Electronically Signed   By: Rolla Flatten M.D.   On: 01/07/2014 20:49   Dg Chest Port 1 View  01/18/2014   CLINICAL DATA:  Tachycardia, chest pain, shortness of breath, unspecified drug use  EXAM: PORTABLE CHEST - 1 VIEW  COMPARISON:  Portable exam 1348 hr compared to 01/12/2014  FINDINGS: Enlargement of cardiac silhouette with slight pulmonary vascular congestion.  Mediastinal contours stable.  Peribronchial thickening with minimal RIGHT basilar atelectasis versus  infiltrate little changed.  Remaining lungs clear.  No pleural effusion or pneumothorax.  IMPRESSION: Bronchitic changes with persistent atelectasis versus infiltrate at RIGHT base.   Electronically Signed   By: Lavonia Dana M.D.   On: 01/18/2014 13:59   Dg Chest Port 1 View  01/11/2014   CLINICAL DATA:  Pneumonia.  Right-sided chest tube.  EXAM: PORTABLE CHEST - 1 VIEW  COMPARISON:  01/10/2014.  FINDINGS: Pigtail catheter in the inferior right hemi thorax is stable. There is surrounding right lung base consolidation. The lungs show diffuse irregular interstitial densities, also unchanged. No pneumothorax.  Cardiac silhouette is mildly enlarged.  IMPRESSION: No change from the previous day's study.  No pneumothorax.   Electronically Signed   By: Lajean Manes M.D.   On: 01/11/2014 07:14   Dg Chest Port 1 View  01/10/2014   CLINICAL DATA:  Pneumonia. Right pleural effusion. Right chest tube.  EXAM: PORTABLE CHEST - 1 VIEW  COMPARISON:  01/09/2014.  FINDINGS: Right chest tube in stable position. Mediastinum and hilar structures are stable. Stable cardiomegaly with pulmonary venous congestion and bilateral interstitial prominence consistent with congestive heart failure. Stable small right pleural effusion. No pneumothorax. No acute osseus abnormality.  IMPRESSION: 1. Right chest tube in stable position. Stable small right pleural effusion. 2. Stable changes of congestive heart failure and pulmonary interstitial edema.   Electronically Signed   By: Marcello Moores  [place]   On: 01/10/2014 07:46   Dg Chest Port 1 View  01/09/2014   CLINICAL DATA:  Pleural effusion  EXAM: PORTABLE CHEST - 1 VIEW  COMPARISON:  January 06, 2014  FINDINGS: There is a chest tube on the right with significant interval resolution of pleural effusion. A small pleural effusion remains on the right. There is moderate generalized interstitial edema with cardiomegaly. No airspace consolidation. No bony lesions. No apparent pneumothorax.   IMPRESSION: Chest tube at right base. There is evidence of congestive heart failure with edema and cardiomegaly. There is a small residual effusion on the right. No pneumothorax apparent   Electronically Signed   By: Lowella Grip M.D.   On: 01/09/2014 07:00   Dg Chest Port 1 View  01/06/2014   CLINICAL DATA:  Status post right chest tube removal  EXAM: PORTABLE CHEST - 1 VIEW  COMPARISON:  Portable chest x-ray of January 04, 2014  FINDINGS: There has been interval removal of the small caliber chest tube on the right. There remains pleural thickening or fluid along the right lateral thoracic wall and at the right lung base. There is no pneumothorax. The left lung is adequately inflated and grossly clear. The cardiac silhouette is top-normal in size but stable. The central pulmonary vascularity is prominent but also stable.  IMPRESSION: There is no evidence of significant additional pleural fluid reaccumulation nor development of a pneumothorax since removal of the right-sided chest tube.   Electronically Signed   By: Marca Gadsby  [place]  On: 01/06/2014 12:22   Dg Chest Port 1 View  01/04/2014   CLINICAL DATA:  Shortness of breath, cough, large pleural effusion  EXAM: PORTABLE CHEST - 1 VIEW  COMPARISON:  Portable exam 0948 hr compared to 0515 hr  FINDINGS: Pigtail RIGHT thoracostomy tube unchanged.  Enlargement of cardiac silhouette.  Mediastinal contours and pulmonary vascularity normal.  Persistent RIGHT pleural effusion and basilar atelectasis.  Lungs otherwise clear.  No pneumothorax or LEFT pleural effusion identified.  IMPRESSION: Persistent RIGHT pleural effusion post thoracostomy tube placement.  Persistent atelectasis RIGHT base, little changed from earlier study.   Electronically Signed   By: Lavonia Dana M.D.   On: 01/04/2014 09:55   Dg Chest Port 1 View  01/04/2014   CLINICAL DATA:  Followup pleural effusion. Status post pigtail drainage.  EXAM: PORTABLE CHEST - 1 VIEW  COMPARISON:  12/2013.   FINDINGS: Cardiac enlargement persists. Pigtail catheter missing good position. Considerable effusion remains inferior to the location of the drainage catheter. There is consolidation and atelectasis at the RIGHT mid and lower lung zones. The bones are unremarkable.  IMPRESSION: Stable chest. Considerable right-sided pleural effusion remains, unchanged from 01/03/2014.   Electronically Signed   By: Rolla Flatten M.D.   On: 01/04/2014 07:18   Dg Chest Port 1 View  01/03/2014   CLINICAL DATA:  Right pleural effusion  EXAM: PORTABLE CHEST - 1 VIEW  COMPARISON:  Chest x-ray from yesterday  FINDINGS: Significant decrease in a right pleural effusion. The pleural fluid volume is now moderate, located along the lower and lateral right chest. Some fissural fluid is present. There is no overt re-expansion pulmonary edema. There has been relatively good re-expansion of the right lung. Streaky retrocardiac opacity, similar to previous.  Unchanged cardiomegaly. Stable upper mediastinal contours, likely with loculated pleural fluid along the peritracheal stripe.  IMPRESSION: Significant decrease in right pleural effusion with good re-expansion of the right lung.   Electronically Signed   By: Jorje Guild M.D.   On: 01/03/2014 05:34   Dg Chest Port 1 View  01/02/2014   CLINICAL DATA:  Right chest tube placement  EXAM: PORTABLE CHEST - 1 VIEW  COMPARISON:  01/02/2014  FINDINGS: Large right pleural effusion is unchanged. Right lower lobe is collapsed. Small amount of aerated right upper lobe unchanged. Pigtail catheter overlying the right chest is unchanged. No pneumothorax.  Left lung is clear.  Cardiac enlargement.  IMPRESSION: Large right effusion and collapse of the right lower lobe unchanged. Right pigtail catheter unchanged in position.   Electronically Signed   By: Franchot Gallo M.D.   On: 01/02/2014 14:24   Dg Chest Port 1 View  01/02/2014   CLINICAL DATA:  Right pleural drainage catheter  EXAM: PORTABLE CHEST -  1 VIEW  COMPARISON:  Chest x-ray from yesterday  FINDINGS: A large right pleural effusion, which is loculated by CT, shows slight interval decrease in volume. Leftward mediastinal shift persists. Congested appearance of the left lung vessels likely related to extensive lung compression on the right. A right-sided pleural drainage catheter is in unchanged position, located at the mid to upper chest level. Stable heart size and mediastinal contours.  IMPRESSION: Mild decrease in a still large right pleural effusion.   Electronically Signed   By: Jorje Guild M.D.   On: 01/02/2014 05:44   Dg Chest Port 1 View  01/01/2014   CLINICAL DATA:  Chest tube placement.  EXAM: PORTABLE CHEST - 1 VIEW  COMPARISON:  CT 12/31/2013  FINDINGS: Right chest tube noted over the right upper chest. Prominent right pleural effusion remains. No pneumothorax. Left lung is clear. Cardiac structures are unremarkable. No acute bony abnormality.  IMPRESSION: Interim placement of right chest tube, its tip is projected over the right upper chest. Persistent large right pleural effusion.   Electronically Signed   By: [place]   On: 01/01/2014 10:18   Dg Knee Left Port  01/02/2014   CLINICAL DATA:  Left knee pain after fall.  EXAM: PORTABLE LEFT KNEE - 1-2 VIEW  COMPARISON:  December 20, 2013.  FINDINGS: There is no evidence of fracture, dislocation, or joint effusion. There is no evidence of arthropathy or other focal bone abnormality. Soft tissues are unremarkable.  IMPRESSION: Normal left knee.   Electronically Signed   By: Sabino Dick M.D.   On: 01/02/2014 11:42   Ct Image Guided Fluid Drain By Catheter  01/08/2014   CLINICAL DATA:  Loculated right pleural effusion  EXAM: CT-GUIDED RIGHT POSTERIOR 10 FRENCH CHEST TUBE INSERTION  Date:  8/25/20158/25/2015 2:05 pm  Radiologist:  M. Daryll Brod, MD  Guidance:  CT  FLUOROSCOPY TIME:  None.  MEDICATIONS AND MEDICAL HISTORY: 2 mg Versed, 50 mcg fentanyl  ANESTHESIA/SEDATION: 15 min   CONTRAST:  None.  COMPLICATIONS: No immediate  PROCEDURE: Informed consent was obtained from the patient following explanation of the procedure, risks, benefits and alternatives. The patient understands, agrees and consents for the procedure. All questions were addressed. A time out was performed.  Maximal barrier sterile technique utilized including caps, mask, sterile gowns, sterile gloves, large sterile drape, hand hygiene, and Betadine.  Previous imaging reviewed. Patient positioned right anterior oblique. Noncontrast localization CT performed. The posterior loculated right pleural effusion was localized. Under sterile conditions and local anesthesia, an 18 gauge introducer needle was advanced from a lateral intercostal approach into the fluid collection. Needle position confirmed with CT. Syringe aspiration yielded pleural fluid. Guidewire advanced followed by dilatation to insert a 10 [place] drain. Catheter position confirmed with CT. No immediate complication. Patient tolerated the procedure well. Catheter secured with a Prolene suture and connected to external pleura vac.  IMPRESSION: Successful CT-guided 10 French right chest drain insertion for a loculated pleural effusion.   Electronically Signed   By: Daryll Brod M.D.   On: 01/08/2014 15:27         Subjective: The patient stated that he still did not want IV access to be placed today. He is agreeable to have IV access on 01/23/2014 if his condition worsened.  Patient denies fevers, chills, headache, chest pain, dyspnea, nausea, vomiting, diarrhea, abdominal pain, dysuria, hematuria   Objective: Filed Vitals:   01/21/14 1408 01/21/14 2057 01/21/14 2256 01/22/14 1444  BP: 149/89 132/92  139/83  Pulse: 97 119  115  Temp: 98.7 F (37.1 C)  98.9 F (37.2 C) 99 F (37.2 C)  TempSrc: Oral  Oral Oral  Resp: 20 20  18   Height:      Weight:      SpO2: 97% 98%  98%    Intake/Output Summary (Last 24 hours) at 01/22/14 1750 Last data  filed at 01/22/14 1407  Gross per 24 hour  Intake    840 ml  Output      0 ml  Net    840 ml   Weight change:  Exam:   General:  Pt is alert, follows commands appropriately, not in acute distress  HEENT: No icterus, No thrush,  /AT  Cardiovascular: RRR,  S1/S2, no rubs, no gallops  Respiratory: Bibasilar crackles, diminished breath sound right base.left clear to auscultation.   Abdomen: Soft/+BS, non tender, non distended, no guarding  Extremities: No edema, No lymphangitis, No petechiae, No rashes, no synovitis  Data Reviewed: Basic Metabolic Panel:  Recent Labs Lab 01/18/14 1306 01/19/14 0418 01/20/14 1300 01/21/14 0830 01/22/14 0443  NA 139 140 141 142 138  K 3.4* 3.3* 3.5* 3.5* 3.4*  CL 97 104 103 105 100  CO2 21 22 25 25 24   GLUCOSE 185* 99 103* 99 93  BUN 15 13 7  5* 7  CREATININE 1.48* 1.21 1.09 1.06 1.52*  CALCIUM 10.6* 9.2 9.3 9.3 9.8  MG  --   --  2.0  --   --    Liver Function Tests:  Recent Labs Lab 01/18/14 1306  AST 25  ALT 23  ALKPHOS 100  BILITOT 0.7  PROT 8.8*  ALBUMIN 3.6   No results found for this basename: LIPASE, AMYLASE,  in the last 168 hours No results found for this basename: AMMONIA,  in the last 168 hours CBC:  Recent Labs Lab 01/18/14 1306 01/19/14 0418 01/20/14 1300 01/21/14 0830 01/22/14 0443  WBC 20.7* 14.6* 11.0* 10.7* 14.3*  NEUTROABS 18.3*  --   --   --   --   HGB 13.4 10.9* 11.0* 10.7* 11.0*  HCT 39.3 33.1* 33.0* 32.5* 32.9*  MCV 88.3 88.3 88.2 88.6 88.9  PLT 610* 417* 444* 367 350   Cardiac Enzymes: No results found for this basename: CKTOTAL, CKMB, CKMBINDEX, TROPONINI,  in the last 168 hours BNP: No components found with this basename: POCBNP,  CBG:  Recent Labs Lab 01/18/14 1243 01/21/14 2253  GLUCAP 192* 163*    Recent Results (from the past 240 hour(s))  CULTURE, BLOOD (ROUTINE X 2)     Status: None   Collection Time    01/18/14  1:04 PM      Result Value Ref Range Status   Specimen  Description BLOOD RIGHT ANTECUBITAL   Final   Special Requests BOTTLES DRAWN AEROBIC AND ANAEROBIC 5ML   Final   Culture  Setup Time     Final   Value: 01/18/2014 20:45     Performed at [facility]   Culture     Final   Value:        BLOOD CULTURE RECEIVED NO GROWTH TO DATE CULTURE WILL BE HELD FOR 5 DAYS BEFORE ISSUING A FINAL NEGATIVE REPORT     Performed at [facility]   Report Status PENDING   Incomplete  CULTURE, BLOOD (ROUTINE X 2)     Status: None   Collection Time    01/18/14  1:07 PM      Result Value Ref Range Status   Specimen Description BLOOD RIGHT ARM   Final   Special Requests BOTTLES DRAWN AEROBIC ONLY 1ML   Final   Culture  Setup Time     Final   Value: 01/18/2014 20:45     Performed at [facility]   Culture     Final   Value: STAPHYLOCOCCUS SPECIES (COAGULASE NEGATIVE)     Note: CRITICAL RESULT CALLED TO, READ BACK BY AND VERIFIED WITH: EVELYN M RN @1109PM  Thelma Comp [id number] THE SIGNIFICANCE OF ISOLATING THIS ORGANISM FROM A SINGLE SET OF BLOOD CULTURES WHEN MULTIPLE SETS ARE DRAWN IS UNCERTAIN. PLEASE NOTIFY THE MICROBIOLOGY      DEPARTMENT WITHIN ONE WEEK IF SPECIATION AND SENSITIVITIES ARE REQUIRED.  Performed at [facility]   Report Status 01/21/2014 FINAL   Final  URINE CULTURE     Status: None   Collection Time    01/18/14  3:43 PM      Result Value Ref Range Status   Specimen Description URINE, RANDOM   Final   Special Requests NONE   Final   Culture  Setup Time     Final   Value: 01/18/2014 20:05     Performed at [place]     Final   Value: NO GROWTH     Performed at [facility]   Culture     Final   Value: NO GROWTH     Performed at [facility]   Report Status 01/19/2014 FINAL   Final     Scheduled Meds: . amLODipine  5 mg Oral Daily  . enoxaparin (LOVENOX) injection  40 mg Subcutaneous Q24H  . folic acid  1 mg Oral Daily  . haloperidol  2 mg Oral TID  . levofloxacin   750 mg Oral Q24H  . multivitamin with minerals  1 tablet Oral Daily  . thiamine  100 mg Oral Daily   Or  . thiamine  100 mg Intravenous Daily   Continuous Infusions: . sodium chloride 0.9 % 1,000 mL with potassium chloride 20 mEq infusion 75 mL/hr at 01/20/14 2207     Vashon Arch, DO  [facility] Pager [phone]  If 7PM-7AM, please contact night-coverage [email or web address] Password [email or web address] 01/22/2014, 5:50 PM   LOS: 4 days

## 2014-01-22 NOTE — Progress Notes (Signed)
Clinical Social Work Department CLINICAL SOCIAL WORK PSYCHIATRY SERVICE LINE ASSESSMENT 01/22/2014  Patient:  Ralph Chavez  Account:  [id number]  Admit Date:  01/18/2014  Clinical Social Worker:  Sindy Messing, LCSW  Date/Time:  01/22/2014 02:00 PM Referred by:  Physician  Date referred:  01/22/2014 Reason for Referral  Psychosocial assessment   Presenting Symptoms/Problems (In the person's/family's own words):   Psych consulted due to confusion and substance abuse.   Abuse/Neglect/Trauma History (check all that apply)  Denies history   Abuse/Neglect/Trauma Comments:   Psychiatric History (check all that apply)  Residential treatment   Psychiatric medications:  Haldol 2 mg  Ativan 0-4 mg   Current Mental Health Hospitalizations/Previous Mental Health History:   Patient denies any previous MH diagnosis.   Current provider:   None reported   Place and Date:   N/A   Current Medications:   Scheduled Meds:      . amLODipine  5 mg Oral Daily  . enoxaparin (LOVENOX) injection  40 mg Subcutaneous Q24H  . folic acid  1 mg Oral Daily  . haloperidol  2 mg Oral TID  . levofloxacin  750 mg Oral Q24H  . LORazepam  0-4 mg Intravenous Q12H  . multivitamin with minerals  1 tablet Oral Daily  . thiamine  100 mg Oral Daily   Or     . thiamine  100 mg Intravenous Daily        Continuous Infusions:      . sodium chloride 0.9 % 1,000 mL with potassium chloride 20 mEq infusion 75 mL/hr at 01/20/14 2207          PRN Meds:.acetaminophen, acetaminophen, haloperidol lactate, haloperidol lactate, LORazepam, LORazepam, ondansetron (ZOFRAN) IV, ondansetron, oxyCODONE       Previous Impatient Admission/Date/Reason:   Patient reports that he has been to [facility] in the past.   Emotional Health / Current Symptoms    Suicide/Self Harm  None reported   Suicide attempt in the past:   Patient denies any previous attempts. Patient denies any SI or HI.   Other harmful behavior:   Per  chart review, patient has been hostile with staff and threatening at times.   Psychotic/Dissociative Symptoms  None reported   Other Psychotic/Dissociative Symptoms:   N/A    Attention/Behavioral Symptoms  Inattentive   Other Attention / Behavioral Symptoms:   Patient disengaged during assessment and has be redirected often.    Cognitive Impairment  Orientation - Time  Orientation - Self  Orientation - Place   Other Cognitive Impairment:   Patient alert when talking with CSW.    Mood and Adjustment  Flat    Stress, Anxiety, Trauma, Any Recent Loss/Stressor  None reported   Anxiety (frequency):   N/A   Phobia (specify):   N/A   Compulsive behavior (specify):   N/A   Obsessive behavior (specify):   N/A   Other:   N/A   Substance Abuse/Use  Current substance use   SBIRT completed (please refer for detailed history):  N  Self-reported substance use:   Patient does admit to substance use but does not want to elaborate on details. Patient reports that he has been to treatment in the past and knows that the doctors want him to be sober. Patient reports he will consider treatment options but is not interested in any specific treatment at this time. Patient declined to complete SBIRT.   Urinary Drug Screen Completed:  Y Alcohol level:   <11  Environmental/Housing/Living Arrangement  Stable housing   Who is in the home:   Alone   Emergency contact:  [place]   Patient's Strengths and Goals (patient's own words):   Patient reports he has supportive friends.   Clinical Social Worker's Interpretive Summary:   CSW received referral to complete psychosocial assessment. CSW reviewed chart and met with patient at bedside. CSW introduced myself and explained role.    Patient reports he does not remember coming to the hospital and does not know how he got here. Patient reports his leg is hurting so he is unsure if he was in an  accident. Per chart review, when police arrived on scene to get patient, his leg was stuck between a concrete barrier and a fence. Patient reports that his leg is feeling a little bit better but hopes it does not hurt his ability to walk around.    Patient reports he lives by himself because he does not like staying with his girlfriend all of the time. Patient reports he stays at different hotels and with friends. Patient has been dating his girlfriend for the past 2.5 years but does not like that girlfriend has a 67 year old child. Patient reports that his girlfriend or friends will help him at DC if he needs it.    CSW and patient spoke about patient's substance use. Patient does admit to substance use but tells CSW "its none of business" re: how much and what he consumes. Patient does admit to receiving treatment at [facility] in the past but does not believe he needs that treatment now. Patient aware of recommendation to remain sober but patient reports he does not know his plans at DC. CSW spoke with patient about substance abuse resources but patient declines any assistance.    CSW will continue to follow to assist throughout hospital stay.   Disposition:  Recommend Psych CSW continuing to support while in hospital   [place], [place] [phone]

## 2014-01-22 NOTE — Progress Notes (Signed)
Patient transferred to [place] from [place], alert and oriented, transferred on hospital bed, tolerated well, report given to receiving Nurse via telephone, BP=132/92, P=97, Sats=98 % on RA, Sitter at bedside to travel with patient, fall risk r/t pain in left knee and back, restlessness and agitation decreased, patient in stable condition at this time

## 2014-01-23 ENCOUNTER — Inpatient Hospital Stay (HOSPITAL_COMMUNITY): Payer: Self-pay

## 2014-01-23 DIAGNOSIS — J90 Pleural effusion, not elsewhere classified: Secondary | ICD-10-CM

## 2014-01-23 NOTE — Progress Notes (Signed)
Clinical Social Work  CSW met with patient at bedside. Patient sitting in chair when CSW arrived and agreeable to session. Patient reports that he is feeling a little bit better but has not decided on DC plans. Patient reports he has decided that he needs to stay sober and does not want to "drink and drug it" anymore. CSW spoke with patient about his plans to remain sober but patient reports he has not thought about it in detail. Patient reports that friends and family do not drink or use any substances so he feels they will be supportive of his decision. CSW spoke with patient again about SA rehab but patient reports he is not interested at this time. Per chart review, PT will evaluate patient to determine needs at DC. CSW will continue to follow.  [place], [place] [phone]

## 2014-01-23 NOTE — Consult Note (Signed)
Psychiatry Consult   Reason for Consult:  Capacity  Referring Physician:  Dr.[name] is an 43 y.o. male. Total Time spent with patient: 20 minutes  Assessment: AXIS I:  Psychotic Disorder NOS, Substance Abuse and Substance Induced Mood Disorder AXIS II:  Deferred AXIS III:   Past Medical History  Diagnosis Date  . Anxiety   . Ulcer    AXIS IV:  problems related to social environment AXIS V:  31-40 impairment in reality testing  Plan:  Patient does not meet criteria for psychiatric inpatient admission.  Subjective:   Ralph Chavez is a 43 y.o. male patient admitted with  Empyema, confusion, substance abuse.  HPI:   Patient seen chart reviewed.  He has seen by psychiatry consultation liaison services for psychosis and confusion.  Consult was called because of capacity .  Patient remains very confused and irrational sometimes.  He maintained limited eye contact.  He has thought blocking and he continues to provide very limited information about himself .  He does not know his psychiatrist's name and his previous medication.  He do not remember what circumstances brought him to the hospital because he believes that he needs methadone program .  [place] per [place]  He was recently admitted but left AMA while being treated for empyema.  He has been confused and paranoid.  Patient admitted that he has taken crystal meth and drugs in the past but he was unable to provide any information.  He appears talking to himself.    Past Psychiatric History: Past Medical History  Diagnosis Date  . Anxiety   . Ulcer     reports that he has been smoking Cigarettes.  He has been smoking about 0.00 packs per day. He has never used smokeless tobacco. He reports that he drinks alcohol. He reports that he uses illicit drugs (Cocaine and Heroin). History reviewed. No pertinent family history.   Living Arrangements: Other (Comment) (notes in computer state pt is  staying at possible drug house, pt states he is homeless)   Abuse/Neglect [facility]) Physical Abuse: Denies Verbal Abuse: Denies Sexual Abuse: Denies Allergies:  No Known Allergies   Objective: Blood pressure 140/91, pulse 104, temperature 98.5 F (36.9 C), temperature source Oral, resp. rate 18, height 5' 10"  (1.778 m), weight 199 lb 4.7 oz (90.4 kg), SpO2 99.00%.Body mass index is 28.6 kg/(m^2). Results for orders placed during the hospital encounter of 01/18/14 (from the past 72 hour(s))  BASIC METABOLIC PANEL     Status: Abnormal   Collection Time    01/21/14  8:30 AM      Result Value Ref Range   Sodium 142  137 - 147 mEq/L   Potassium 3.5 (*) 3.7 - 5.3 mEq/L   Chloride 105  96 - 112 mEq/L   CO2 25  19 - 32 mEq/L   Glucose, Bld 99  70 - 99 mg/dL   BUN 5 (*) 6 - 23 mg/dL   Creatinine, Ser 1.06  0.50 - 1.35 mg/dL   Calcium 9.3  8.4 - 10.5 mg/dL   GFR calc non Af Amer 86 (*) >90 mL/min   GFR calc Af Amer >90  >90 mL/min   Comment: (NOTE)     The eGFR has been calculated using the CKD EPI equation.     This calculation has not been validated in all clinical situations.     eGFR's persistently <90 mL/min signify possible Chronic Kidney  Disease.   Anion gap 12  5 - 15  CBC     Status: Abnormal   Collection Time    01/21/14  8:30 AM      Result Value Ref Range   WBC 10.7 (*) 4.0 - 10.5 K/uL   RBC 3.67 (*) 4.22 - 5.81 MIL/uL   Hemoglobin 10.7 (*) 13.0 - 17.0 g/dL   HCT 32.5 (*) 39.0 - 52.0 %   MCV 88.6  78.0 - 100.0 fL   MCH 29.2  26.0 - 34.0 pg   MCHC 32.9  30.0 - 36.0 g/dL   RDW 14.8  11.5 - 15.5 %   Platelets 367  150 - 400 K/uL  GLUCOSE, CAPILLARY     Status: Abnormal   Collection Time    01/21/14 10:53 PM      Result Value Ref Range   Glucose-Capillary 163 (*) 70 - 99 mg/dL  BASIC METABOLIC PANEL     Status: Abnormal   Collection Time    01/22/14  4:43 AM      Result Value Ref Range   Sodium 138  137 - 147 mEq/L   Potassium 3.4 (*) 3.7 - 5.3 mEq/L   Chloride  100  96 - 112 mEq/L   CO2 24  19 - 32 mEq/L   Glucose, Bld 93  70 - 99 mg/dL   BUN 7  6 - 23 mg/dL   Creatinine, Ser 1.52 (*) 0.50 - 1.35 mg/dL   Calcium 9.8  8.4 - 10.5 mg/dL   GFR calc non Af Amer 55 (*) >90 mL/min   GFR calc Af Amer 64 (*) >90 mL/min   Comment: (NOTE)     The eGFR has been calculated using the CKD EPI equation.     This calculation has not been validated in all clinical situations.     eGFR's persistently <90 mL/min signify possible Chronic Kidney     Disease.   Anion gap 14  5 - 15  CBC     Status: Abnormal   Collection Time    01/22/14  4:43 AM      Result Value Ref Range   WBC 14.3 (*) 4.0 - 10.5 K/uL   RBC 3.70 (*) 4.22 - 5.81 MIL/uL   Hemoglobin 11.0 (*) 13.0 - 17.0 g/dL   HCT 32.9 (*) 39.0 - 52.0 %   MCV 88.9  78.0 - 100.0 fL   MCH 29.7  26.0 - 34.0 pg   MCHC 33.4  30.0 - 36.0 g/dL   RDW 14.9  11.5 - 15.5 %   Platelets 350  150 - 400 K/uL   Labs are reviewed and are pertinent for elevated wbc  Current Facility-Administered Medications  Medication Dose Route Frequency Provider Last Rate Last Dose  . acetaminophen (TYLENOL) tablet 650 mg  650 mg Oral Q6H PRN Annita Brod, MD   650 mg at 01/21/14 2139   Or  . acetaminophen (TYLENOL) suppository 650 mg  650 mg Rectal Q6H PRN Annita Brod, MD      . amLODipine (NORVASC) tablet 5 mg  5 mg Oral Daily Annita Brod, MD   5 mg at 01/23/14 0929  . enoxaparin (LOVENOX) injection 40 mg  40 mg Subcutaneous Q24H Annita Brod, MD   40 mg at 01/22/14 2100  . folic acid (FOLVITE) tablet 1 mg  1 mg Oral Daily Annita Brod, MD   1 mg at 01/23/14 0929  . haloperidol (HALDOL) tablet 2 mg  2 mg Oral TID Levonne Spiller, MD   2 mg at 01/23/14 0929  . haloperidol lactate (HALDOL) injection 5 mg  5 mg Intravenous Q6H PRN Orson Eva, MD       And  . haloperidol lactate (HALDOL) injection 5 mg  5 mg Intramuscular Q6H PRN Orson Eva, MD   5 mg at 01/23/14 1210  . levofloxacin (LEVAQUIN) tablet 750 mg  750 mg  Oral Q24H Orson Eva, MD   750 mg at 01/22/14 2101  . LORazepam (ATIVAN) tablet 2 mg  2 mg Oral Q6H PRN Orson Eva, MD   2 mg at 01/22/14 1045   Or  . LORazepam (ATIVAN) injection 2 mg  2 mg Intramuscular Q6H PRN Orson Eva, MD      . multivitamin with minerals tablet 1 tablet  1 tablet Oral Daily Annita Brod, MD   1 tablet at 01/23/14 0929  . ondansetron (ZOFRAN) tablet 4 mg  4 mg Oral Q6H PRN Annita Brod, MD       Or  . ondansetron [facility]) injection 4 mg  4 mg Intravenous Q6H PRN Annita Brod, MD      . oxyCODONE (Oxy IR/ROXICODONE) immediate release tablet 5 mg  5 mg Oral Q4H PRN Annita Brod, MD   5 mg at 01/22/14 1749  . sodium chloride 0.9 % 1,000 mL with potassium chloride 20 mEq infusion   Intravenous Continuous Orson Eva, MD 75 mL/hr at 01/20/14 2207    . thiamine (VITAMIN B-1) tablet 100 mg  100 mg Oral Daily Annita Brod, MD   100 mg at 01/23/14 9983   Or  . thiamine (B-1) injection 100 mg  100 mg Intravenous Daily Annita Brod, MD        Psychiatric Specialty Exam: Physical Exam  Constitutional: He appears well-developed and well-nourished.  HENT:  Head: Normocephalic.  Eyes: Pupils are equal, round, and reactive to light.  Neck: Normal range of motion. Neck supple.  Musculoskeletal: Normal range of motion.  Neurological: He is alert.  Skin: Skin is warm and dry.    Review of Systems  HENT: Negative.   Eyes: Negative.   Respiratory: Positive for wheezing.   Cardiovascular: Negative.   Gastrointestinal: Negative.   Genitourinary: Negative.   Skin: Negative.   Neurological: Positive for weakness.  Endo/Heme/Allergies: Negative.   Psychiatric/Behavioral: Positive for hallucinations and substance abuse. The patient is nervous/anxious.     Blood pressure 140/91, pulse 104, temperature 98.5 F (36.9 C), temperature source Oral, resp. rate 18, height 5' 10"  (1.778 m), weight 199 lb 4.7 oz (90.4 kg), SpO2 99.00%.Body mass index is 28.6  kg/(m^2).  General Appearance: Casual  Eye Contact::  Poor  Speech:  Garbled  Volume:  decreased  Mood:  drowsy  Affect:  constricted  Thought Process:  Disorganized, Loose and Tangential  Orientation:  Other:  not oriented to place  Thought Content:  Paranoid Ideation  Suicidal Thoughts:  No  Homicidal Thoughts:  No  Memory:  Immediate;   Poor Recent;   Poor Remote;   Poor  Judgement:  Impaired  Insight:  Lacking  Psychomotor Activity:  Decreased  Concentration:  Poor  Recall:  Poor  Fund of Knowledge:Fair  Language: Fair  Akathisia:  No  Handed:  Right  AIMS (if indicated):     Assets:  Communication Skills Housing Social Support  Sleep:      Musculoskeletal: Strength & Muscle Tone: within normal limits Gait & Station: unsteady Patient leans:  N/A  Treatment Plan Summary: Patient does not have capacity to participate in his treatment plan.  Consider increasing Haldol dose 5 mg twice a day if it is not medically contraindicated.  Please call [phone] for psychiatry follow up if needed  Dickie Labarre T. 01/23/2014 4:41 PM

## 2014-01-23 NOTE — Progress Notes (Signed)
Pt waved at me as I passed by his room so I stopped in to say hello. He seemed calm, bored, restless, and expressed disorientation. Expressed desire to be visited again by a chaplain on another day.  Will bring to attention of spiritual care staff and follow as needed.   01/23/14 1100  Clinical Encounter Type  Visited With Patient  Visit Type Initial  Spiritual Encounters  Spiritual Needs Emotional  Stress Factors  Patient Stress Factors None identified   Mariah Milling, Chaplain 01/23/2014 11:40 AM

## 2014-01-23 NOTE — Progress Notes (Signed)
[facility] PROGRESS NOTE  Mariel Gaudin ZOX:[id number] DOB: 1970-12-21 DOA: 01/18/2014 PCP: No PCP Per Patient  Interim summary  43 year old male with a history of hypertension, pneumonia, tobacco abuse, alcohol abuse and polysubstance abuse who had 2 previous admissions to the hospital in August 2015. On his admission from 12/20/2013 through 12/25/2013 the patient had an elevated troponin and underwent cardiac catheterization on 12/24/2013 which showed clean coronaries. During that hospitalization, the patient also had aspiration pneumonitis and pulmonary edema. He was discharged with 5 days of Augmentin. The patient was re-admitted on 12/31/2013 until he left [place] on 01/11/2014. During that admission, the patient had acute respiratory failure secondary to pneumonia with loculated pleural effusion, and he required placement of a right-sided chest tube on 01/01/2014 he had his pigtail catheter removed on August 23. He also had a thoracocentesis 01/04/2014 due to residual loculated collection in the right lung that was not drained by the initial chest tube.. Because of persistent pleural fluid, another R-chest tube was placed by IR on 01/08/2014. He was treated with initially with vancomycin and cefepime. After initially 4 days of vancomycin, cefepime was continued through the hospitalization. All his cultures including blood culture and pleural fluid cultures were negative, and cytology on 01/04/2014 was negative for malignancy. His second chest tube was ultimately discontinued on 01/11/2014 and he was transitioned to oral levofloxacin. Pulmonary followup was set up with Dr. Halford Chessman for 02/01/14@noon . The patient represented to the emergency department on 01/18/2014 because of confusion. His urine drug screen showed only benzodiazepines, and his alcohol level was negative. The patient was brought to the emergency department by the [facility] and paramedics where he was quite  confused and found to have a questionable right lower lobe infiltrate with WBC 20.7 and tachycardia. At the beginning of the hospital admission, the patient was very paranoid and belligerent. The patient continues to be easily irritable, but has improved with Haldol. He has revealed that he has been using crystal meth and cocaine prior to the admission      Assessment/Plan: #1 sepsis Present at the time of admission secondary to healthcare associated pneumonia/empyema. Patient pulled out IV lines and after several attempts refused further placement of IV and has been subsequently transitioned to oral Levaquin.  #2 HCAP/empyema Patient left AMA on 01/11/2049 unclear as to whether patient was compliant with his antibiotics. Patient had received IV cefepime from 12/31/2013 2 01/11/2014 at prior admission. 01/22/2014 WBC is increased patient did not want IV to be placed but stated he will reconsider. Currently on oral Levaquin. Will repeat labs in the morning. Continue flutter valve, bronchial hygiene, bronchodilators. A white count continues to increase may need another chest tube placement by our secondary to possible reaccumulation of effusion per chest x-ray.  #3 bacteremia Likely contaminant. IV vancomycin has been discontinued.  #4 acute encephalopathy Likely secondary to sepsis and drug induced psychosis. Patient is less belligerent and less verbally abusive. Continue alcohol withdrawal protocol. Patient has been seen by psychiatry. Continue scheduled Haldol and as needed Haldol for agitation.  #5 acute renal insufficiency Patient refused labs this morning did not want any IV access. Repeat labs in the morning. Renal function is the worse we'll hydrate with IV fluids as IV and check a fractional excretion of sodium.  #6 Impaired glucose tolerance Hgb A1C 5.8  #7 hypertension Stable continue Norvasc.  #8 hypokalemia No labs today. Will repeat labs in the morning.   Code Status:  Full Family Communication: Updated patient no  family at bedside. Disposition Plan: Home vs SNF when medically stable.   Consultants:  None  Procedures:  Chest x-ray 01/23/2014, 01/18/2014  Antibiotics: Vancomycin 01/18/14>>> 01/21/2014  Zosyn 01/18/14>>>01/21/14  Levofloxacin 01/21/14>>>    HPI/Subjective: Patient with no complaints. Alert to self and time.  Objective: Filed Vitals:   01/23/14 0613  BP: 142/84  Pulse: 97  Temp: 98.2 F (36.8 C)  Resp: 20    Intake/Output Summary (Last 24 hours) at 01/23/14 1141 Last data filed at 01/23/14 9242  Gross per 24 hour  Intake    600 ml  Output    300 ml  Net    300 ml   Filed Weights   01/18/14 1728  Weight: 90.4 kg (199 lb 4.7 oz)    Exam:   General:  NAD  Cardiovascular: RRR  Respiratory: CTAB  Abdomen: Soft/NT/ND/+BS  Musculoskeletal: No c/c/e   Data Reviewed: Basic Metabolic Panel:  Recent Labs Lab 01/18/14 1306 01/19/14 0418 01/20/14 1300 01/21/14 0830 01/22/14 0443  NA 139 140 141 142 138  K 3.4* 3.3* 3.5* 3.5* 3.4*  CL 97 104 103 105 100  CO2 21 22 25 25 24   GLUCOSE 185* 99 103* 99 93  BUN 15 13 7  5* 7  CREATININE 1.48* 1.21 1.09 1.06 1.52*  CALCIUM 10.6* 9.2 9.3 9.3 9.8  MG  --   --  2.0  --   --    Liver Function Tests:  Recent Labs Lab 01/18/14 1306  AST 25  ALT 23  ALKPHOS 100  BILITOT 0.7  PROT 8.8*  ALBUMIN 3.6   No results found for this basename: LIPASE, AMYLASE,  in the last 168 hours No results found for this basename: AMMONIA,  in the last 168 hours CBC:  Recent Labs Lab 01/18/14 1306 01/19/14 0418 01/20/14 1300 01/21/14 0830 01/22/14 0443  WBC 20.7* 14.6* 11.0* 10.7* 14.3*  NEUTROABS 18.3*  --   --   --   --   HGB 13.4 10.9* 11.0* 10.7* 11.0*  HCT 39.3 33.1* 33.0* 32.5* 32.9*  MCV 88.3 88.3 88.2 88.6 88.9  PLT 610* 417* 444* 367 350   Cardiac Enzymes: No results found for this basename: CKTOTAL, CKMB, CKMBINDEX, TROPONINI,  in the last 168 hours BNP  (last 3 results)  Recent Labs  12/24/13 0409  PROBNP 538.5*   CBG:  Recent Labs Lab 01/18/14 1243 01/21/14 2253  GLUCAP 192* 163*    Recent Results (from the past 240 hour(s))  CULTURE, BLOOD (ROUTINE X 2)     Status: None   Collection Time    01/18/14  1:04 PM      Result Value Ref Range Status   Specimen Description BLOOD RIGHT ANTECUBITAL   Final   Special Requests BOTTLES DRAWN AEROBIC AND ANAEROBIC 5ML   Final   Culture  Setup Time     Final   Value: 01/18/2014 20:45     Performed at [facility]   Culture     Final   Value:        BLOOD CULTURE RECEIVED NO GROWTH TO DATE CULTURE WILL BE HELD FOR 5 DAYS BEFORE ISSUING A FINAL NEGATIVE REPORT     Performed at [facility]   Report Status PENDING   Incomplete  CULTURE, BLOOD (ROUTINE X 2)     Status: None   Collection Time    01/18/14  1:07 PM      Result Value Ref Range Status   Specimen Description BLOOD RIGHT  ARM   Final   Special Requests BOTTLES DRAWN AEROBIC ONLY 1ML   Final   Culture  Setup Time     Final   Value: 01/18/2014 20:45     Performed at [facility]   Culture     Final   Value: STAPHYLOCOCCUS SPECIES (COAGULASE NEGATIVE)     Note: CRITICAL RESULT CALLED TO, READ BACK BY AND VERIFIED WITH: Nelia Shi RN @1109PM  Thelma Comp [id number] THE SIGNIFICANCE OF ISOLATING THIS ORGANISM FROM A SINGLE SET OF BLOOD CULTURES WHEN MULTIPLE SETS ARE DRAWN IS UNCERTAIN. PLEASE NOTIFY THE MICROBIOLOGY      DEPARTMENT WITHIN ONE WEEK IF SPECIATION AND SENSITIVITIES ARE REQUIRED.     Performed at [facility]   Report Status 01/21/2014 FINAL   Final  URINE CULTURE     Status: None   Collection Time    01/18/14  3:43 PM      Result Value Ref Range Status   Specimen Description URINE, RANDOM   Final   Special Requests NONE   Final   Culture  Setup Time     Final   Value: 01/18/2014 20:05     Performed at [place]     Final   Value: NO GROWTH     Performed at [facility]   Culture     Final   Value: NO GROWTH     Performed at [facility]   Report Status 01/19/2014 FINAL   Final     Studies: No results found.  Scheduled Meds: . amLODipine  5 mg Oral Daily  . enoxaparin (LOVENOX) injection  40 mg Subcutaneous Q24H  . folic acid  1 mg Oral Daily  . haloperidol  2 mg Oral TID  . levofloxacin  750 mg Oral Q24H  . multivitamin with minerals  1 tablet Oral Daily  . thiamine  100 mg Oral Daily   Or  . thiamine  100 mg Intravenous Daily   Continuous Infusions: . sodium chloride 0.9 % 1,000 mL with potassium chloride 20 mEq infusion 75 mL/hr at 01/20/14 2207    Principal Problem:   Sepsis Active Problems:   Polysubstance abuse   Tachycardia   Acute encephalopathy   Right lower lobe pneumonia   Hyperglycemia   Metabolic acidosis   AKI (acute kidney injury)    Time spent: [place] MD [facility] Pager [phone]. If 7PM-7AM, please contact night-coverage at [email or web address], password [facility] 01/23/2014, 11:41 AM  LOS: 5 days

## 2014-01-24 ENCOUNTER — Other Ambulatory Visit: Payer: Self-pay

## 2014-01-24 ENCOUNTER — Other Ambulatory Visit (HOSPITAL_COMMUNITY): Payer: Self-pay

## 2014-01-24 ENCOUNTER — Inpatient Hospital Stay (HOSPITAL_COMMUNITY): Payer: Self-pay

## 2014-01-24 LAB — URINE MICROSCOPIC-ADD ON

## 2014-01-24 LAB — URINALYSIS, ROUTINE W REFLEX MICROSCOPIC
BILIRUBIN URINE: NEGATIVE
Glucose, UA: NEGATIVE mg/dL
KETONES UR: NEGATIVE mg/dL
Leukocytes, UA: NEGATIVE
NITRITE: NEGATIVE
Protein, ur: NEGATIVE mg/dL
Specific Gravity, Urine: 1.01 (ref 1.005–1.030)
UROBILINOGEN UA: 0.2 mg/dL (ref 0.0–1.0)
pH: 6 (ref 5.0–8.0)

## 2014-01-24 LAB — CBC WITH DIFFERENTIAL/PLATELET
BASOS ABS: 0 10*3/uL (ref 0.0–0.1)
Basophils Relative: 0 % (ref 0–1)
Eosinophils Absolute: 0.5 10*3/uL (ref 0.0–0.7)
Eosinophils Relative: 5 % (ref 0–5)
HCT: 31.8 % — ABNORMAL LOW (ref 39.0–52.0)
HEMOGLOBIN: 10.9 g/dL — AB (ref 13.0–17.0)
Lymphocytes Relative: 12 % (ref 12–46)
Lymphs Abs: 1.4 10*3/uL (ref 0.7–4.0)
MCH: 29.4 pg (ref 26.0–34.0)
MCHC: 34.3 g/dL (ref 30.0–36.0)
MCV: 85.7 fL (ref 78.0–100.0)
MONOS PCT: 9 % (ref 3–12)
Monocytes Absolute: 1.1 10*3/uL — ABNORMAL HIGH (ref 0.1–1.0)
NEUTROS ABS: 8.8 10*3/uL — AB (ref 1.7–7.7)
NEUTROS PCT: 74 % (ref 43–77)
Platelets: 337 10*3/uL (ref 150–400)
RBC: 3.71 MIL/uL — ABNORMAL LOW (ref 4.22–5.81)
RDW: 14.6 % (ref 11.5–15.5)
WBC: 11.8 10*3/uL — AB (ref 4.0–10.5)

## 2014-01-24 LAB — BASIC METABOLIC PANEL
Anion gap: 12 (ref 5–15)
BUN: 16 mg/dL (ref 6–23)
CO2: 24 mEq/L (ref 19–32)
CREATININE: 2.13 mg/dL — AB (ref 0.50–1.35)
Calcium: 10 mg/dL (ref 8.4–10.5)
Chloride: 95 mEq/L — ABNORMAL LOW (ref 96–112)
GFR calc Af Amer: 43 mL/min — ABNORMAL LOW (ref >90)
GFR calc non Af Amer: 37 mL/min — ABNORMAL LOW (ref >90)
Glucose, Bld: 108 mg/dL — ABNORMAL HIGH (ref 70–99)
Potassium: 3.5 mEq/L — ABNORMAL LOW (ref 3.7–5.3)
Sodium: 131 mEq/L — ABNORMAL LOW (ref 137–147)

## 2014-01-24 LAB — CULTURE, BLOOD (ROUTINE X 2): Culture: NO GROWTH

## 2014-01-24 LAB — CREATININE, URINE, RANDOM: CREATININE, URINE: 81.6 mg/dL

## 2014-01-24 LAB — SODIUM, URINE, RANDOM: Sodium, Ur: 54 mEq/L

## 2014-01-24 LAB — MAGNESIUM: Magnesium: 2.1 mg/dL (ref 1.5–2.5)

## 2014-01-24 MED ORDER — POTASSIUM CHLORIDE CRYS ER 20 MEQ PO TBCR
40.0000 meq | EXTENDED_RELEASE_TABLET | Freq: Once | ORAL | Status: AC
  Administered 2014-01-24: 40 meq via ORAL
  Filled 2014-01-24: qty 2

## 2014-01-24 MED ORDER — CEFPODOXIME PROXETIL 200 MG PO TABS
200.0000 mg | ORAL_TABLET | Freq: Two times a day (BID) | ORAL | Status: DC
  Administered 2014-01-24 – 2014-01-30 (×13): 200 mg via ORAL
  Filled 2014-01-24 (×14): qty 1

## 2014-01-24 MED ORDER — PROMETHAZINE HCL 25 MG PO TABS
25.0000 mg | ORAL_TABLET | Freq: Four times a day (QID) | ORAL | Status: DC | PRN
  Administered 2014-01-28 – 2014-01-29 (×2): 25 mg via ORAL
  Filled 2014-01-24 (×2): qty 1

## 2014-01-24 MED ORDER — SODIUM CHLORIDE 0.9 % IV BOLUS (SEPSIS)
1000.0000 mL | Freq: Once | INTRAVENOUS | Status: AC
  Administered 2014-01-24: 1000 mL via INTRAVENOUS

## 2014-01-24 MED ORDER — HALOPERIDOL 5 MG PO TABS
5.0000 mg | ORAL_TABLET | Freq: Two times a day (BID) | ORAL | Status: DC
  Administered 2014-01-24 – 2014-01-25 (×3): 5 mg via ORAL
  Filled 2014-01-24 (×4): qty 1

## 2014-01-24 NOTE — Consult Note (Signed)
PULMONARY / CRITICAL CARE MEDICINE   Name: Ralph Chavez MRN: [id number] DOB: 12/12/1970   LOS 6 days PCP No PCP Per Patient   ADMISSION DATE:  01/18/2014 CONSULTATION DATE:  01/24/2014   REFERRING MD :  Rick Duff sided loculated pleural effusion  CHIEF COMPLAINT:  Per referral complaint   HPI - as elicited from [facility] and epic review 43 year old male with a history of hypertension, pneumonia, tobacco abuse, alcohol abuse and polysubstance abuse who had 2 previous admissions to the hospital in August 2015. On his admission from 12/20/2013 through 12/25/2013 the patient had an elevated troponin and underwent cardiac catheterization on 12/24/2013 which showed clean coronaries. During that hospitalization, the patient also had aspiration pneumonitis and pulmonary edema. He was discharged with 5 days of Augmentin.   The patient was re-admitted on 12/31/2013.  During this admission, the patient had acute respiratory failure secondary to pneumonia with loculated pleural effusion, and he required placement of a right-sided chest tube on 01/01/2014 he had his pigtail catheter removed on August 23. He also had a thoracocentesis 01/04/2014 due to residual loculated collection in the right lung that was not drained by the initial chest tube. CVTS consult 01/05/14 recommended - conservative mgmt with abx and  Drainage Rx. Because of persistent pleural fluid, another R-chest tube was placed by IR on 01/08/2014. He was treated with initially with vancomycin and cefepime. After initially 4 days of vancomycin, cefepime was continued through the hospitalization. All his cultures including blood culture and pleural fluid cultures were negative, and cytology on 01/04/2014 was negative for malignancy. His second chest tube was ultimately discontinued on 01/11/2014 and he was transitioned to oral levofloxacin. Pulmonary followup was set up with Dr. Halford Chessman for 02/01/14@noon  but he left [place] on 01/11/2014..   Now  the patient represented to the emergency department on 01/18/2014 because of confusion. His urine drug screen showed only benzodiazepines, and his alcohol level was negative. The patient was brought to the emergency department by the [facility] and paramedics where he was quite confused and found to have a questionable right lower lobe infiltrate with WBC 20.7 and tachycardia. At the beginning of the hospital admission, the patient was very paranoid and belligerent. The patient continues to be easily irritable, but has improved with Haldol. He has revealed that he has been using crystal meth and cocaine prior to the admission  During this admission it was noted on CXR that the right pleural effusion might be worse and PCCM consulted 01/24/14, He gives no history other than being difficult  SIGNIFICANT EVENTS: 8/06 admitted for inability to walk chest pain, rhabdomyolysis   8/06 MRI thoracic spine > no acute findings, tiny T8-9 disc protrusion w/o impingement, R lung airspace disease   8/06 MRI Thoracic spine > no acute significant findings in thoracic spine, tiny t8-t9 disc protusion, RLL pneumonia   8/10 LHC > no CAD  8/11 discharged home on augmentin   12/31/13 - Readmitted  8/18 Placement of R CT :   CT chest > large R pleural effusion with compressive atelectasis of R lung, some left shift noted   8/19 50mg  TPA intrapleural  8/20 Improved CXR post placement of TPA   8/21 CT chest > rt effusions, loculations +, RLL consolidation  8/21 R thora > 750cc removed  01/05/14 CVTS Consult - Dr Cyndia Bent - no role for surgery. Rec med mgmt and [place] gided thora 8/23 R Chest tube removed   8/24 MRI Lumbar > tiny annular fissure at  L5-S1 on LEFT 8/25 IR placed Rt chest tube, -20 cm suction  8/26 Change chest tube to -10 cm suction  8/27 Change chest tube to water seal  8/28 D/c Rt chest tube -> LEFT AMA, had fu with Dr Halford Chessman for 02/01/14 at [place]  01/18/2014 - readmit  01/24/14 - PCCM cosnult for  effusion       PAST MEDICAL HISTORY :  Past Medical History  Diagnosis Date  . Anxiety   . Ulcer    History reviewed. No pertinent past surgical history. Prior to Admission medications   Medication Sig Start Date End Date Taking? Authorizing Provider  amLODipine (NORVASC) 5 MG tablet Take 1 tablet (5 mg total) by mouth daily. 12/25/13  Yes Verlee Monte, MD  naproxen sodium (ANAPROX) 220 MG tablet Take 440 mg by mouth 2 (two) times daily as needed (pain).   Yes Historical Provider, MD  omeprazole (PRILOSEC) 20 MG capsule Take 20 mg by mouth daily.   Yes Historical Provider, MD  oxyCODONE-acetaminophen (ROXICET) 5-325 MG per tablet Take 1 tablet by mouth every 6 (six) hours as needed. 12/25/13  Yes Verlee Monte, MD   No Known Allergies  FAMILY HISTORY:  History reviewed. No pertinent family history. SOCIAL HISTORY:  reports that he has been smoking Cigarettes.  He has been smoking about 0.00 packs per day. He has never used smokeless tobacco. He reports that he drinks alcohol. He reports that he uses illicit drugs (Cocaine and Heroin).  REVIEW OF SYSTEMS:  Per HPI. Rest negative or not elicitable on account his substance abuse  SUBJECTIVE:   VITAL SIGNS: Temp:  [98.3 F (36.8 C)-98.7 F (37.1 C)] 98.3 F (36.8 C) (09/10 0515) Pulse Rate:  [100-106] 106 (09/10 0515) Resp:  [16-18] 18 (09/10 0515) BP: (140-147)/(91-100) 147/100 mmHg (09/10 0515) SpO2:  [97 %-99 %] 97 % (09/10 0515) HEMODYNAMICS:   VENTILATOR SETTINGS:   INTAKE / OUTPUT:  Intake/Output Summary (Last 24 hours) at 01/24/14 0922 Last data filed at 01/24/14 0516  Gross per 24 hour  Intake    360 ml  Output    300 ml  Net     60 ml    PHYSICAL EXAMINATION:  BP 147/100  Pulse 106  Temp(Src) 98.3 F (36.8 C) (Oral)  Resp 18  Ht 5\' 10"  (1.778 m)  Wt 90.4 kg (199 lb 4.7 oz)  BMI 28.60 kg/m2  SpO2 97%  General Appearance:    Alert, cooperative, no distress, appears stated age  Head:     Normocephalic, without obvious abnormality, atraumatic  Eyes:    PERRL, conjunctiva/corneas clear, EOM's intact, fundi    benign, both eyes       Ears:    Normal TM's and external ear canals, both ears  Nose:   Nares normal, septum midline, mucosa normal, no drainage   or sinus tenderness  Throat:   Lips, mucosa, and tongue normal; teeth and gums normal  Neck:   Supple, symmetrical, trachea midline, no adenopathy;       thyroid:  No enlargement/tenderness/nodules; no carotid   bruit or JVD  Back:     Symmetric, no curvature, ROM normal, no CVA tenderness  Lungs:    RT base has stony dullness and diminished air entry  Chest wall:    No tenderness or deformity, Scars of right chest tube +  Heart:    Regular rate and rhythm, S1 and S2 normal, no murmur, rub   or gallop  Abdomen:     Soft,  non-tender, bowel sounds active all four quadrants,    no masses, no organomegaly  Genitalia:    Normal male without lesion, discharge or tenderness  Rectal:    Normal tone, normal prostate, no masses or tenderness;   guaiac negative stool  Extremities:   Extremities normal, atraumatic, no cyanosis or edema  Pulses:   2+ and symmetric all extremities  Skin:   Skin color, texture, turgor normal, no rashes or lesions  Lymph nodes:   Cervical, supraclavicular, and axillary nodes normal  Neurologic:   CNII-XII intact. Normal strength, sensation and reflexes      Throughout      LABS:  PULMONARY No results found for this basename: PHART, PCO2, PCO2ART, PO2, PO2ART, HCO3, TCO2, O2SAT,  in the last 168 hours  CBC  Recent Labs Lab 01/21/14 0830 01/22/14 0443 01/24/14 0510  HGB 10.7* 11.0* 10.9*  HCT 32.5* 32.9* 31.8*  WBC 10.7* 14.3* 11.8*  PLT 367 350 337    COAGULATION No results found for this basename: INR,  in the last 168 hours  CARDIAC  No results found for this basename: TROPONINI,  in the last 168 hours No results found for this basename: PROBNP,  in the last 168  hours   CHEMISTRY  Recent Labs Lab 01/19/14 0418 01/20/14 1300 01/21/14 0830 01/22/14 0443 01/24/14 0510  NA 140 141 142 138 131*  K 3.3* 3.5* 3.5* 3.4* 3.5*  CL 104 103 105 100 95*  CO2 22 25 25 24 24   GLUCOSE 99 103* 99 93 108*  BUN 13 7 5* 7 16  CREATININE 1.21 1.09 1.06 1.52* 2.13*  CALCIUM 9.2 9.3 9.3 9.8 10.0  MG  --  2.0  --   --  2.1   Estimated Creatinine Clearance: 51.6 ml/min (by C-G formula based on Cr of 2.13).   LIVER  Recent Labs Lab 01/18/14 1306  AST 25  ALT 23  ALKPHOS 100  BILITOT 0.7  PROT 8.8*  ALBUMIN 3.6     INFECTIOUS  Recent Labs Lab 01/18/14 1310  LATICACIDVEN 2.20     ENDOCRINE CBG (last 3)   Recent Labs  01/21/14 2253  GLUCAP 163*         IMAGING x48h Dg Chest 2 View  01/23/2014   CLINICAL DATA:  43 year old male with loculated right pleural collection status post pleural drain. Weakness. Initial encounter.  EXAM: CHEST  2 VIEW  COMPARISON:  01/18/2014 and earlier.  FINDINGS: Moderate volume residual right pleural effusion or collection, tracking laterally. No pneumothorax identified. Stable cardiac size and mediastinal contours. Visualized tracheal air column is within normal limits. Stable lung parenchyma. No worsening ventilation or consolidation. No acute osseous abnormality identified.  IMPRESSION: Moderate size residual right pleural effusion/collection. No new cardiopulmonary abnormality.   Electronically Signed   By: Lars Pinks M.D.   On: 01/23/2014 11:01      ASSESSMENT / PLAN:  PULMONARY  A: Right parapneumonic effusion - he first had pneumonia 12/20/13 but when discharged 12/25/13 doubt he took antibiotics as advised due to his substance abuse. Then when readmitted mid-aug 2015, he had right parapneumonic effusion that was Rx drainage with chest tubes x 2 (first 8/18 - 8/23 and s/p TPA and second 8/25-8/28) but he left AMA and dobut due to his substance abuse hx took antibiotics. Now this admit 01/18/2014 it  appears right sided effusion is worse again   P:   Get CT chest IF loculations, consider IR guided Chest tube with repeat tPA v  CVTS consult v leave alone      Dr. Brand Males, M.D., [facility].C.P Pulmonary and Critical Care Medicine Staff Physician [place] [facility] Pager: [phone], If no answer or between  15:00h - 7:00h: call [phone]  01/24/2014 9:43 AM

## 2014-01-24 NOTE — Progress Notes (Signed)
PT Cancellation Note  Patient Details Name: Ralph Chavez MRN: [id number] DOB: 1970/09/21   Cancelled Treatment:    Reason Eval/Treat Not Completed: Patient declined, no reason specified (pt refused to get OOB, attempted to encourage however pt became agitated and continued to refuse)   Cree Napoli,KATHrine E 01/24/2014, 12:00 PM Carmelia Bake, PT, DPT 01/24/2014 Pager: [phone]

## 2014-01-24 NOTE — Progress Notes (Signed)
Clinical Social Work  Patient was discussed during progression meeting and RN is concerned about patient's mobility. PT has been ordered but patient refused to work with PT. Psych MD does not feel that patient has capacity at this time. Patient was discussed during [place] LOS meeting and director is aware of possible letter of guarantee needs.CSW spoke with patient at bedside but patient confused and unable to participate in assessment.  RN reports that patient's friend Veterinary surgeon) has come by to visit but no contact information is listed for Ralph Chavez and patient does not know her phone number.  CSW called and spoke with patient's mom who lives in [place]. Mom reports that patient has been a chronic substance user and has ruined relationships within in his family. Patient was calling mom on a daily basis and mom reports when she does not get a phone call for a few days she assumes patient has been placed in prison or in the hospital. Mom reports it is not an option for patient to live with her and he does not have relationships with any other family members. Mom states that patient has been homeless off and on for the past several years and has had a difficult time finding a job because of his arrest record and that he is a registered sex offender. Mom reports that patient has been diagnosed with bipolar in the past and he was prescribed Gabapentin. Mom reports patient was doing well when he was compliant with medications and follow up appointments. Mom states that patient was living with a friend Ralph Chavez) but he was asked to leave there and she is does not personally know patient's girlfriend Ralph Chavez). Mom reports patient met girlfriend at rehab a few years ago. Mom is unable to help apply for Medicaid because she lives in [place] and reports none of patient's friends are supportive/willing to help. Mom agreeable to assist as she can and agreeable for CSW to continue to follow.  Once PT evaluates patient, CSW will  follow up and assist as needed.  [place], [place] [phone]

## 2014-01-24 NOTE — Progress Notes (Signed)
[facility] PROGRESS NOTE  Ralph Chavez BJY:[id number] DOB: Jun 24, 1970 DOA: 01/18/2014 PCP: No PCP Per Patient  Interim summary  43 year old male with a history of hypertension, pneumonia, tobacco abuse, alcohol abuse and polysubstance abuse who had 2 previous admissions to the hospital in August 2015. On his admission from 12/20/2013 through 12/25/2013 the patient had an elevated troponin and underwent cardiac catheterization on 12/24/2013 which showed clean coronaries. During that hospitalization, the patient also had aspiration pneumonitis and pulmonary edema. He was discharged with 5 days of Augmentin. The patient was re-admitted on 12/31/2013 until he left [place] on 01/11/2014. During that admission, the patient had acute respiratory failure secondary to pneumonia with loculated pleural effusion, and he required placement of a right-sided chest tube on 01/01/2014 he had his pigtail catheter removed on August 23. He also had a thoracocentesis 01/04/2014 due to residual loculated collection in the right lung that was not drained by the initial chest tube.. Because of persistent pleural fluid, another R-chest tube was placed by IR on 01/08/2014. He was treated with initially with vancomycin and cefepime. After initially 4 days of vancomycin, cefepime was continued through the hospitalization. All his cultures including blood culture and pleural fluid cultures were negative, and cytology on 01/04/2014 was negative for malignancy. His second chest tube was ultimately discontinued on 01/11/2014 and he was transitioned to oral levofloxacin. Pulmonary followup was set up with Dr. Halford Chessman for 02/01/14@noon . The patient represented to the emergency department on 01/18/2014 because of confusion. His urine drug screen showed only benzodiazepines, and his alcohol level was negative. The patient was brought to the emergency department by the [facility] and paramedics where he was quite  confused and found to have a questionable right lower lobe infiltrate with WBC 20.7 and tachycardia. At the beginning of the hospital admission, the patient was very paranoid and belligerent. The patient continues to be easily irritable, but has improved with Haldol. He has revealed that he has been using crystal meth and cocaine prior to the admission      Assessment/Plan: #1 sepsis Present at the time of admission secondary to healthcare associated pneumonia/empyema. Patient pulled out IV lines and after several attempts refused further placement of IV and has been subsequently transitioned to oral Levaquin.  #2 HCAP/empyema Patient left AMA on 01/11/2049 unclear as to whether patient was compliant with his antibiotics. Patient had received IV cefepime from 12/31/2013 2 01/11/2014 at prior admission. 01/22/2014 WBC is increased patient did not want IV to be placed but stated he will reconsider. Currently on oral Levaquin. Continue flutter valve, bronchial hygiene, bronchodilators. A white count decreasing, may need another chest tube placement by our secondary to possible reaccumulation of effusion per chest x-ray. Repeat CT chest. Pulmonary consult.  #3 bacteremia Likely contaminant. IV vancomycin has been discontinued.  #4 acute encephalopathy Likely secondary to sepsis and drug induced psychosis. Patient is less belligerent and less verbally abusive. Continue alcohol withdrawal protocol. Patient has been seen by psychiatry. Increase scheduled Haldol to 5mg  BID and as needed Haldol for agitation.  #5 acute renal insufficiency Renal function worse today. Check FENa, check UA, check Renal US. IVF. Follow.  #6 Impaired glucose tolerance Hgb A1C 5.8  #7 hypertension Stable continue Norvasc.  #8 hypokalemia Replete.    Code Status: Full Family Communication: Updated patient no family at bedside. Disposition Plan: Home vs SNF when medically  stable.   Consultants:  None  Procedures:  Chest x-ray 01/23/2014, 01/18/2014  Antibiotics: Vancomycin 01/18/14>>> 01/21/2014  Zosyn 01/18/14>>>01/21/14  Levofloxacin 01/21/14>>>    HPI/Subjective: Patient with no complaints. Alert to self and time.  Objective: Filed Vitals:   01/24/14 0515  BP: 147/100  Pulse: 106  Temp: 98.3 F (36.8 C)  Resp: 18    Intake/Output Summary (Last 24 hours) at 01/24/14 0750 Last data filed at 01/24/14 0516  Gross per 24 hour  Intake    360 ml  Output    600 ml  Net   -240 ml   Filed Weights   01/18/14 1728  Weight: 90.4 kg (199 lb 4.7 oz)    Exam:   General:  NAD  Cardiovascular: RRR  Respiratory: CTAB  Abdomen: Soft/NT/ND/+BS  Musculoskeletal: No c/c/e   Data Reviewed: Basic Metabolic Panel:  Recent Labs Lab 01/19/14 0418 01/20/14 1300 01/21/14 0830 01/22/14 0443 01/24/14 0510  NA 140 141 142 138 131*  K 3.3* 3.5* 3.5* 3.4* 3.5*  CL 104 103 105 100 95*  CO2 22 25 25 24 24   GLUCOSE 99 103* 99 93 108*  BUN 13 7 5* 7 16  CREATININE 1.21 1.09 1.06 1.52* 2.13*  CALCIUM 9.2 9.3 9.3 9.8 10.0  MG  --  2.0  --   --  2.1   Liver Function Tests:  Recent Labs Lab 01/18/14 1306  AST 25  ALT 23  ALKPHOS 100  BILITOT 0.7  PROT 8.8*  ALBUMIN 3.6   No results found for this basename: LIPASE, AMYLASE,  in the last 168 hours No results found for this basename: AMMONIA,  in the last 168 hours CBC:  Recent Labs Lab 01/18/14 1306 01/19/14 0418 01/20/14 1300 01/21/14 0830 01/22/14 0443 01/24/14 0510  WBC 20.7* 14.6* 11.0* 10.7* 14.3* 11.8*  NEUTROABS 18.3*  --   --   --   --  8.8*  HGB 13.4 10.9* 11.0* 10.7* 11.0* 10.9*  HCT 39.3 33.1* 33.0* 32.5* 32.9* 31.8*  MCV 88.3 88.3 88.2 88.6 88.9 85.7  PLT 610* 417* 444* 367 350 337   Cardiac Enzymes: No results found for this basename: CKTOTAL, CKMB, CKMBINDEX, TROPONINI,  in the last 168 hours BNP (last 3 results)  Recent Labs  12/24/13 0409  PROBNP  538.5*   CBG:  Recent Labs Lab 01/18/14 1243 01/21/14 2253  GLUCAP 192* 163*    Recent Results (from the past 240 hour(s))  CULTURE, BLOOD (ROUTINE X 2)     Status: None   Collection Time    01/18/14  1:04 PM      Result Value Ref Range Status   Specimen Description BLOOD RIGHT ANTECUBITAL   Final   Special Requests BOTTLES DRAWN AEROBIC AND ANAEROBIC 5ML   Final   Culture  Setup Time     Final   Value: 01/18/2014 20:45     Performed at [facility]   Culture     Final   Value:        BLOOD CULTURE RECEIVED NO GROWTH TO DATE CULTURE WILL BE HELD FOR 5 DAYS BEFORE ISSUING A FINAL NEGATIVE REPORT     Performed at [facility]   Report Status PENDING   Incomplete  CULTURE, BLOOD (ROUTINE X 2)     Status: None   Collection Time    01/18/14  1:07 PM      Result Value Ref Range Status   Specimen Description BLOOD RIGHT ARM   Final   Special Requests BOTTLES DRAWN AEROBIC ONLY 1ML   Final   Culture  Setup Time  Final   Value: 01/18/2014 20:45     Performed at [facility]   Culture     Final   Value: STAPHYLOCOCCUS SPECIES (COAGULASE NEGATIVE)     Note: CRITICAL RESULT CALLED TO, READ BACK BY AND VERIFIED WITH: Nelia Shi RN @1109PM  Thelma Comp [id number] THE SIGNIFICANCE OF ISOLATING THIS ORGANISM FROM A SINGLE SET OF BLOOD CULTURES WHEN MULTIPLE SETS ARE DRAWN IS UNCERTAIN. PLEASE NOTIFY THE MICROBIOLOGY      DEPARTMENT WITHIN ONE WEEK IF SPECIATION AND SENSITIVITIES ARE REQUIRED.     Performed at [facility]   Report Status 01/21/2014 FINAL   Final  URINE CULTURE     Status: None   Collection Time    01/18/14  3:43 PM      Result Value Ref Range Status   Specimen Description URINE, RANDOM   Final   Special Requests NONE   Final   Culture  Setup Time     Final   Value: 01/18/2014 20:05     Performed at [facility]     Final   Value: NO GROWTH     Performed at [facility]   Culture     Final   Value: NO GROWTH      Performed at [facility]   Report Status 01/19/2014 FINAL   Final     Studies: Dg Chest 2 View  01/23/2014   CLINICAL DATA:  43 year old male with loculated right pleural collection status post pleural drain. Weakness. Initial encounter.  EXAM: CHEST  2 VIEW  COMPARISON:  01/18/2014 and earlier.  FINDINGS: Moderate volume residual right pleural effusion or collection, tracking laterally. No pneumothorax identified. Stable cardiac size and mediastinal contours. Visualized tracheal air column is within normal limits. Stable lung parenchyma. No worsening ventilation or consolidation. No acute osseous abnormality identified.  IMPRESSION: Moderate size residual right pleural effusion/collection. No new cardiopulmonary abnormality.   Electronically Signed   By: Lars Pinks M.D.   On: 01/23/2014 11:01    Scheduled Meds: . amLODipine  5 mg Oral Daily  . enoxaparin (LOVENOX) injection  40 mg Subcutaneous Q24H  . folic acid  1 mg Oral Daily  . haloperidol  5 mg Oral BID  . levofloxacin  750 mg Oral Q24H  . multivitamin with minerals  1 tablet Oral Daily  . sodium chloride  1,000 mL Intravenous Once  . thiamine  100 mg Oral Daily   Or  . thiamine  100 mg Intravenous Daily   Continuous Infusions: . sodium chloride 0.9 % 1,000 mL with potassium chloride 20 mEq infusion 75 mL/hr at 01/20/14 2207    Principal Problem:   Sepsis Active Problems:   Polysubstance abuse   Tachycardia   Acute encephalopathy   Right lower lobe pneumonia   Hyperglycemia   Metabolic acidosis   AKI (acute kidney injury)    Time spent: Cook MD [facility] Pager [phone]. If 7PM-7AM, please contact night-coverage at [email or web address], password [facility] 01/24/2014, 7:50 AM  LOS: 6 days

## 2014-01-25 ENCOUNTER — Inpatient Hospital Stay (HOSPITAL_COMMUNITY): Payer: MEDICAID

## 2014-01-25 DIAGNOSIS — R404 Transient alteration of awareness: Secondary | ICD-10-CM

## 2014-01-25 LAB — BASIC METABOLIC PANEL
Anion gap: 13 (ref 5–15)
BUN: 14 mg/dL (ref 6–23)
CO2: 24 mEq/L (ref 19–32)
Calcium: 10.1 mg/dL (ref 8.4–10.5)
Chloride: 104 mEq/L (ref 96–112)
Creatinine, Ser: 2.01 mg/dL — ABNORMAL HIGH (ref 0.50–1.35)
GFR, EST AFRICAN AMERICAN: 46 mL/min — AB (ref >90)
GFR, EST NON AFRICAN AMERICAN: 39 mL/min — AB (ref >90)
Glucose, Bld: 100 mg/dL — ABNORMAL HIGH (ref 70–99)
Potassium: 4.2 mEq/L (ref 3.7–5.3)
Sodium: 141 mEq/L (ref 137–147)

## 2014-01-25 LAB — CBC
HCT: 32.7 % — ABNORMAL LOW (ref 39.0–52.0)
HEMOGLOBIN: 11.4 g/dL — AB (ref 13.0–17.0)
MCH: 30.4 pg (ref 26.0–34.0)
MCHC: 34.9 g/dL (ref 30.0–36.0)
MCV: 87.2 fL (ref 78.0–100.0)
PLATELETS: 329 10*3/uL (ref 150–400)
RBC: 3.75 MIL/uL — ABNORMAL LOW (ref 4.22–5.81)
RDW: 14.9 % (ref 11.5–15.5)
WBC: 10.4 10*3/uL (ref 4.0–10.5)

## 2014-01-25 LAB — URINE CULTURE
Colony Count: NO GROWTH
Culture: NO GROWTH

## 2014-01-25 MED ORDER — SODIUM CHLORIDE 0.9 % IV BOLUS (SEPSIS)
500.0000 mL | Freq: Once | INTRAVENOUS | Status: AC
  Administered 2014-01-25: 500 mL via INTRAVENOUS

## 2014-01-25 MED ORDER — SODIUM CHLORIDE 0.9 % IV SOLN
INTRAVENOUS | Status: DC
  Administered 2014-01-26: 20:00:00 via INTRAVENOUS
  Filled 2014-01-25 (×4): qty 1000

## 2014-01-25 MED ORDER — HALOPERIDOL 2 MG PO TABS
2.0000 mg | ORAL_TABLET | Freq: Two times a day (BID) | ORAL | Status: DC
  Administered 2014-01-25 – 2014-01-29 (×8): 2 mg via ORAL
  Filled 2014-01-25 (×11): qty 1

## 2014-01-25 MED ORDER — INFLUENZA VAC SPLIT QUAD 0.5 ML IM SUSY
0.5000 mL | PREFILLED_SYRINGE | INTRAMUSCULAR | Status: DC
  Filled 2014-01-25 (×5): qty 0.5

## 2014-01-25 NOTE — Progress Notes (Signed)
[facility] PROGRESS NOTE  Ralph Chavez ZOX:[id number] DOB: Apr 26, 1971 DOA: 01/18/2014 PCP: No PCP Per Patient  Interim summary  43 year old male with a history of hypertension, pneumonia, tobacco abuse, alcohol abuse and polysubstance abuse who had 2 previous admissions to the hospital in August 2015. On his admission from 12/20/2013 through 12/25/2013 the patient had an elevated troponin and underwent cardiac catheterization on 12/24/2013 which showed clean coronaries. During that hospitalization, the patient also had aspiration pneumonitis and pulmonary edema. He was discharged with 5 days of Augmentin. The patient was re-admitted on 12/31/2013 until he left [place] on 01/11/2014. During that admission, the patient had acute respiratory failure secondary to pneumonia with loculated pleural effusion, and he required placement of a right-sided chest tube on 01/01/2014 he had his pigtail catheter removed on August 23. He also had a thoracocentesis 01/04/2014 due to residual loculated collection in the right lung that was not drained by the initial chest tube.. Because of persistent pleural fluid, another R-chest tube was placed by IR on 01/08/2014. He was treated with initially with vancomycin and cefepime. After initially 4 days of vancomycin, cefepime was continued through the hospitalization. All his cultures including blood culture and pleural fluid cultures were negative, and cytology on 01/04/2014 was negative for malignancy. His second chest tube was ultimately discontinued on 01/11/2014 and he was transitioned to oral levofloxacin. Pulmonary followup was set up with Dr. Halford Chessman for 02/01/14@noon . The patient represented to the emergency department on 01/18/2014 because of confusion. His urine drug screen showed only benzodiazepines, and his alcohol level was negative. The patient was brought to the emergency department by the [facility] and paramedics where he was quite  confused and found to have a questionable right lower lobe infiltrate with WBC 20.7 and tachycardia. At the beginning of the hospital admission, the patient was very paranoid and belligerent. The patient continues to be easily irritable, but has improved with Haldol. He has revealed that he has been using crystal meth and cocaine prior to the admission      Assessment/Plan: #1 sepsis Present at the time of admission secondary to healthcare associated pneumonia/empyema. Patient pulled out IV lines and after several attempts refused further placement of IV and has been subsequently transitioned to oral Levaquin.  #2 HCAP/empyema Patient left AMA on 01/11/2049 unclear as to whether patient was compliant with his antibiotics. Patient had received IV cefepime from 12/31/2013 2 01/11/2014 at prior admission. 01/22/2014 WBC is increased patient did not want IV to be placed but stated he will reconsider. Currently on oral Levaquin. Continue flutter valve, bronchial hygiene, bronchodilators. A white count decreasing, may need another chest tube placement by our secondary to possible reaccumulation of effusion per chest x-ray. Repeat CT chest with improved effusion and only residual effusion present with small right lower lobe consolidation. Patient has been seen by pulmonary and given improvement on CT scan with psychiatric history recommend conservative treatment at this time in follow her expectantly with outpatient followup with pulmonary as previously scheduled on 02/01/2014.  #3 bacteremia Likely contaminant. IV vancomycin has been discontinued.  #4 acute encephalopathy Likely secondary to sepsis and drug induced psychosis. Patient is less belligerent and less verbally abusive. Continue alcohol withdrawal protocol. Patient has been seen by psychiatry. Patient noted to be a little more drowsy on increased dose of Haldol which may be affecting his ambulation. We'll decrease Haldol dose back to 2 mg by  mouth twice a day and continue as needed Haldol for agitation.  #5  acute renal insufficiency Renal function started to trend back down. Patient refused renal ultrasound. Continue hydration with IV fluids and follow.   #6 Impaired glucose tolerance Hgb A1C 5.8  #7 hypertension Stable continue Norvasc.  #8 hypokalemia Repleted.    Code Status: Full Family Communication: Updated patient no family at bedside. Disposition Plan: Home vs SNF when medically stable.   Consultants:  Pulmonary and critical care medicine: Dr. Chase Caller 01/24/2014  Procedures:  Chest x-ray 01/23/2014, 01/18/2014  CT chest 01/24/2014  Antibiotics: Vancomycin 01/18/14>>> 01/21/2014  Zosyn 01/18/14>>>01/21/14  Levofloxacin 01/21/14>>>    HPI/Subjective: Patient with no complaints. Alert to self and time.  Objective: Filed Vitals:   01/25/14 0656  BP: 161/92  Pulse: 89  Temp: 97.5 F (36.4 C)  Resp: 16    Intake/Output Summary (Last 24 hours) at 01/25/14 1122 Last data filed at 01/25/14 1100  Gross per 24 hour  Intake      0 ml  Output   5325 ml  Net  -5325 ml   Filed Weights   01/18/14 1728  Weight: 90.4 kg (199 lb 4.7 oz)    Exam:   General:  NAD  Cardiovascular: RRR  Respiratory: CTAB  Abdomen: Soft/NT/ND/+BS  Musculoskeletal: No c/c/e   Data Reviewed: Basic Metabolic Panel:  Recent Labs Lab 01/19/14 0418 01/20/14 1300 01/21/14 0830 01/22/14 0443 01/24/14 0510 01/25/14 0440  NA 140 141 142 138 131* 141  K 3.3* 3.5* 3.5* 3.4* 3.5* 4.2  CL 104 103 105 100 95* 104  CO2 22 25 25 24 24 24   GLUCOSE 99 103* 99 93 108* 100*  BUN 13 7 5* 7 16 14   CREATININE 1.21 1.09 1.06 1.52* 2.13* 2.01*  CALCIUM 9.2 9.3 9.3 9.8 10.0 10.1  MG  --  2.0  --   --  2.1  --    Liver Function Tests:  Recent Labs Lab 01/18/14 1306  AST 25  ALT 23  ALKPHOS 100  BILITOT 0.7  PROT 8.8*  ALBUMIN 3.6   No results found for this basename: LIPASE, AMYLASE,  in the last 168 hours No  results found for this basename: AMMONIA,  in the last 168 hours CBC:  Recent Labs Lab 01/18/14 1306  01/20/14 1300 01/21/14 0830 01/22/14 0443 01/24/14 0510 01/25/14 0440  WBC 20.7*  < > 11.0* 10.7* 14.3* 11.8* 10.4  NEUTROABS 18.3*  --   --   --   --  8.8*  --   HGB 13.4  < > 11.0* 10.7* 11.0* 10.9* 11.4*  HCT 39.3  < > 33.0* 32.5* 32.9* 31.8* 32.7*  MCV 88.3  < > 88.2 88.6 88.9 85.7 87.2  PLT 610*  < > 444* 367 350 337 329  < > = values in this interval not displayed. Cardiac Enzymes: No results found for this basename: CKTOTAL, CKMB, CKMBINDEX, TROPONINI,  in the last 168 hours BNP (last 3 results)  Recent Labs  12/24/13 0409  PROBNP 538.5*   CBG:  Recent Labs Lab 01/18/14 1243 01/21/14 2253  GLUCAP 192* 163*    Recent Results (from the past 240 hour(s))  CULTURE, BLOOD (ROUTINE X 2)     Status: None   Collection Time    01/18/14  1:04 PM      Result Value Ref Range Status   Specimen Description BLOOD RIGHT ANTECUBITAL   Final   Special Requests BOTTLES DRAWN AEROBIC AND ANAEROBIC 5ML   Final   Culture  Setup Time     Final  Value: 01/18/2014 20:45     Performed at [facility]   Culture     Final   Value: NO GROWTH 5 DAYS     Performed at [facility]   Report Status 01/24/2014 FINAL   Final  CULTURE, BLOOD (ROUTINE X 2)     Status: None   Collection Time    01/18/14  1:07 PM      Result Value Ref Range Status   Specimen Description BLOOD RIGHT ARM   Final   Special Requests BOTTLES DRAWN AEROBIC ONLY 1ML   Final   Culture  Setup Time     Final   Value: 01/18/2014 20:45     Performed at [facility]   Culture     Final   Value: STAPHYLOCOCCUS SPECIES (COAGULASE NEGATIVE)     Note: CRITICAL RESULT CALLED TO, READ BACK BY AND VERIFIED WITH: Nelia Shi RN @1109PM  VINCJ [id number] THE SIGNIFICANCE OF ISOLATING THIS ORGANISM FROM A SINGLE SET OF BLOOD CULTURES WHEN MULTIPLE SETS ARE DRAWN IS UNCERTAIN. PLEASE NOTIFY THE MICROBIOLOGY       DEPARTMENT WITHIN ONE WEEK IF SPECIATION AND SENSITIVITIES ARE REQUIRED.     Performed at [facility]   Report Status 01/21/2014 FINAL   Final  URINE CULTURE     Status: None   Collection Time    01/18/14  3:43 PM      Result Value Ref Range Status   Specimen Description URINE, RANDOM   Final   Special Requests NONE   Final   Culture  Setup Time     Final   Value: 01/18/2014 20:05     Performed at [place]     Final   Value: NO GROWTH     Performed at [facility]   Culture     Final   Value: NO GROWTH     Performed at [facility]   Report Status 01/19/2014 FINAL   Final     Studies: Ct Chest Wo Contrast  01/24/2014   CLINICAL DATA:  Loculated pleural effusion  EXAM: CT CHEST WITHOUT CONTRAST  TECHNIQUE: Multidetector CT imaging of the chest was performed following the standard protocol without IV contrast material administration.  COMPARISON:  Chest CT January 04, 2014 and chest radiograph January 23, 2014  FINDINGS: There is persistent right lower lobe consolidation with interspersed effusion in the right base. There is less consolidation effusion in the right base compared to recent CT examination. There is mild patchy infiltrate currently in the right middle lobe, less than on recent prior study.  On axial slice 41 series 5, there is a stable 2 mm nodular opacity in the inferior lingula. The left lung is otherwise clear except for some minimal atelectasis in the posterior left base.  There is no appreciable thoracic adenopathy. The pericardium is not thickened. There is no appreciable thoracic aortic aneurysm. There is no demonstrable pulmonary embolus on this noncontrast enhanced study.  Visualized upper abdominal structures appear unremarkable. There are no blastic or lytic bone lesions. Thyroid appears unremarkable.  IMPRESSION: Compared to recent prior CT, there is less effusion and consolidation in the right base. There does remain  consolidation in the right base with a fairly small amount of interspersed loculated fluid at the right base.  There is patchy infiltrate in the right middle lobe.  2 mm nodular opacity in the inferior lingula. Followup of this nodular opacity should be based on  Fleischner Society guidelines. If the patient is at high risk for bronchogenic carcinoma, follow-up chest CT at 1 year is recommended. If the patient is at low risk, no follow-up is needed. This recommendation follows the consensus statement: Guidelines for Management of Small Pulmonary Nodules Detected on CT Scans: A Statement from the [place] as published in Radiology 2005; 237:395-400.  No appreciable adenopathy.   Electronically Signed   By: Lowella Grip M.D.   On: 01/24/2014 10:12    Scheduled Meds: . amLODipine  5 mg Oral Daily  . cefpodoxime  200 mg Oral Q12H  . enoxaparin (LOVENOX) injection  40 mg Subcutaneous Q24H  . folic acid  1 mg Oral Daily  . haloperidol  2 mg Oral BID  . [START ON 01/26/2014] Influenza vac split quadrivalent PF  0.5 mL Intramuscular Tomorrow-1000  . multivitamin with minerals  1 tablet Oral Daily  . thiamine  100 mg Oral Daily   Or  . thiamine  100 mg Intravenous Daily   Continuous Infusions: . sodium chloride 0.9 % 1,000 mL with potassium chloride 20 mEq infusion 100 mL/hr at 01/25/14 0800    Principal Problem:   Sepsis Active Problems:   Polysubstance abuse   Tachycardia   Acute encephalopathy   Right lower lobe pneumonia   Hyperglycemia   Metabolic acidosis   AKI (acute kidney injury)    Time spent: Williamson MD [facility] Pager [phone]. If 7PM-7AM, please contact night-coverage at [email or web address], password [facility] 01/25/2014, 11:22 AM  LOS: 7 days

## 2014-01-25 NOTE — Progress Notes (Signed)
[facility]  [facility] is providing the following services: RW  If patient discharges after hours, please call [phone].   Linward Headland 01/25/2014, 11:16 AM

## 2014-01-25 NOTE — Plan of Care (Signed)
Problem: Phase I Progression Outcomes Goal: Initial discharge plan identified Outcome: Progressing cbn

## 2014-01-25 NOTE — Consult Note (Signed)
  Psychiatry Follow Up Consult. Duration 25 minutes. Asked to reevaluate patient ( please see prior psychiatric consultation ) to address capacity to make decisions regarding disposition planning Patient is a 43 year old man, admitted on 9/4 due to delirium, of uncertain origin. UDS  Was positive for BZDs and patient has a history of substance dependence( particularly cocaine/amphetamines) .  Alcohol level was negative Patient was found to have pneumonia, and was dehydrated. He presented with confusion, irritability, belligerence. He appeared psychotic and guarded. As per notes, he has improved partially thus far, but remains confused and irritable. At this time patient is alert, in bed, quite psycho-motorically restless, irritable,  At times starts yelling for no apparent reason, states his mood is " bad" but denies depression, affect is irritable, thought process is disorganized, he is denies suicidal or homicidal ideations, he denies hallucinations. He was at a loss to describe what is occuring, and stated he does not know why he is in the hospital, what his current condition is, and although he states he wants to go home, he cannot identify where his home is, how he would get there , or how he would continue to get treatment or function on a daily basis.  As discussed with Nursing Staff, has been irritable, restless, but there has been some improvement. Vitals 161/92, pulse 89, Temp. 97.5 No tremors noted. Assessment- Based on current presentation, patient has delirium of uncertain etiology, slowly improving, but still coursing with significant confusion, agitation. I would consider possibility of  Having used drugs not routinely identified in a UDS, such as Bath Salts or Spice, which can cause significant and sometimes protracted   Psychiatric symptomatology and agitation. Recommendations -I do not feel patient has capacity to make decisions regarding disposition plans at this time. Would continue to   attempt to identify family member or next of kin who can assist, or consider following  Hospital protocol to help procure a health care proxy.  -Agree with Haldol and/or Ativan PRNs for severe agitation, but  Would consider possibility of antipsychotic induced akathisia if psychomotor restlessness worsens after administration. -Would consider medication such as Depakote to address irritability, explosiveness.

## 2014-01-25 NOTE — Progress Notes (Signed)
PULMONARY / CRITICAL CARE MEDICINE   Name: Ralph Chavez MRN: [id number] DOB: 1970/10/07   LOS 7 days PCP No PCP Per Patient   ADMISSION DATE:  01/18/2014 CONSULTATION DATE:  01/24/2014   REFERRING MD :  Rick Duff sided loculated pleural effusion  CHIEF COMPLAINT:  Per referral complaint  BRIEF 43 year old male with a history of hypertension, pneumonia, tobacco abuse, alcohol abuse and polysubstance abuse who had 2 previous admissions to the hospital in August 2015. On his admission from 12/20/2013 through 12/25/2013 the patient had an elevated troponin and underwent cardiac catheterization on 12/24/2013 which showed clean coronaries. During that hospitalization, the patient also had aspiration pneumonitis and pulmonary edema. He was discharged with 5 days of Augmentin.   The patient was re-admitted on 12/31/2013.  During this admission, the patient had acute respiratory failure secondary to pneumonia with loculated pleural effusion, and he required placement of a right-sided chest tube on 01/01/2014 he had his pigtail catheter removed on August 23. He also had a thoracocentesis 01/04/2014 due to residual loculated collection in the right lung that was not drained by the initial chest tube. CVTS consult 01/05/14 recommended - conservative mgmt with abx and  Drainage Rx. Because of persistent pleural fluid, another R-chest tube was placed by IR on 01/08/2014. He was treated with initially with vancomycin and cefepime. After initially 4 days of vancomycin, cefepime was continued through the hospitalization. All his cultures including blood culture and pleural fluid cultures were negative, and cytology on 01/04/2014 was negative for malignancy. His second chest tube was ultimately discontinued on 01/11/2014 and he was transitioned to oral levofloxacin. Pulmonary followup was set up with Dr. Halford Chessman for 02/01/14@noon  but he left [place] on 01/11/2014..   Now the patient represented to the  emergency department on 01/18/2014 because of confusion. His urine drug screen showed only benzodiazepines, and his alcohol level was negative. The patient was brought to the emergency department by the [facility] and paramedics where he was quite confused and found to have a questionable right lower lobe infiltrate with WBC 20.7 and tachycardia. At the beginning of the hospital admission, the patient was very paranoid and belligerent. The patient continues to be easily irritable, but has improved with Haldol. He has revealed that he has been using crystal meth and cocaine prior to the admission  During this admission it was noted on CXR that the right pleural effusion might be worse and PCCM consulted 01/24/14, He gives no history other than being difficult  SIGNIFICANT EVENTS: 8/06 admitted for inability to walk chest pain, rhabdomyolysis   8/06 MRI thoracic spine > no acute findings, tiny T8-9 disc protrusion w/o impingement, R lung airspace disease   8/06 MRI Thoracic spine > no acute significant findings in thoracic spine, tiny t8-t9 disc protusion, RLL pneumonia   8/10 LHC > no CAD  8/11 discharged home on augmentin   12/31/13 - Readmitted  8/18 Placement of R CT :   CT chest > large R pleural effusion with compressive atelectasis of R lung, some left shift noted   8/19 50mg  TPA intrapleural  8/20 Improved CXR post placement of TPA   8/21 CT chest > rt effusions, loculations +, RLL consolidation  8/21 R thora > 750cc removed  01/05/14 CVTS Consult - Dr Cyndia Bent - no role for surgery. Rec med mgmt and [place] gided thora 8/23 R Chest tube removed   8/24 MRI Lumbar > tiny annular fissure at L5-S1 on LEFT 8/25 IR placed Rt chest tube, -  20 cm suction  8/26 Change chest tube to -10 cm suction  8/27 Change chest tube to water seal  8/28 D/c Rt chest tube -> LEFT AMA, had fu with Dr Halford Chessman for 02/01/14 at [place]  01/18/2014 - readmit  01/24/14 - PCCM cosnult for  effusion     SUBJECTIVE/OVERNIGHT/INTERVAL HX 9//11/15:  Denies specific complaints. Difficult historian. CT shows near total improvement in Rt sided effusion   VITAL SIGNS: Temp:  [97.5 F (36.4 C)-98.3 F (36.8 C)] 97.5 F (36.4 C) (09/11 0656) Pulse Rate:  [89-101] 89 (09/11 0656) Resp:  [16-18] 16 (09/11 0656) BP: (116-161)/(76-100) 161/92 mmHg (09/11 0656) SpO2:  [98 %-99 %] 98 % (09/11 0656) HEMODYNAMICS:   VENTILATOR SETTINGS:   INTAKE / OUTPUT:  Intake/Output Summary (Last 24 hours) at 01/25/14 1017 Last data filed at 01/25/14 0700  Gross per 24 hour  Intake      0 ml  Output   4625 ml  Net  -4625 ml    PHYSICAL EXAMINATION:  BP 161/92  Pulse 89  Temp(Src) 97.5 F (36.4 C) (Oral)  Resp 16  Ht 5\' 10"  (1.778 m)  Wt 90.4 kg (199 lb 4.7 oz)  BMI 28.60 kg/m2  SpO2 98%  General Appearance:    Alert, cooperative, no distress, appears stated age  Head:    Normocephalic, without obvious abnormality, atraumatic  Eyes:    PERRL, conjunctiva/corneas clear, EOM's intact, fundi    benign, both eyes       Ears:    Normal TM's and external ear canals, both ears  Nose:   Nares normal, septum midline, mucosa normal, no drainage   or sinus tenderness  Throat:   Lips, mucosa, and tongue normal; teeth and gums normal  Neck:   Supple, symmetrical, trachea midline, no adenopathy;       thyroid:  No enlargement/tenderness/nodules; no carotid   bruit or JVD  Back:     Symmetric, no curvature, ROM normal, no CVA tenderness  Lungs:    RT base has stony dullness and diminished air entry  Chest wall:    No tenderness or deformity, Scars of right chest tube +  Heart:    Regular rate and rhythm, S1 and S2 normal, no murmur, rub   or gallop  Abdomen:     Soft, non-tender, bowel sounds active all four quadrants,    no masses, no organomegaly  Genitalia:    Normal male without lesion, discharge or tenderness  Rectal:    Normal tone, normal prostate, no masses or tenderness;    guaiac negative stool  Extremities:   Extremities normal, atraumatic, no cyanosis or edema  Pulses:   2+ and symmetric all extremities  Skin:   Skin color, texture, turgor normal, no rashes or lesions  Lymph nodes:   Cervical, supraclavicular, and axillary nodes normal  Neurologic:   CNII-XII intact. Normal strength, sensation and reflexes      Throughout      LABS:  PULMONARY No results found for this basename: PHART, PCO2, PCO2ART, PO2, PO2ART, HCO3, TCO2, O2SAT,  in the last 168 hours  CBC  Recent Labs Lab 01/22/14 0443 01/24/14 0510 01/25/14 0440  HGB 11.0* 10.9* 11.4*  HCT 32.9* 31.8* 32.7*  WBC 14.3* 11.8* 10.4  PLT 350 337 329    COAGULATION No results found for this basename: INR,  in the last 168 hours  CARDIAC  No results found for this basename: TROPONINI,  in the last 168 hours No results found for  this basename: PROBNP,  in the last 168 hours   CHEMISTRY  Recent Labs Lab 01/19/14 0418 01/20/14 1300 01/21/14 0830 01/22/14 0443 01/24/14 0510 01/25/14 0440  NA 140 141 142 138 131* 141  K 3.3* 3.5* 3.5* 3.4* 3.5* 4.2  CL 104 103 105 100 95* 104  CO2 22 25 25 24 24 24   GLUCOSE 99 103* 99 93 108* 100*  BUN 13 7 5* 7 16 14   CREATININE 1.21 1.09 1.06 1.52* 2.13* 2.01*  CALCIUM 9.2 9.3 9.3 9.8 10.0 10.1  MG  --  2.0  --   --  2.1  --    Estimated Creatinine Clearance: 54.7 ml/min (by C-G formula based on Cr of 2.01).   LIVER  Recent Labs Lab 01/18/14 1306  AST 25  ALT 23  ALKPHOS 100  BILITOT 0.7  PROT 8.8*  ALBUMIN 3.6     INFECTIOUS  Recent Labs Lab 01/18/14 1310  LATICACIDVEN 2.20     ENDOCRINE CBG (last 3)  No results found for this basename: GLUCAP,  in the last 72 hours       IMAGING x48h Ct Chest Wo Contrast  01/24/2014   CLINICAL DATA:  Loculated pleural effusion  EXAM: CT CHEST WITHOUT CONTRAST  TECHNIQUE: Multidetector CT imaging of the chest was performed following the standard protocol without IV contrast  material administration.  COMPARISON:  Chest CT January 04, 2014 and chest radiograph January 23, 2014  FINDINGS: There is persistent right lower lobe consolidation with interspersed effusion in the right base. There is less consolidation effusion in the right base compared to recent CT examination. There is mild patchy infiltrate currently in the right middle lobe, less than on recent prior study.  On axial slice 41 series 5, there is a stable 2 mm nodular opacity in the inferior lingula. The left lung is otherwise clear except for some minimal atelectasis in the posterior left base.  There is no appreciable thoracic adenopathy. The pericardium is not thickened. There is no appreciable thoracic aortic aneurysm. There is no demonstrable pulmonary embolus on this noncontrast enhanced study.  Visualized upper abdominal structures appear unremarkable. There are no blastic or lytic bone lesions. Thyroid appears unremarkable.  IMPRESSION: Compared to recent prior CT, there is less effusion and consolidation in the right base. There does remain consolidation in the right base with a fairly small amount of interspersed loculated fluid at the right base.  There is patchy infiltrate in the right middle lobe.  2 mm nodular opacity in the inferior lingula. Followup of this nodular opacity should be based on Fleischner Society guidelines. If the patient is at high risk for bronchogenic carcinoma, follow-up chest CT at 1 year is recommended. If the patient is at low risk, no follow-up is needed. This recommendation follows the consensus statement: Guidelines for Management of Small Pulmonary Nodules Detected on CT Scans: A Statement from the [employer] as published in Radiology 2005; 237:395-400.  No appreciable adenopathy.   Electronically Signed   By: Lowella Grip M.D.   On: 01/24/2014 10:12      ASSESSMENT / PLAN:  PULMONARY  A: Right parapneumonic effusion - he first had pneumonia 12/20/13 but when  discharged 12/25/13 doubt he took antibiotics as advised due to his substance abuse. Then when readmitted mid-aug 2015, he had right parapneumonic effusion that was Rx drainage with chest tubes x 2 (first 8/18 - 8/23 and s/p TPA and second 8/25-8/28) but he left AMA and dobut due to his  substance abuse hx took antibiotics. Now this admit 01/18/2014 it appears right sided effusion is worse again on CXR  However, on CT Chest 01/24/14 - Effusion significantly better and only residual effusion present with small RLL consolidation  This is good prognosis and he should spontaneously heal 100% or just leave behind a small rind. Given this and his psych hx best left alone and followed expectantly   P:   FU with Dr Halford Chessman 02/01/14 for nodule and above  PCCM will sign off    Dr. Brand Males, M.D., [facility].C.P Pulmonary and Critical Care Medicine Staff Physician [facility] Pager: [phone], If no answer or between  15:00h - 7:00h: call [phone]  01/25/2014 10:17 AM
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
## 2014-01-25 NOTE — Evaluation (Signed)
Physical Therapy Evaluation Patient Details Name: Ralph Chavez MRN: [id number] DOB: 04/10/1971 Today's Date: 01/25/2014   History of Present Illness  [name] brought in by police on 9/4 with AMS.Past medical history of alcohol and polysubstance abuse who was  recently in [facility]  for pneumonia complicated by a parapneumonic efusion requiring a chest tube for a period of time. The patient had left AGAINST MEDICAL ADVICE during the hospitalization on 01/11/14   Clinical Impression  Patient  Very flat affect and  Sluggish today. He did participate with PT evaluation. Patient is well known to this writer from previous admission. Patient continues to have  Nonfunctioning Left quadriceps vs patella tendon , which was present last admit. Patient is able to ambulate if knee is "locked in" during stance but will have a risk for fall if knee start to flex while in standing/walking. Pt will benefit from PT to address problems listed in note.    Follow Up Recommendations Supervision/Assistance - 24 hour;SNF (unsure, ? ALF)    Equipment Recommendations  Rolling walker with 5" wheels    Recommendations for Other Services       Precautions / Restrictions Precautions Precautions: Fall Precaution Comments: can be beigerent.      Mobility  Bed Mobility Overal bed mobility: Needs Assistance Bed Mobility: Supine to Sit     Supine to sit: Supervision     General bed mobility comments: MUCH increased time to get self to sitting at the edge of the bed.  Transfers Overall transfer level: Needs assistance Equipment used: Rolling walker (2 wheeled) Transfers: Sit to/from Stand Sit to Stand: Min assist;From elevated surface         General transfer comment: extra time to finally perform activity, Zombie-like, decreased initiation.  Ambulation/Gait Ambulation/Gait assistance: Min assist Ambulation Distance (Feet): 180 Feet Assistive device: Rolling walker (2 wheeled) Gait Pattern/deviations:  Step-through pattern Gait velocity: decreased   General Gait Details: Noted L knee  hyperextends during stance.  attempted 2 steps without RW but too unsteady. Pt agreeable to use RW. Required tactile/verbal cues to  resume walking after stopping at the window, pt then  reached for rail, cues for safety to keep holding to RW.  Stairs            Wheelchair Mobility    Modified Rankin (Stroke Patients Only)       Balance           Standing balance support: Bilateral upper extremity supported;During functional activity Standing balance-Leahy Scale: Fair Standing balance comment: Can stand with UE support but did not fully test trying  without RW.                             Pertinent Vitals/Pain Pain Assessment: No/denies pain    Home Living Family/patient expects to be discharged to:: Unsure                      Prior Function                 Hand Dominance        Extremity/Trunk Assessment   Upper Extremity Assessment: Overall WFL for tasks assessed           Lower Extremity Assessment: LLE deficits/detail   LLE Deficits / Details: 0/5  quad/ knee extension which was the same last admission. ,UNABLE TO PALPATE PATEELAR TENDAN!!!! WHICH  MOST LIKELY IS CONTRIBUTING TO FREQUENT FALLS.  hip  flexion 3/5, dorsiflexion  is 3, noted slight  Foot drop of  medial foot during swing,     Communication   Communication:  (minimal  conversation, answering  curtly but not aggressive)  Cognition Arousal/Alertness: Awake/alert Behavior During Therapy: Flat affect Overall Cognitive Status: Impaired/Different from baseline Area of Impairment: Following commands;Safety/judgement;Awareness;Problem solving             Problem Solving: Slow processing;Decreased initiation;Requires tactile cues;Requires verbal cues;Difficulty sequencing General Comments: patient is very flat and  tardive in movements, degreased initiation, required freququent  cues to turn around, hold onto RW and then  would not sit down after returning to his room, stood and played with  IV cord, trying to place plug into IV pole. RN/CNA in to  assist getting patient to sit down. Patient is much different today than yesterday and this Probation officer is familiar with patient from Last admission.    General Comments      Exercises        Assessment/Plan    PT Assessment Patient needs continued PT services  PT Diagnosis Difficulty walking;Generalized weakness;Altered mental status   PT Problem List Decreased strength;Decreased activity tolerance;Decreased balance;Decreased mobility;Decreased safety awareness;Decreased knowledge of precautions;Decreased knowledge of use of DME;Decreased cognition  PT Treatment Interventions DME instruction;Gait training;Functional mobility training;Therapeutic activities;Therapeutic exercise;Patient/family education   PT Goals (Current goals can be found in the Care Plan section) Acute Rehab PT Goals Patient Stated Goal: not stated, very flat affect today. PT Goal Formulation: Patient unable to participate in goal setting Time For Goal Achievement: 02/08/14 Potential to Achieve Goals: Good    Frequency Min 2X/week   Barriers to discharge Decreased caregiver support      Co-evaluation               End of Session Equipment Utilized During Treatment: Gait belt Activity Tolerance: Patient limited by lethargy Patient left: in chair;with call bell/phone within reach;with chair alarm set;with nursing/sitter in room Nurse Communication: Mobility status (IV not connected and leaking in bed.ALSO alerted  CNA that L  knee high risk for buckling.)         Time: 4163-8453 PT Time Calculation (min): 24 min   Charges:   PT Evaluation $Initial PT Evaluation Tier I: 1 Procedure PT Treatments $Gait Training: 23-37 mins   PT G Codes:          Claretha Cooper 01/25/2014, 9:20 AM Tresa Endo PT [phone]
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
## 2014-01-25 NOTE — Progress Notes (Signed)
Patient has calmed down, eating supper.

## 2014-01-25 NOTE — Progress Notes (Signed)
Patient refused Renal US.

## 2014-01-26 LAB — BASIC METABOLIC PANEL
ANION GAP: 14 (ref 5–15)
BUN: 14 mg/dL (ref 6–23)
CHLORIDE: 100 meq/L (ref 96–112)
CO2: 24 mEq/L (ref 19–32)
CREATININE: 1.96 mg/dL — AB (ref 0.50–1.35)
Calcium: 10 mg/dL (ref 8.4–10.5)
GFR, EST AFRICAN AMERICAN: 47 mL/min — AB (ref >90)
GFR, EST NON AFRICAN AMERICAN: 41 mL/min — AB (ref >90)
Glucose, Bld: 96 mg/dL (ref 70–99)
Potassium: 4 mEq/L (ref 3.7–5.3)
Sodium: 138 mEq/L (ref 137–147)

## 2014-01-26 LAB — CBC
HCT: 35.6 % — ABNORMAL LOW (ref 39.0–52.0)
Hemoglobin: 11.7 g/dL — ABNORMAL LOW (ref 13.0–17.0)
MCH: 28.8 pg (ref 26.0–34.0)
MCHC: 32.9 g/dL (ref 30.0–36.0)
MCV: 87.7 fL (ref 78.0–100.0)
PLATELETS: 324 10*3/uL (ref 150–400)
RBC: 4.06 MIL/uL — ABNORMAL LOW (ref 4.22–5.81)
RDW: 14.5 % (ref 11.5–15.5)
WBC: 11.5 10*3/uL — AB (ref 4.0–10.5)

## 2014-01-26 NOTE — Progress Notes (Signed)
[facility] PROGRESS NOTE  Ralph Chavez XBJ:[id number] DOB: [birth year] DOA: 01/18/2014 PCP: No PCP Per Patient  Interim summary  43 year old male with a history of hypertension, pneumonia, tobacco abuse, alcohol abuse and polysubstance abuse who had 2 previous admissions to the hospital in August 2015. On his admission from 12/20/2013 through 12/25/2013 the patient had an elevated troponin and underwent cardiac catheterization on 12/24/2013 which showed clean coronaries. During that hospitalization, the patient also had aspiration pneumonitis and pulmonary edema. He was discharged with 5 days of Augmentin. The patient was re-admitted on 12/31/2013 until he left [place] on 01/11/2014. During that admission, the patient had acute respiratory failure secondary to pneumonia with loculated pleural effusion, and he required placement of a right-sided chest tube on 01/01/2014 he had his pigtail catheter removed on August 23. He also had a thoracocentesis 01/04/2014 due to residual loculated collection in the right lung that was not drained by the initial chest tube.. Because of persistent pleural fluid, another R-chest tube was placed by IR on 01/08/2014. He was treated with initially with vancomycin and cefepime. After initially 4 days of vancomycin, cefepime was continued through the hospitalization. All his cultures including blood culture and pleural fluid cultures were negative, and cytology on 01/04/2014 was negative for malignancy. His second chest tube was ultimately discontinued on 01/11/2014 and he was transitioned to oral levofloxacin. Pulmonary followup was set up with Dr. Halford Chessman for 02/01/14@noon . The patient represented to the emergency department on 01/18/2014 because of confusion. His urine drug screen showed only benzodiazepines, and his alcohol level was negative. The patient was brought to the emergency department by the [facility] and paramedics where he was quite  confused and found to have a questionable right lower lobe infiltrate with WBC 20.7 and tachycardia. At the beginning of the hospital admission, the patient was very paranoid and belligerent. The patient continues to be easily irritable, but has improved with Haldol. He has revealed that he has been using crystal meth and cocaine prior to the admission      Assessment/Plan: #1 sepsis Present at the time of admission secondary to healthcare associated pneumonia/empyema. Patient pulled out IV lines and after several attempts refused further placement of IV and has been subsequently transitioned to oral Levaquin.  #2 HCAP/empyema Patient left AMA on 01/11/2049 unclear as to whether patient was compliant with his antibiotics. Patient had received IV cefepime from 12/31/2013 2 01/11/2014 at prior admission. 01/22/2014 WBC is increased patient did not want IV to be placed but stated he will reconsider. Currently on oral Levaquin. Continue flutter valve, bronchial hygiene, bronchodilators. A white count decreasing, may need another chest tube placement by our secondary to possible reaccumulation of effusion per chest x-ray. Repeat CT chest with improved effusion and only residual effusion present with small right lower lobe consolidation. Patient has been seen by pulmonary and given improvement on CT scan with psychiatric history recommend conservative treatment at this time in follow her expectantly with outpatient followup with pulmonary as previously scheduled on 02/01/2014.  #3 bacteremia Likely contaminant. IV vancomycin has been discontinued.  #4 acute encephalopathy Likely secondary to sepsis and drug induced psychosis. Patient is less belligerent and less verbally abusive. Continue alcohol withdrawal protocol. Patient has been seen by psychiatry. Patient noted to be a little more drowsy on increased dose of Haldol which may be affecting his ambulation. Continue Haldol 2 mg by mouth twice a day and  continue as needed Haldol for agitation. Patient has been reassessed per psychiatry  the patient still lacks capacity.  #5 acute renal insufficiency Renal function started to trend back down. Patient refused renal ultrasound. Continue hydration with IV fluids and follow.   #6 Impaired glucose tolerance Hgb A1C 5.8  #7 hypertension Stable continue Norvasc.  #8 hypokalemia Repleted.    Code Status: Full Family Communication: Updated patient no family at bedside. Disposition Plan: Home vs SNF when medically stable.   Consultants:  Pulmonary and critical care medicine: Dr. Chase Caller 01/24/2014  Psychiatry: Dr. Melissa Montane 01/23/2014/Dr. Cobos 01/25/2014  Procedures:  Chest x-ray 01/23/2014, 01/18/2014  CT chest 01/24/2014  Antibiotics: Vancomycin 01/18/14>>> 01/21/2014  Zosyn 01/18/14>>>01/21/14  Levofloxacin 01/21/14>>>    HPI/Subjective: Patient sleeping.  Objective: Filed Vitals:   01/26/14 1015  BP: 143/93  Pulse: 91  Temp: 98.6 F (37 C)  Resp: 20    Intake/Output Summary (Last 24 hours) at 01/26/14 1142 Last data filed at 01/26/14 1016  Gross per 24 hour  Intake    500 ml  Output    175 ml  Net    325 ml   Filed Weights   01/18/14 1728  Weight: 90.4 kg (199 lb 4.7 oz)    Exam:   General:  NAD  Cardiovascular: RRR  Respiratory: CTAB  Abdomen: Soft/NT/ND/+BS  Musculoskeletal: No c/c/e   Data Reviewed: Basic Metabolic Panel:  Recent Labs Lab 01/20/14 1300 01/21/14 0830 01/22/14 0443 01/24/14 0510 01/25/14 0440 01/26/14 0526  NA 141 142 138 131* 141 138  K 3.5* 3.5* 3.4* 3.5* 4.2 4.0  CL 103 105 100 95* 104 100  CO2 25 25 24 24 24 24   GLUCOSE 103* 99 93 108* 100* 96  BUN 7 5* 7 16 14 14   CREATININE 1.09 1.06 1.52* 2.13* 2.01* 1.96*  CALCIUM 9.3 9.3 9.8 10.0 10.1 10.0  MG 2.0  --   --  2.1  --   --    Liver Function Tests: No results found for this basename: AST, ALT, ALKPHOS, BILITOT, PROT, ALBUMIN,  in the last 168 hours No  results found for this basename: LIPASE, AMYLASE,  in the last 168 hours No results found for this basename: AMMONIA,  in the last 168 hours CBC:  Recent Labs Lab 01/21/14 0830 01/22/14 0443 01/24/14 0510 01/25/14 0440 01/26/14 0526  WBC 10.7* 14.3* 11.8* 10.4 11.5*  NEUTROABS  --   --  8.8*  --   --   HGB 10.7* 11.0* 10.9* 11.4* 11.7*  HCT 32.5* 32.9* 31.8* 32.7* 35.6*  MCV 88.6 88.9 85.7 87.2 87.7  PLT 367 350 337 329 324   Cardiac Enzymes: No results found for this basename: CKTOTAL, CKMB, CKMBINDEX, TROPONINI,  in the last 168 hours BNP (last 3 results)  Recent Labs  12/24/13 0409  PROBNP 538.5*   CBG:  Recent Labs Lab 01/21/14 2253  GLUCAP 163*    Recent Results (from the past 240 hour(s))  CULTURE, BLOOD (ROUTINE X 2)     Status: None   Collection Time    01/18/14  1:04 PM      Result Value Ref Range Status   Specimen Description BLOOD RIGHT ANTECUBITAL   Final   Special Requests BOTTLES DRAWN AEROBIC AND ANAEROBIC 5ML   Final   Culture  Setup Time     Final   Value: 01/18/2014 20:45     Performed at [facility]   Culture     Final   Value: NO GROWTH 5 DAYS     Performed at [facility]  Report Status 01/24/2014 FINAL   Final  CULTURE, BLOOD (ROUTINE X 2)     Status: None   Collection Time    01/18/14  1:07 PM      Result Value Ref Range Status   Specimen Description BLOOD RIGHT ARM   Final   Special Requests BOTTLES DRAWN AEROBIC ONLY 1ML   Final   Culture  Setup Time     Final   Value: 01/18/2014 20:45     Performed at [facility]   Culture     Final   Value: STAPHYLOCOCCUS SPECIES (COAGULASE NEGATIVE)     Note: CRITICAL RESULT CALLED TO, READ BACK BY AND VERIFIED WITH: EVELYN M RN @1109PM  VINCJ [id number] THE SIGNIFICANCE OF ISOLATING THIS ORGANISM FROM A SINGLE SET OF BLOOD CULTURES WHEN MULTIPLE SETS ARE DRAWN IS UNCERTAIN. PLEASE NOTIFY THE MICROBIOLOGY      DEPARTMENT WITHIN ONE WEEK IF SPECIATION AND SENSITIVITIES ARE  REQUIRED.     Performed at [facility]   Report Status 01/21/2014 FINAL   Final  URINE CULTURE     Status: None   Collection Time    01/18/14  3:43 PM      Result Value Ref Range Status   Specimen Description URINE, RANDOM   Final   Special Requests NONE   Final   Culture  Setup Time     Final   Value: 01/18/2014 20:05     Performed at [place]     Final   Value: NO GROWTH     Performed at [facility]   Culture     Final   Value: NO GROWTH     Performed at [facility]   Report Status 01/19/2014 FINAL   Final  URINE CULTURE     Status: None   Collection Time    01/24/14  8:10 AM      Result Value Ref Range Status   Specimen Description URINE, RANDOM   Final   Special Requests NONE   Final   Culture  Setup Time     Final   Value: 01/24/2014 12:31     Performed at [place]     Final   Value: NO GROWTH     Performed at [facility]   Culture     Final   Value: NO GROWTH     Performed at [facility]   Report Status 01/25/2014 FINAL   Final     Studies: No results found.  Scheduled Meds: . amLODipine  5 mg Oral Daily  . cefpodoxime  200 mg Oral Q12H  . enoxaparin (LOVENOX) injection  40 mg Subcutaneous Q24H  . folic acid  1 mg Oral Daily  . haloperidol  2 mg Oral BID  . Influenza vac split quadrivalent PF  0.5 mL Intramuscular Tomorrow-1000  . multivitamin with minerals  1 tablet Oral Daily  . thiamine  100 mg Oral Daily   Or  . thiamine  100 mg Intravenous Daily   Continuous Infusions: . sodium chloride 0.9 % 1,000 mL infusion      Principal Problem:   Sepsis Active Problems:   Polysubstance abuse   Tachycardia   Acute encephalopathy   Right lower lobe pneumonia   Hyperglycemia   Metabolic acidosis   AKI (acute kidney injury)    Time spent: [place] MD [facility] Pager [phone]. If 7PM-7AM, please  contact night-coverage at  [email or web address], password [facility] 01/26/2014, 11:42 AM  LOS: 8 days

## 2014-01-27 LAB — BASIC METABOLIC PANEL
ANION GAP: 14 (ref 5–15)
BUN: 12 mg/dL (ref 6–23)
CALCIUM: 9.9 mg/dL (ref 8.4–10.5)
CHLORIDE: 103 meq/L (ref 96–112)
CO2: 23 mEq/L (ref 19–32)
CREATININE: 1.6 mg/dL — AB (ref 0.50–1.35)
GFR calc non Af Amer: 52 mL/min — ABNORMAL LOW (ref >90)
GFR, EST AFRICAN AMERICAN: 60 mL/min — AB (ref >90)
Glucose, Bld: 103 mg/dL — ABNORMAL HIGH (ref 70–99)
Potassium: 3.6 mEq/L — ABNORMAL LOW (ref 3.7–5.3)
Sodium: 140 mEq/L (ref 137–147)

## 2014-01-27 MED ORDER — SODIUM CHLORIDE 0.9 % IV SOLN
INTRAVENOUS | Status: DC
  Administered 2014-01-27: 23:00:00 via INTRAVENOUS
  Filled 2014-01-27 (×5): qty 1000

## 2014-01-27 MED ORDER — POTASSIUM CHLORIDE CRYS ER 20 MEQ PO TBCR
40.0000 meq | EXTENDED_RELEASE_TABLET | Freq: Once | ORAL | Status: AC
  Administered 2014-01-27: 40 meq via ORAL
  Filled 2014-01-27: qty 2

## 2014-01-27 NOTE — Progress Notes (Signed)
[facility] PROGRESS NOTE  Ralph Chavez VQM:[id number] DOB: November 23, 1970 DOA: 01/18/2014 PCP: No PCP Per Patient  Interim summary  43 year old male with a history of hypertension, pneumonia, tobacco abuse, alcohol abuse and polysubstance abuse who had 2 previous admissions to the hospital in August 2015. On his admission from 12/20/2013 through 12/25/2013 the patient had an elevated troponin and underwent cardiac catheterization on 12/24/2013 which showed clean coronaries. During that hospitalization, the patient also had aspiration pneumonitis and pulmonary edema. He was discharged with 5 days of Augmentin. The patient was re-admitted on 12/31/2013 until he left [place] on 01/11/2014. During that admission, the patient had acute respiratory failure secondary to pneumonia with loculated pleural effusion, and he required placement of a right-sided chest tube on 01/01/2014 he had his pigtail catheter removed on August 23. He also had a thoracocentesis 01/04/2014 due to residual loculated collection in the right lung that was not drained by the initial chest tube.. Because of persistent pleural fluid, another R-chest tube was placed by IR on 01/08/2014. He was treated with initially with vancomycin and cefepime. After initially 4 days of vancomycin, cefepime was continued through the hospitalization. All his cultures including blood culture and pleural fluid cultures were negative, and cytology on 01/04/2014 was negative for malignancy. His second chest tube was ultimately discontinued on 01/11/2014 and he was transitioned to oral levofloxacin. Pulmonary followup was set up with Dr. Halford Chessman for 02/01/14@noon . The patient represented to the emergency department on 01/18/2014 because of confusion. His urine drug screen showed only benzodiazepines, and his alcohol level was negative. The patient was brought to the emergency department by the [facility] and paramedics where he was quite  confused and found to have a questionable right lower lobe infiltrate with WBC 20.7 and tachycardia. At the beginning of the hospital admission, the patient was very paranoid and belligerent. The patient continues to be easily irritable, but has improved with Haldol. He has revealed that he has been using crystal meth and cocaine prior to the admission      Assessment/Plan: #1 sepsis Present at the time of admission secondary to healthcare associated pneumonia/empyema. Patient pulled out IV lines and after several attempts refused further placement of IV and has been subsequently transitioned to oral Levaquin.  #2 HCAP/empyema Patient left AMA on 01/11/2049 unclear as to whether patient was compliant with his antibiotics. Patient had received IV cefepime from 12/31/2013 2 01/11/2014 at prior admission. 01/22/2014 WBC is increased patient did not want IV to be placed but stated he will reconsider. Currently on oral Levaquin. Continue flutter valve, bronchial hygiene, bronchodilators. A white count decreasing, may need another chest tube placement by our secondary to possible reaccumulation of effusion per chest x-ray. Repeat CT chest with improved effusion and only residual effusion present with small right lower lobe consolidation. Patient has been seen by pulmonary and given improvement on CT scan with psychiatric history recommend conservative treatment at this time in follow him expectantly with outpatient followup with pulmonary as previously scheduled on 02/01/2014.  #3 bacteremia Likely contaminant. IV vancomycin has been discontinued.  #4 acute encephalopathy Likely secondary to sepsis and drug induced psychosis. Patient is less belligerent and less verbally abusive. Continue alcohol withdrawal protocol. Patient has been seen by psychiatry. Continue Haldol 2 mg by mouth twice a day and continue as needed Haldol for agitation. Patient has been reassessed per psychiatry the patient still lacks  capacity.  #5 acute renal insufficiency Renal function continuing to trend back down. Patient refused  renal ultrasound. Continue hydration with IV fluids and follow.   #6 Impaired glucose tolerance Hgb A1C 5.8  #7 hypertension Stable continue Norvasc.  #8 hypokalemia Repleted.    Code Status: Full Family Communication: Updated patient no family at bedside. Disposition Plan: Home vs SNF when medically stable.   Consultants:  Pulmonary and critical care medicine: Dr. Chase Caller 01/24/2014  Psychiatry: Dr. Melissa Montane 01/23/2014/Dr. Cobos 01/25/2014  Procedures:  Chest x-ray 01/23/2014, 01/18/2014  CT chest 01/24/2014  Antibiotics: Vancomycin 01/18/14>>> 01/21/2014  Zosyn 01/18/14>>>01/21/14  Levofloxacin 01/21/14>>>    HPI/Subjective: Patient alert. No complaints.  Objective: Filed Vitals:   01/27/14 0624  BP: 126/81  Pulse: 86  Temp: 97.7 F (36.5 C)  Resp: 20    Intake/Output Summary (Last 24 hours) at 01/27/14 1126 Last data filed at 01/27/14 1033  Gross per 24 hour  Intake   2320 ml  Output   1400 ml  Net    920 ml   Filed Weights   01/18/14 1728  Weight: 90.4 kg (199 lb 4.7 oz)    Exam:   General:  NAD  Cardiovascular: RRR  Respiratory: CTAB  Abdomen: Soft/NT/ND/+BS  Musculoskeletal: No c/c/e   Data Reviewed: Basic Metabolic Panel:  Recent Labs Lab 01/20/14 1300  01/22/14 0443 01/24/14 0510 01/25/14 0440 01/26/14 0526 01/27/14 0525  NA 141  < > 138 131* 141 138 140  K 3.5*  < > 3.4* 3.5* 4.2 4.0 3.6*  CL 103  < > 100 95* 104 100 103  CO2 25  < > 24 24 24 24 23   GLUCOSE 103*  < > 93 108* 100* 96 103*  BUN 7  < > 7 16 14 14 12   CREATININE 1.09  < > 1.52* 2.13* 2.01* 1.96* 1.60*  CALCIUM 9.3  < > 9.8 10.0 10.1 10.0 9.9  MG 2.0  --   --  2.1  --   --   --   < > = values in this interval not displayed. Liver Function Tests: No results found for this basename: AST, ALT, ALKPHOS, BILITOT, PROT, ALBUMIN,  in the last 168 hours No  results found for this basename: LIPASE, AMYLASE,  in the last 168 hours No results found for this basename: AMMONIA,  in the last 168 hours CBC:  Recent Labs Lab 01/21/14 0830 01/22/14 0443 01/24/14 0510 01/25/14 0440 01/26/14 0526  WBC 10.7* 14.3* 11.8* 10.4 11.5*  NEUTROABS  --   --  8.8*  --   --   HGB 10.7* 11.0* 10.9* 11.4* 11.7*  HCT 32.5* 32.9* 31.8* 32.7* 35.6*  MCV 88.6 88.9 85.7 87.2 87.7  PLT 367 350 337 329 324   Cardiac Enzymes: No results found for this basename: CKTOTAL, CKMB, CKMBINDEX, TROPONINI,  in the last 168 hours BNP (last 3 results)  Recent Labs  12/24/13 0409  PROBNP 538.5*   CBG:  Recent Labs Lab 01/21/14 2253  GLUCAP 163*    Recent Results (from the past 240 hour(s))  CULTURE, BLOOD (ROUTINE X 2)     Status: None   Collection Time    01/18/14  1:04 PM      Result Value Ref Range Status   Specimen Description BLOOD RIGHT ANTECUBITAL   Final   Special Requests BOTTLES DRAWN AEROBIC AND ANAEROBIC 5ML   Final   Culture  Setup Time     Final   Value: 01/18/2014 20:45     Performed at [place]     Final  Value: NO GROWTH 5 DAYS     Performed at [facility]   Report Status 01/24/2014 FINAL   Final  CULTURE, BLOOD (ROUTINE X 2)     Status: None   Collection Time    01/18/14  1:07 PM      Result Value Ref Range Status   Specimen Description BLOOD RIGHT ARM   Final   Special Requests BOTTLES DRAWN AEROBIC ONLY 1ML   Final   Culture  Setup Time     Final   Value: 01/18/2014 20:45     Performed at [facility]   Culture     Final   Value: STAPHYLOCOCCUS SPECIES (COAGULASE NEGATIVE)     Note: CRITICAL RESULT CALLED TO, READ BACK BY AND VERIFIED WITH: EVELYN M RN @1109PM  VINCJ [id number] THE SIGNIFICANCE OF ISOLATING THIS ORGANISM FROM A SINGLE SET OF BLOOD CULTURES WHEN MULTIPLE SETS ARE DRAWN IS UNCERTAIN. PLEASE NOTIFY THE MICROBIOLOGY      DEPARTMENT WITHIN ONE WEEK IF SPECIATION AND SENSITIVITIES ARE  REQUIRED.     Performed at [facility]   Report Status 01/21/2014 FINAL   Final  URINE CULTURE     Status: None   Collection Time    01/18/14  3:43 PM      Result Value Ref Range Status   Specimen Description URINE, RANDOM   Final   Special Requests NONE   Final   Culture  Setup Time     Final   Value: 01/18/2014 20:05     Performed at [place]     Final   Value: NO GROWTH     Performed at [facility]   Culture     Final   Value: NO GROWTH     Performed at [facility]   Report Status 01/19/2014 FINAL   Final  URINE CULTURE     Status: None   Collection Time    01/24/14  8:10 AM      Result Value Ref Range Status   Specimen Description URINE, RANDOM   Final   Special Requests NONE   Final   Culture  Setup Time     Final   Value: 01/24/2014 12:31     Performed at [place]     Final   Value: NO GROWTH     Performed at [facility]   Culture     Final   Value: NO GROWTH     Performed at [facility]   Report Status 01/25/2014 FINAL   Final     Studies: No results found.  Scheduled Meds: . amLODipine  5 mg Oral Daily  . cefpodoxime  200 mg Oral Q12H  . enoxaparin (LOVENOX) injection  40 mg Subcutaneous Q24H  . folic acid  1 mg Oral Daily  . haloperidol  2 mg Oral BID  . Influenza vac split quadrivalent PF  0.5 mL Intramuscular Tomorrow-1000  . multivitamin with minerals  1 tablet Oral Daily  . thiamine  100 mg Oral Daily   Or  . thiamine  100 mg Intravenous Daily   Continuous Infusions: . sodium chloride 0.9 % 1,000 mL infusion 125 mL/hr at 01/26/14 1938    Principal Problem:   Sepsis Active Problems:   Polysubstance abuse   Tachycardia   Acute encephalopathy   Right lower lobe pneumonia   Hyperglycemia   Metabolic acidosis   AKI (acute kidney injury)  Time spent: [place] MD [facility] Pager [phone]. If 7PM-7AM, please  contact night-coverage at [email or web address], password [facility] 01/27/2014, 11:26 AM  LOS: 9 days

## 2014-01-28 LAB — BASIC METABOLIC PANEL
Anion gap: 15 (ref 5–15)
BUN: 12 mg/dL (ref 6–23)
CO2: 22 meq/L (ref 19–32)
CREATININE: 1.44 mg/dL — AB (ref 0.50–1.35)
Calcium: 9.9 mg/dL (ref 8.4–10.5)
Chloride: 101 mEq/L (ref 96–112)
GFR calc Af Amer: 68 mL/min — ABNORMAL LOW (ref >90)
GFR calc non Af Amer: 59 mL/min — ABNORMAL LOW (ref >90)
Glucose, Bld: 97 mg/dL (ref 70–99)
POTASSIUM: 3.7 meq/L (ref 3.7–5.3)
Sodium: 138 mEq/L (ref 137–147)

## 2014-01-28 MED ORDER — DIVALPROEX SODIUM 125 MG PO CPSP
250.0000 mg | ORAL_CAPSULE | Freq: Two times a day (BID) | ORAL | Status: DC
  Administered 2014-01-28 – 2014-01-29 (×3): 250 mg via ORAL
  Filled 2014-01-28 (×4): qty 2

## 2014-01-28 MED ORDER — SODIUM CHLORIDE 0.9 % IV SOLN
INTRAVENOUS | Status: DC
  Administered 2014-01-28 – 2014-01-30 (×3): via INTRAVENOUS

## 2014-01-28 NOTE — Progress Notes (Signed)
[facility] PROGRESS NOTE  Ralph Chavez GUR:[id number] DOB: 1971-02-28 DOA: 01/18/2014 PCP: No PCP Per Patient  Interim summary  43 year old male with a history of hypertension, pneumonia, tobacco abuse, alcohol abuse and polysubstance abuse who had 2 previous admissions to the hospital in August 2015. On his admission from 12/20/2013 through 12/25/2013 the patient had an elevated troponin and underwent cardiac catheterization on 12/24/2013 which showed clean coronaries. During that hospitalization, the patient also had aspiration pneumonitis and pulmonary edema. He was discharged with 5 days of Augmentin. The patient was re-admitted on 12/31/2013 until he left [place] on 01/11/2014. During that admission, the patient had acute respiratory failure secondary to pneumonia with loculated pleural effusion, and he required placement of a right-sided chest tube on 01/01/2014 he had his pigtail catheter removed on August 23. He also had a thoracocentesis 01/04/2014 due to residual loculated collection in the right lung that was not drained by the initial chest tube.. Because of persistent pleural fluid, another R-chest tube was placed by IR on 01/08/2014. He was treated with initially with vancomycin and cefepime. After initially 4 days of vancomycin, cefepime was continued through the hospitalization. All his cultures including blood culture and pleural fluid cultures were negative, and cytology on 01/04/2014 was negative for malignancy. His second chest tube was ultimately discontinued on 01/11/2014 and he was transitioned to oral levofloxacin. Pulmonary followup was set up with Dr. Halford Chessman for 02/01/14@noon . The patient represented to the emergency department on 01/18/2014 because of confusion. His urine drug screen showed only benzodiazepines, and his alcohol level was negative. The patient was brought to the emergency department by the [facility] and paramedics where he was quite  confused and found to have a questionable right lower lobe infiltrate with WBC 20.7 and tachycardia. At the beginning of the hospital admission, the patient was very paranoid and belligerent. The patient continues to be easily irritable, but has improved with Haldol. He has revealed that he has been using crystal meth and cocaine prior to the admission      Assessment/Plan: #1 sepsis Present at the time of admission secondary to healthcare associated pneumonia/empyema. Patient pulled out IV lines and after several attempts refused further placement of IV and has been subsequently transitioned to oral Levaquin.  #2 HCAP/empyema Patient left AMA on 01/11/2014 unclear as to whether patient was compliant with his antibiotics. Patient had received IV cefepime from 12/31/2013 2 01/11/2014 at prior admission. 01/22/2014 WBC is increased patient did not want IV to be placed but stated he will reconsider. Currently on oral Levaquin. Continue flutter valve, bronchial hygiene, bronchodilators. Repeat CT chest with improved effusion and only residual effusion present with small right lower lobe consolidation. Patient has been seen by pulmonary and given improvement on CT scan with psychiatric history recommend conservative treatment at this time in follow him expectantly with outpatient followup with pulmonary as previously scheduled on 02/01/2014.  #3 bacteremia Likely contaminant. IV vancomycin has been discontinued.  #4 acute encephalopathy Likely secondary to sepsis and drug induced psychosis. Patient is less belligerent and less verbally abusive. Continue alcohol withdrawal protocol. Patient has been seen by psychiatry. Continue Haldol 2 mg by mouth twice a day and continue as needed Haldol for agitation. Patient has been reassessed per psychiatry the patient still lacks capacity. Will start low dose depakote for mood stabilization.  #5 acute renal insufficiency Renal function continuing to trend back  down. Patient refused renal ultrasound. Decrease IVF.  #6 Impaired glucose tolerance Hgb A1C 5.8  #  7 hypertension Stable continue Norvasc.  #8 hypokalemia Repleted.    Code Status: Full Family Communication: Updated patient no family at bedside. Disposition Plan: Home vs SNF when medically stable.   Consultants:  Pulmonary and critical care medicine: Dr. Chase Caller 01/24/2014  Psychiatry: Dr. Melissa Montane 01/23/2014/Dr. Cobos 01/25/2014  Procedures:  Chest x-ray 01/23/2014, 01/18/2014  CT chest 01/24/2014  Antibiotics: Vancomycin 01/18/14>>> 01/21/2014  Zosyn 01/18/14>>>01/21/14  Levofloxacin 01/21/14>>>    HPI/Subjective: Patient alert. No complaints. Asking for drink.  Objective: Filed Vitals:   01/28/14 0609  BP: 148/92  Pulse: 93  Temp: 97.7 F (36.5 C)  Resp: 16    Intake/Output Summary (Last 24 hours) at 01/28/14 1355 Last data filed at 01/28/14 1056  Gross per 24 hour  Intake    840 ml  Output   4900 ml  Net  -4060 ml   Filed Weights   01/18/14 1728  Weight: 90.4 kg (199 lb 4.7 oz)    Exam:   General:  NAD  Cardiovascular: RRR  Respiratory: CTAB  Abdomen: Soft/NT/ND/+BS  Musculoskeletal: No c/c/e   Data Reviewed: Basic Metabolic Panel:  Recent Labs Lab 01/24/14 0510 01/25/14 0440 01/26/14 0526 01/27/14 0525 01/28/14 0508  NA 131* 141 138 140 138  K 3.5* 4.2 4.0 3.6* 3.7  CL 95* 104 100 103 101  CO2 24 24 24 23 22   GLUCOSE 108* 100* 96 103* 97  BUN 16 14 14 12 12   CREATININE 2.13* 2.01* 1.96* 1.60* 1.44*  CALCIUM 10.0 10.1 10.0 9.9 9.9  MG 2.1  --   --   --   --    Liver Function Tests: No results found for this basename: AST, ALT, ALKPHOS, BILITOT, PROT, ALBUMIN,  in the last 168 hours No results found for this basename: LIPASE, AMYLASE,  in the last 168 hours No results found for this basename: AMMONIA,  in the last 168 hours CBC:  Recent Labs Lab 01/22/14 0443 01/24/14 0510 01/25/14 0440 01/26/14 0526  WBC 14.3*  11.8* 10.4 11.5*  NEUTROABS  --  8.8*  --   --   HGB 11.0* 10.9* 11.4* 11.7*  HCT 32.9* 31.8* 32.7* 35.6*  MCV 88.9 85.7 87.2 87.7  PLT 350 337 329 324   Cardiac Enzymes: No results found for this basename: CKTOTAL, CKMB, CKMBINDEX, TROPONINI,  in the last 168 hours BNP (last 3 results)  Recent Labs  12/24/13 0409  PROBNP 538.5*   CBG:  Recent Labs Lab 01/21/14 2253  GLUCAP 163*    Recent Results (from the past 240 hour(s))  URINE CULTURE     Status: None   Collection Time    01/18/14  3:43 PM      Result Value Ref Range Status   Specimen Description URINE, RANDOM   Final   Special Requests NONE   Final   Culture  Setup Time     Final   Value: 01/18/2014 20:05     Performed at [facility]     Final   Value: NO GROWTH     Performed at [facility]   Culture     Final   Value: NO GROWTH     Performed at [facility]   Report Status 01/19/2014 FINAL   Final  URINE CULTURE     Status: None   Collection Time    01/24/14  8:10 AM      Result Value Ref Range Status   Specimen Description URINE, RANDOM  Final   Special Requests NONE   Final   Culture  Setup Time     Final   Value: 01/24/2014 12:31     Performed at [facility]     Final   Value: NO GROWTH     Performed at [facility]   Culture     Final   Value: NO GROWTH     Performed at [facility]   Report Status 01/25/2014 FINAL   Final     Studies: No results found.  Scheduled Meds: . amLODipine  5 mg Oral Daily  . cefpodoxime  200 mg Oral Q12H  . divalproex  250 mg Oral Q12H  . enoxaparin (LOVENOX) injection  40 mg Subcutaneous Q24H  . folic acid  1 mg Oral Daily  . haloperidol  2 mg Oral BID  . Influenza vac split quadrivalent PF  0.5 mL Intramuscular Tomorrow-1000  . multivitamin with minerals  1 tablet Oral Daily  . thiamine  100 mg Oral Daily   Or  . thiamine  100 mg Intravenous Daily   Continuous  Infusions: . sodium chloride 0.9 % 1,000 mL infusion 100 mL/hr at 01/28/14 0741    Principal Problem:   Sepsis Active Problems:   Polysubstance abuse   Tachycardia   Acute encephalopathy   Right lower lobe pneumonia   Hyperglycemia   Metabolic acidosis   AKI (acute kidney injury)    Time spent: [place] MD [facility] Pager [phone]. If 7PM-7AM, please contact night-coverage at [email or web address], password [facility] 01/28/2014, 1:55 PM  LOS: 10 days

## 2014-01-28 NOTE — Progress Notes (Signed)
Physical Therapy Treatment Patient Details Name: Ralph Chavez MRN: [id number] DOB: June 04, 1970 Today's Date: 01/28/2014    History of Present Illness [name] brought in [place] police on 9/4 with AMS.Past medical history of alcohol and polysubstance abuse who was  recently in [facility]  for pneumonia complicated by a parapneumonic efusion requiring a chest tube for a period of time. The patient had left AGAINST MEDICAL ADVICE during the hospitalization. At that time, he indicated that he would be going to another hospital, although it is unclear if he did    PT Comments    Pt in room on [facility] with NT.  Assisted with amb with a walker.  Very unsteady/drunken/staggered gait with poor safety cognition and delayed response to directions.  Pt required any where from min to Max assist.  Pt required max assist to direct walker around obstacles and complete turn to recliner.  HIGH FALL RISK.  Follow Up Recommendations  Supervision/Assistance - 24 hour;SNF      Equipment Recommendations  Rolling walker with 5" wheels    Recommendations for Other Services       Precautions / Restrictions Precautions Precautions: Fall Precaution Comments: can be beigerent. Restrictions Weight Bearing Restrictions: No    Mobility  Bed Mobility               General bed mobility comments: Pt OOB on BSC on arrival  Transfers Overall transfer level: Needs assistance Equipment used: Rolling walker (2 wheeled) Transfers: Sit to/from Stand Sit to Stand: Min assist;From elevated surface         General transfer comment: increased time to process direction and 75% VC's on proper tech and hand over hand placement.  Increased time to complete   Ambulation/Gait Ambulation/Gait assistance: Min assist;Max assist Ambulation Distance (Feet): 180 Feet (90 feet x 2 one sitting rest break) Assistive device: Rolling walker (2 wheeled) Gait Pattern/deviations: Step-to pattern;Step-through pattern;Staggering right;Drifts  right/left;Staggering left;Festinating Gait velocity: decreased   General Gait Details: very unsteady, drunken, staggered gait required increased time and 75% VC's on safety esp with turns and around obsticles.  Cognition impaired and pt requires increased time and repeate cueing to complete task at hand.    Stairs            Wheelchair Mobility    Modified Rankin (Stroke Patients Only)       Balance                                    Cognition                            Exercises      General Comments        Pertinent Vitals/Pain      Home Living                      Prior Function            PT Goals (current goals can now be found in the care plan section) Progress towards PT goals: Progressing toward goals    Frequency  Min 2X/week    PT Plan      Co-evaluation             End of Session Equipment Utilized During Treatment: Gait belt Activity Tolerance: Other (comment) (limited cognition/awareness/alertness) Patient left: in chair;with call bell/phone within reach;with chair alarm  set;with nursing/sitter in room     Time: 1002-1030 PT Time Calculation (min): 28 min  Charges:  $Gait Training: 23-37 mins                    G Codes:      Rica Koyanagi  PTA [facility]  Acute  Rehab Pager      [phone]

## 2014-01-29 DIAGNOSIS — R000 Tachycardia, unspecified: Secondary | ICD-10-CM

## 2014-01-29 LAB — BASIC METABOLIC PANEL
ANION GAP: 12 (ref 5–15)
BUN: 12 mg/dL (ref 6–23)
CHLORIDE: 104 meq/L (ref 96–112)
CO2: 25 meq/L (ref 19–32)
CREATININE: 1.42 mg/dL — AB (ref 0.50–1.35)
Calcium: 10.2 mg/dL (ref 8.4–10.5)
GFR calc non Af Amer: 60 mL/min — ABNORMAL LOW (ref >90)
GFR, EST AFRICAN AMERICAN: 70 mL/min — AB (ref >90)
Glucose, Bld: 92 mg/dL (ref 70–99)
POTASSIUM: 3.8 meq/L (ref 3.7–5.3)
Sodium: 141 mEq/L (ref 137–147)

## 2014-01-29 MED ORDER — DIVALPROEX SODIUM 125 MG PO CPSP
500.0000 mg | ORAL_CAPSULE | Freq: Two times a day (BID) | ORAL | Status: DC
  Administered 2014-01-29 – 2014-01-30 (×2): 500 mg via ORAL
  Filled 2014-01-29 (×3): qty 4

## 2014-01-29 NOTE — Progress Notes (Signed)
[facility] PROGRESS NOTE  Ralph Chavez QQP:[id number] DOB: [birth year] DOA: 01/18/2014 PCP: No PCP Per Patient  Interim summary  43 year old male with a history of hypertension, pneumonia, tobacco abuse, alcohol abuse and polysubstance abuse who had 2 previous admissions to the hospital in August 2015. On his admission from 12/20/2013 through 12/25/2013 the patient had an elevated troponin and underwent cardiac catheterization on 12/24/2013 which showed clean coronaries. During that hospitalization, the patient also had aspiration pneumonitis and pulmonary edema. He was discharged with 5 days of Augmentin. The patient was re-admitted on 12/31/2013 until he left [place] on 01/11/2014. During that admission, the patient had acute respiratory failure secondary to pneumonia with loculated pleural effusion, and he required placement of a right-sided chest tube on 01/01/2014 he had his pigtail catheter removed on August 23. He also had a thoracocentesis 01/04/2014 due to residual loculated collection in the right lung that was not drained by the initial chest tube.. Because of persistent pleural fluid, another R-chest tube was placed by IR on 01/08/2014. He was treated with initially with vancomycin and cefepime. After initially 4 days of vancomycin, cefepime was continued through the hospitalization. All his cultures including blood culture and pleural fluid cultures were negative, and cytology on 01/04/2014 was negative for malignancy. His second chest tube was ultimately discontinued on 01/11/2014 and he was transitioned to oral levofloxacin. Pulmonary followup was set up with Dr. Halford Chessman for 02/01/14@noon . The patient represented to the emergency department on 01/18/2014 because of confusion. His urine drug screen showed only benzodiazepines, and his alcohol level was negative. The patient was brought to the emergency department by the [facility] and paramedics where he was quite  confused and found to have a questionable right lower lobe infiltrate with WBC 20.7 and tachycardia. At the beginning of the hospital admission, the patient was very paranoid and belligerent. The patient continues to be easily irritable, but has improved with Haldol. He has revealed that he has been using crystal meth and cocaine prior to the admission. Patient has been deemed by psychiatry to lack capacity. Patient with no family close by and a such makes disposition difficult.      Assessment/Plan: #1 sepsis Present at the time of admission secondary to healthcare associated pneumonia/empyema. Patient pulled out IV lines and after several attempts refused further placement of IV and has been subsequently transitioned to oral Levaquin.  #2 HCAP/empyema Patient left AMA on 01/11/2014 unclear as to whether patient was compliant with his antibiotics. Patient had received IV cefepime from 12/31/2013 2 01/11/2014 at prior admission. 01/22/2014 WBC is increased patient did not want IV to be placed but stated he will reconsider. Currently on oral Levaquin. Continue flutter valve, bronchial hygiene, bronchodilators. Repeat CT chest with improved effusion and only residual effusion present with small right lower lobe consolidation. Patient has been seen by pulmonary and given improvement on CT scan with psychiatric history recommend conservative treatment at this time in follow him expectantly with outpatient followup with pulmonary as previously scheduled on 02/01/2014.  #3 bacteremia Likely contaminant. IV vancomycin has been discontinued.  #4 acute encephalopathy Likely secondary to sepsis and drug induced psychosis. Patient is less belligerent and less verbally abusive, however have an outbursts. Continue alcohol withdrawal protocol. Patient has been seen by psychiatry. Continue Haldol 2 mg by mouth twice a day and continue as needed Haldol for agitation. Will increase Depakote to 500 mg twice daily  for mood stabilization. Will discontinue when necessary Haldol. Patient has been reassessed per  psychiatry the patient still lacks capacity.  Patient will need a safe disposition.  #5 acute renal insufficiency Renal function continuing to trend back down. Patient refused renal ultrasound. Decrease IVF, and NSL in morning if improved.  #6 Impaired glucose tolerance Hgb A1C 5.8  #7 hypertension Stable continue Norvasc.  #8 hypokalemia Repleted.    Code Status: Full Family Communication: Updated patient no family at bedside. Disposition Plan: D/C when safe disposition as psychiatry deems with no capacity.   Consultants:  Pulmonary and critical care medicine: Dr. Chase Caller 01/24/2014  Psychiatry: Dr. Melissa Montane 01/23/2014/Dr. Cobos 01/25/2014  Procedures:  Chest x-ray 01/23/2014, 01/18/2014  CT chest 01/24/2014  Antibiotics: Vancomycin 01/18/14>>> 01/21/2014  Zosyn 01/18/14>>>01/21/14  Levofloxacin 01/21/14>>>    HPI/Subjective: Patient sedated.  Objective: Filed Vitals:   01/29/14 1124  BP: 144/93  Pulse: 98  Temp: 97.8 F (36.6 C)  Resp: 18    Intake/Output Summary (Last 24 hours) at 01/29/14 1327 Last data filed at 01/29/14 1200  Gross per 24 hour  Intake   1295 ml  Output   4875 ml  Net  -3580 ml   Filed Weights   01/18/14 1728  Weight: 90.4 kg (199 lb 4.7 oz)    Exam:   General:  NAD  Cardiovascular: RRR  Respiratory: CTAB  Abdomen: Soft/NT/ND/+BS  Musculoskeletal: No c/c/e   Data Reviewed: Basic Metabolic Panel:  Recent Labs Lab 01/24/14 0510 01/25/14 0440 01/26/14 0526 01/27/14 0525 01/28/14 0508 01/29/14 0452  NA 131* 141 138 140 138 141  K 3.5* 4.2 4.0 3.6* 3.7 3.8  CL 95* 104 100 103 101 104  CO2 24 24 24 23 22 25   GLUCOSE 108* 100* 96 103* 97 92  BUN 16 14 14 12 12 12   CREATININE 2.13* 2.01* 1.96* 1.60* 1.44* 1.42*  CALCIUM 10.0 10.1 10.0 9.9 9.9 10.2  MG 2.1  --   --   --   --   --    Liver Function Tests: No results  found for this basename: AST, ALT, ALKPHOS, BILITOT, PROT, ALBUMIN,  in the last 168 hours No results found for this basename: LIPASE, AMYLASE,  in the last 168 hours No results found for this basename: AMMONIA,  in the last 168 hours CBC:  Recent Labs Lab 01/24/14 0510 01/25/14 0440 01/26/14 0526  WBC 11.8* 10.4 11.5*  NEUTROABS 8.8*  --   --   HGB 10.9* 11.4* 11.7*  HCT 31.8* 32.7* 35.6*  MCV 85.7 87.2 87.7  PLT 337 329 324   Cardiac Enzymes: No results found for this basename: CKTOTAL, CKMB, CKMBINDEX, TROPONINI,  in the last 168 hours BNP (last 3 results)  Recent Labs  12/24/13 0409  PROBNP 538.5*   CBG: No results found for this basename: GLUCAP,  in the last 168 hours  Recent Results (from the past 240 hour(s))  URINE CULTURE     Status: None   Collection Time    01/24/14  8:10 AM      Result Value Ref Range Status   Specimen Description URINE, RANDOM   Final   Special Requests NONE   Final   Culture  Setup Time     Final   Value: 01/24/2014 12:31     Performed at [facility]     Final   Value: NO GROWTH     Performed at [facility]   Culture     Final   Value: NO GROWTH     Performed at  [facility]   Report Status 01/25/2014 FINAL   Final     Studies: No results found.  Scheduled Meds: . amLODipine  5 mg Oral Daily  . cefpodoxime  200 mg Oral Q12H  . divalproex  250 mg Oral Q12H  . enoxaparin (LOVENOX) injection  40 mg Subcutaneous Q24H  . folic acid  1 mg Oral Daily  . haloperidol  2 mg Oral BID  . Influenza vac split quadrivalent PF  0.5 mL Intramuscular Tomorrow-1000  . multivitamin with minerals  1 tablet Oral Daily  . thiamine  100 mg Oral Daily   Or  . thiamine  100 mg Intravenous Daily   Continuous Infusions: . sodium chloride 100 mL/hr at 01/28/14 2151    Principal Problem:   Sepsis Active Problems:   Polysubstance abuse   Tachycardia   Acute encephalopathy   Right lower lobe pneumonia    Hyperglycemia   Metabolic acidosis   AKI (acute kidney injury)    Time spent: [place] MD [facility] Pager [phone]. If 7PM-7AM, please contact night-coverage at [email or web address], password [facility] 01/29/2014, 1:27 PM  LOS: 11 days

## 2014-01-29 NOTE — Progress Notes (Signed)
Clinical Social Work  Patient was discussed during [place] LOS meeting and barriers to DC were addressed.  CSW and CM met with attending MD and also discussed barriers.  CSW met with patient at bedside. Patient confused about his age and date but knows that he is in the hospital and reports he wants to DC home. Patient reports he lives at home in an apartment alone but friends and family check on him often. CSW spoke with patient re: DC plans and if he would be agreeable to placement for rehab or SA treatment. Patient reports he is uninterested in any referrals and wants to DC home.   CSW spoke with mom Ralph Chavez) via phone who reports she has spoken to patient off and on but the last time they spoke patient cussed her out so she has been less engaged with him. Mom reports that patient is an adult and will make his own decision. Mom is aware that patient does not have capacity at this time but reports no one will be able to force patient to do something he does not want to do. Mom reports it is not an option for patient to stay with her and she is agreeable to talk with CSW on phone but cannot provide much further support.  CSW spoke with patient's girlfriend Ralph Chavez [phone]) via phone who reports she is not actually patient's girlfriend but they have been friends for about 3 years. Patient met friend in rehab and were in recovery together for awhile but patient had relapsed. Friend allowed patient to stay with her for a brief time but reports it is not an option for him to return with her because she has a 56 year old child. Friend is agreeable to help as needed and reports patient used to live with a friend Ralph Chavez [phone]) who might be able to assist. CSW received permission from patient to talk with friends re: DC plans. CSW called Ralph Chavez and left a message with CSW contact information.  CSW will continue to follow.  [place], [place] [phone]

## 2014-01-30 MED ORDER — DIVALPROEX SODIUM 125 MG PO CPSP: 500.0000 mg | ORAL_CAPSULE | Freq: Two times a day (BID) | ORAL | Status: DC

## 2014-01-30 MED ORDER — OXYCODONE-ACETAMINOPHEN 5-325 MG PO TABS: 1.0000 | ORAL_TABLET | Freq: Four times a day (QID) | ORAL | Status: DC | PRN

## 2014-01-30 MED ORDER — LORAZEPAM 2 MG PO TABS: 2.0000 mg | ORAL_TABLET | Freq: Four times a day (QID) | ORAL | Status: DC | PRN

## 2014-01-30 MED ORDER — CEFPODOXIME PROXETIL 200 MG PO TABS: 200.0000 mg | ORAL_TABLET | Freq: Two times a day (BID) | ORAL | Status: DC

## 2014-01-30 MED ORDER — PROMETHAZINE HCL 25 MG PO TABS: 25.0000 mg | ORAL_TABLET | Freq: Four times a day (QID) | ORAL | Status: DC | PRN

## 2014-01-30 NOTE — Discharge Summary (Signed)
Physician Discharge Summary  Ralph Chavez EPP:[id number] DOB: 1971-04-08 DOA: 01/18/2014  PCP: No PCP Per Patient  Admit date: 01/18/2014 Discharge date: 01/30/2014  Recommendations for Outpatient Follow-up:  1. Pt will need to follow up with PCP in 2-3 weeks post discharge 2. Please obtain BMP to evaluate electrolytes and kidney function 3. Please also check CBC to evaluate Hg and Hct levels 4. Please note that pt has been determined not to be a candidate for [facility], outpatient follow up recommended 5. NO IVC PAPERS signed as it was determined that pt did not meet criteria   Discharge Diagnoses: Acute encephalopathy  Principal Problem:   Sepsis Active Problems:   Polysubstance abuse   Tachycardia   Acute encephalopathy   Right lower lobe pneumonia   Hyperglycemia   Metabolic acidosis   AKI (acute kidney injury)  Discharge Condition: Stable  Diet recommendation: Heart healthy diet discussed in details   Interim summary  43 year old male with a history of hypertension, pneumonia, tobacco abuse, alcohol abuse and polysubstance abuse who had 2 previous admissions to the hospital in August 2015. On his admission from 12/20/2013 through 12/25/2013 the patient had an elevated troponin and underwent cardiac catheterization on 12/24/2013 which showed clean coronaries. During that hospitalization, the patient also had aspiration pneumonitis and pulmonary edema. He was discharged with 5 days of Augmentin. The patient was re-admitted on 12/31/2013 until he left [place] on 01/11/2014. During that admission, the patient had acute respiratory failure secondary to pneumonia with loculated pleural effusion, and he required placement of a right-sided chest tube on 01/01/2014 he had his pigtail catheter removed on August 23. He also had a thoracocentesis 01/04/2014 due to residual loculated collection in the right lung that was not drained by the initial chest tube.. Because of persistent  pleural fluid, another R-chest tube was placed by IR on 01/08/2014. He was treated with initially with vancomycin and cefepime. After initially 4 days of vancomycin, cefepime was continued through the hospitalization. All his cultures including blood culture and pleural fluid cultures were negative, and cytology on 01/04/2014 was negative for malignancy. His second chest tube was ultimately discontinued on 01/11/2014 and he was transitioned to oral levofloxacin. Pulmonary followup was set up with Dr. Halford Chessman for 02/01/14@noon . The patient represented to the emergency department on 01/18/2014 because of confusion. His urine drug screen showed only benzodiazepines, and his alcohol level was negative. The patient was brought to the emergency department by the [facility] and paramedics where he was quite confused and found to have a questionable right lower lobe infiltrate with WBC 20.7 and tachycardia. At the beginning of the hospital admission, the patient was very paranoid and belligerent. The patient continues to be easily irritable, but has improved with Haldol. He has revealed that he has been using crystal meth and cocaine prior to the admission   Assessment/Plan:  #1 sepsis  Present at the time of admission secondary to healthcare associated pneumonia/empyema. Patient pulled out IV lines and after several attempts refused further placement of IV and has been subsequently transitioned to oral Levaquin. He has completed ABX therapy and is stable for d/c today.  #2 HCAP/empyema  Patient left AMA on 01/11/2014 unclear as to whether patient was compliant with his antibiotics. Patient had received IV cefepime from 12/31/2013 2 01/11/2014 at prior admission. 01/22/2014 WBC is increased patient did not want IV to be placed. Continued flutter valve, bronchial hygiene, bronchodilators and pt tolerated well. Repeat CT chest with improved effusion and only residual  effusion present with small right lower lobe  consolidation. Patient has been seen by pulmonary and given improvement on CT scan with psychiatric history recommend conservative treatment at this time in follow him expectantly with outpatient followup with pulmonary as previously scheduled on 02/01/2014.  #3 bacteremia  Likely contaminant. IV vancomycin has been discontinued.  #4 acute encephalopathy  Likely secondary to sepsis and drug induced psychosis. Patient is less belligerent and less verbally abusive. Continued alcohol withdrawal protocol. Patient has been seen by psychiatry.Patient has been reassessed per psychiatry the patient still lacks capacity. Will start low dose depakote for mood stabilization. It was determined he is not appropriate for [facility].  #5 acute renal insufficiency  Renal function continuing to trend back down. Patient refused renal ultrasound and blood work. #6 Impaired glucose tolerance  Hgb A1C 5.8  #7 hypertension  Stable  #8 hypokalemia  Repleted.   Code Status: Full  Family Communication: Updated patient no family at bedside.  Disposition Plan: Home   Consultants:  Pulmonary and critical care medicine: Dr. Chase Caller 01/24/2014  Psychiatry: Dr. Melissa Montane 01/23/2014/Dr. Cobos 01/25/2014 Procedures:  Chest x-ray 01/23/2014, 01/18/2014  CT chest 01/24/2014 Antibiotics:  Vancomycin 01/18/14>>> 01/21/2014  Zosyn 01/18/14>>>01/21/14  Levofloxacin 01/21/14>>> 01/30/2014  Discharge Exam: Filed Vitals:   01/30/14 0500  BP: 148/89  Pulse: 94  Temp: 98.3 F (36.8 C)  Resp: 20   Filed Vitals:   01/29/14 1124 01/29/14 1300 01/29/14 2055 01/30/14 0500  BP: 144/93 139/76 145/106 148/89  Pulse: 98 77 112 94  Temp: 97.8 F (36.6 C) 98.1 F (36.7 C) 98.6 F (37 C) 98.3 F (36.8 C)  TempSrc: Oral Oral Oral Oral  Resp: 18 20 20 20   Height:      Weight:      SpO2: 99% 100% 99% 97%    General: Pt is alert, follows commands appropriately, not in acute distress Cardiovascular: Regular rate and rhythm, S1/S2 +, no  murmurs, no rubs, no gallops Respiratory: Clear to auscultation bilaterally, no wheezing, no crackles, no rhonchi Abdominal: Soft, non tender, non distended, bowel sounds +, no guarding  Discharge Instructions  Discharge Instructions   Diet - low sodium heart healthy    Complete by:  As directed      Increase activity slowly    Complete by:  As directed             Medication List    STOP taking these medications       amoxicillin-clavulanate 875-125 MG per tablet  Commonly known as:  AUGMENTIN      TAKE these medications       amLODipine 5 MG tablet  Commonly known as:  NORVASC  Take 1 tablet (5 mg total) by mouth daily.     cefpodoxime 200 MG tablet  Commonly known as:  VANTIN  Take 1 tablet (200 mg total) by mouth every 12 (twelve) hours.     divalproex 125 MG capsule  Commonly known as:  DEPAKOTE SPRINKLE  Take 4 capsules (500 mg total) by mouth every 12 (twelve) hours.     LORazepam 2 MG tablet  Commonly known as:  ATIVAN  Take 1 tablet (2 mg total) by mouth every 6 (six) hours as needed (aggitation).     naproxen sodium 220 MG tablet  Commonly known as:  ANAPROX  Take 440 mg by mouth 2 (two) times daily as needed (pain).     omeprazole 20 MG capsule  Commonly known as:  PRILOSEC  Take 20 mg by  mouth daily.     oxyCODONE-acetaminophen 5-325 MG per tablet  Commonly known as:  ROXICET  Take 1 tablet by mouth every 6 (six) hours as needed.     promethazine 25 MG tablet  Commonly known as:  PHENERGAN  Take 1 tablet (25 mg total) by mouth every 6 (six) hours as needed for nausea or vomiting.           Follow-up Information   Follow up with Faye Ramsay, MD. (As needed, If symptoms worsen)    Specialty:  Internal Medicine   Contact information:   [address] [place] [address] [phone]        The results of significant diagnostics from this hospitalization (including imaging, microbiology, ancillary and  laboratory) are listed below for reference.     Microbiology: Recent Results (from the past 240 hour(s))  URINE CULTURE     Status: None   Collection Time    01/24/14  8:10 AM      Result Value Ref Range Status   Specimen Description URINE, RANDOM   Final   Special Requests NONE   Final   Culture  Setup Time     Final   Value: 01/24/2014 12:31     Performed at [facility]     Final   Value: NO GROWTH     Performed at [facility]   Culture     Final   Value: NO GROWTH     Performed at [facility]   Report Status 01/25/2014 FINAL   Final     Labs: Basic Metabolic Panel:  Recent Labs Lab 01/24/14 0510 01/25/14 0440 01/26/14 0526 01/27/14 0525 01/28/14 0508 01/29/14 0452  NA 131* 141 138 140 138 141  K 3.5* 4.2 4.0 3.6* 3.7 3.8  CL 95* 104 100 103 101 104  CO2 24 24 24 23 22 25   GLUCOSE 108* 100* 96 103* 97 92  BUN 16 14 14 12 12 12   CREATININE 2.13* 2.01* 1.96* 1.60* 1.44* 1.42*  CALCIUM 10.0 10.1 10.0 9.9 9.9 10.2  MG 2.1  --   --   --   --   --    Liver Function Tests: No results found for this basename: AST, ALT, ALKPHOS, BILITOT, PROT, ALBUMIN,  in the last 168 hours No results found for this basename: LIPASE, AMYLASE,  in the last 168 hours No results found for this basename: AMMONIA,  in the last 168 hours CBC:  Recent Labs Lab 01/24/14 0510 01/25/14 0440 01/26/14 0526  WBC 11.8* 10.4 11.5*  NEUTROABS 8.8*  --   --   HGB 10.9* 11.4* 11.7*  HCT 31.8* 32.7* 35.6*  MCV 85.7 87.2 87.7  PLT 337 329 324   Cardiac Enzymes: No results found for this basename: CKTOTAL, CKMB, CKMBINDEX, TROPONINI,  in the last 168 hours BNP: BNP (last 3 results)  Recent Labs  12/24/13 0409  PROBNP 538.5*   CBG: No results found for this basename: GLUCAP,  in the last 168 hours   SIGNED: Time coordinating discharge: Over 30 minutes  Faye Ramsay, MD  [facility] 01/30/2014, 11:34 AM Pager [phone]  If  7PM-7AM, please contact night-coverage [email or web address] Password [email or web address]

## 2014-01-30 NOTE — Progress Notes (Signed)
Clinical Social Work  Per progression meeting, patient is medically stable and will DC today. Patient was discussed during [facility] LOS meeting and medical director (Dr. Reynaldo Minium) and director [facility]) do not feel that patient is appropriate for SNF placement. Patient continues to refuse SNF and SA treatment. Patient reports he has called friends but is having trouble finding somewhere to stay. CSW spoke with friend Shanon Brow who confirmed that patient was staying with him but reports patient is not sober and where he is in recovery it is difficult to have someone at this house who is still using. Patient called his friend Joellen Jersey) who came to the hospital and reports she will take him home.   CSW is signing off but available if needed.  [place], [place] [phone]

## 2014-01-30 NOTE — Discharge Instructions (Signed)

## 2014-01-30 NOTE — Care Management Note (Unsigned)
    Page 1 of 1   01/30/2014     4:14:43 PM CARE MANAGEMENT NOTE 01/30/2014  Patient:  Ralph Chavez, Ralph Chavez   Account Number:  [id number]  Date Initiated:  01/23/2014  Documentation initiated by:  Allene Dillon  Subjective/Objective Assessment:   43 year old male admitted with sepsis.     Action/Plan:   From home. PT consult pending.   Anticipated DC Date:  01/30/2014   Anticipated DC Plan:  HOME/SELF CARE  In-house referral  Clinical Social Worker      DC Forensic scientist  CM consult  [place] [facility]      Choice offered to / List presented to:             Status of service:  Completed, signed off Medicare Important Message given?   (If response is "NO", the following Medicare IM given date fields will be blank) Date Medicare IM given:   Medicare IM given by:   Date Additional Medicare IM given:   Additional Medicare IM given by:    Discharge Disposition:    Per UR Regulation:  Reviewed for med. necessity/level of care/duration of stay  If discussed at [place] of Stay Meetings, dates discussed:    Comments:  01/30/14 Allene Dillon RN BSN [phone] Pt will be discharged home with girlfriend Katie. [place] letter prepared and given to her. Rules of the program explained to her. Also gave Joellen Jersey information on the [facility] and [employer]. I emphasized the importance of followup post hospitalization.

## 2014-01-30 NOTE — Progress Notes (Signed)
Physical Therapy Treatment Patient Details Name: Ralph Chavez MRN: [id number] DOB: 1970-11-18 Today's Date: 01/30/2014    History of Present Illness [name] brought in [place] police on 9/4 with AMS.Past medical history of alcohol and polysubstance abuse who was  recently in [facility]  for pneumonia complicated by a parapneumonic efusion requiring a chest tube for a period of time. The patient had left AGAINST MEDICAL ADVICE during the hospitalization. At that time, he indicated that he would be going to another hospital, although it is unclear if he did    PT Comments    Pt in bed naked, incont of urine and restless with sitter in room.  Assisted applying gowns and OOB to amb to BR.  Assisted with standing balance during hygiene.  Assisted with amb in hallway.  Very unsteady/drunken gait with delayed reaction and poor self awareness.  HIGH FALL RISK.   Follow Up Recommendations   (Pt refusing any reccomendation)     Equipment Recommendations  Rolling walker with 5" wheels    Recommendations for Other Services       Precautions / Restrictions Precautions Precautions: Fall Restrictions Weight Bearing Restrictions: No    Mobility  Bed Mobility Overal bed mobility: Needs Assistance Bed Mobility: Supine to Sit     Supine to sit: Min assist;Min guard     General bed mobility comments: required increased assist as he was falling over backward and demon difficulty coordinating mvts to complete task   Transfers Overall transfer level: Needs assistance Equipment used: Rolling walker (2 wheeled) Transfers: Sit to/from Stand Sit to Stand: Mod assist         General transfer comment: 100% VC's and 75% tactile cueing to complete turns and take backward steps.  Poor safety cognition and delayed reaction time.  HIGH FALL RISK.    Ambulation/Gait Ambulation/Gait assistance: Max assist Ambulation Distance (Feet): 34 Feet Assistive device: Rolling walker (2 wheeled) Gait Pattern/deviations:  Step-to pattern;Drifts right/left;Staggering right;Staggering left;Ataxic;Festinating Gait velocity: decreased   General Gait Details: very unsteady/drunken gait with delayed initiation and festining. Required increased physical assist esp with turns and backward gait.  HIGH FALL RISK   Stairs            Wheelchair Mobility    Modified Rankin (Stroke Patients Only)       Balance                                    Cognition Arousal/Alertness: Awake/alert Behavior During Therapy: Restless Overall Cognitive Status: Impaired/Different from baseline Area of Impairment: Following commands;Safety/judgement;Awareness;Problem solving                    Exercises      General Comments        Pertinent Vitals/Pain      Home Living                      Prior Function            PT Goals (current goals can now be found in the care plan section) Progress towards PT goals: Progressing toward goals    Frequency       PT Plan      Co-evaluation             End of Session Equipment Utilized During Treatment: Gait belt   Patient left: in chair;with call bell/phone within reach;with chair alarm set;with nursing/sitter  in room     Time: 1140-1218 PT Time Calculation (min): 38 min  Charges:  $Gait Training: 8-22 mins $Therapeutic Activity: 23-37 mins                    G Codes:      Rica Koyanagi  PTA [facility]  Acute  Rehab Pager      [phone]

## 2014-01-31 LAB — FUNGUS CULTURE W SMEAR: FUNGAL SMEAR: NONE SEEN

## 2014-02-01 ENCOUNTER — Inpatient Hospital Stay: Payer: Self-pay | Admitting: Pulmonary Disease

## 2014-02-17 LAB — AFB CULTURE WITH SMEAR (NOT AT ARMC)
Acid Fast Smear: NONE SEEN
Special Requests: NORMAL

## 2014-04-25 ENCOUNTER — Encounter (HOSPITAL_COMMUNITY): Payer: Self-pay | Admitting: Cardiovascular Disease

## 2014-12-28 IMAGING — CT CT CHEST W/O CM
2 of 4 series · 15 of 36 positions shown, 18 images · non-contrast
Comparison: Chest CT January 04, 2014 and chest radiograph [place]

CLINICAL DATA: Loculated pleural effusion

EXAM:
CT CHEST WITHOUT CONTRAST
TECHNIQUE: Multidetector CT imaging of the chest was performed following the
standard protocol without IV contrast material administration.

[Series 2: chest w/o st · axial · non-contrast · 0.74mm/px · z∈[-441,-166]mm · 12 of 65 slices shown, 15 images]
[im 5/65  mediastinal]
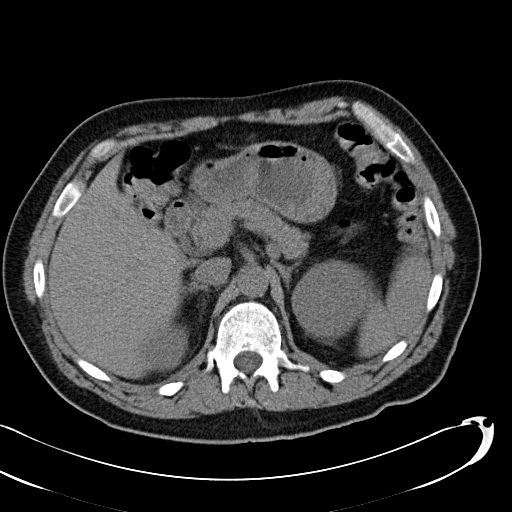
[im 5/65  lung]
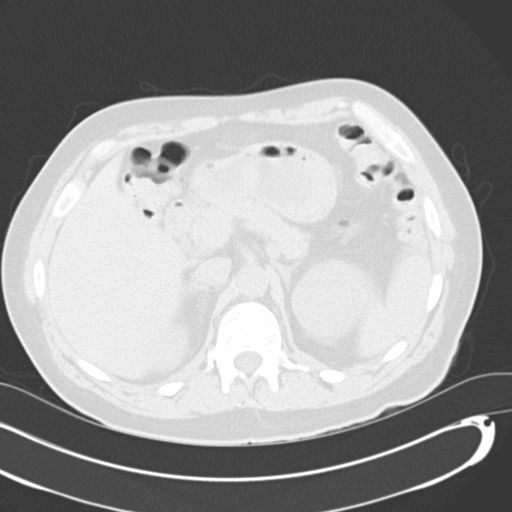
[im 10/65  lung]
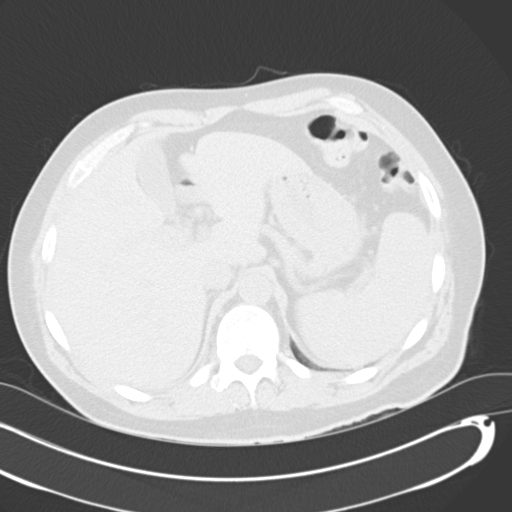
[im 14/65  lung]
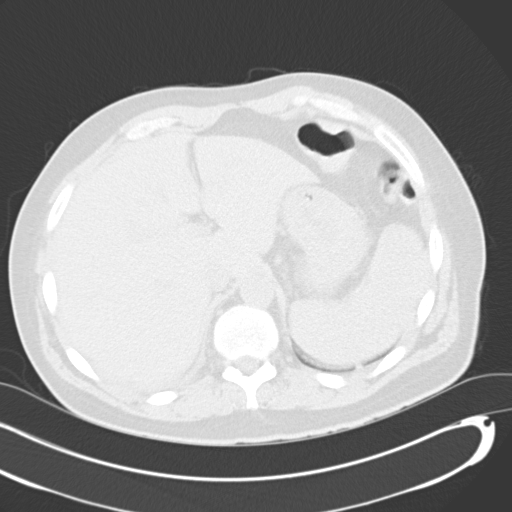
[im 19/65  lung]
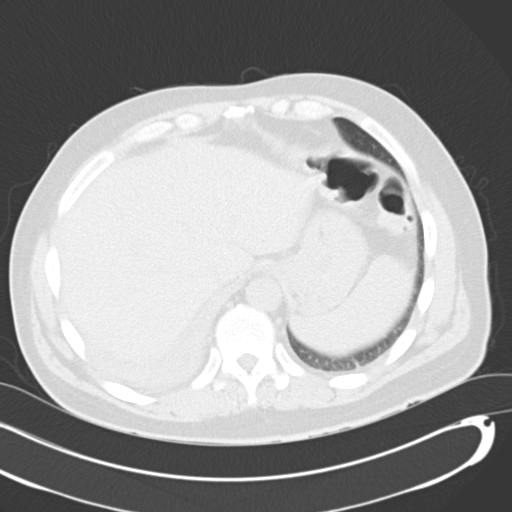
[im 23/65  mediastinal]
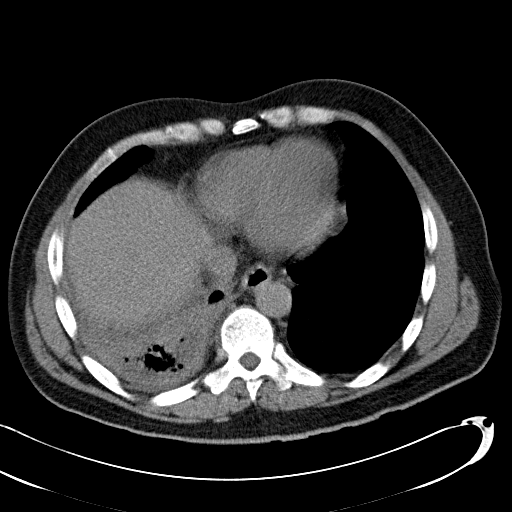
[im 23/65  lung]
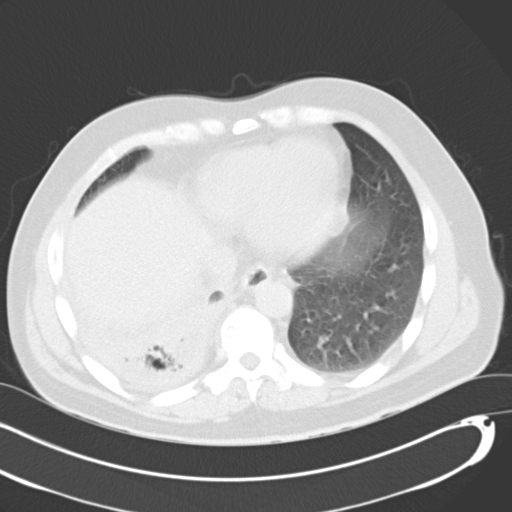
[im 28/65  lung]
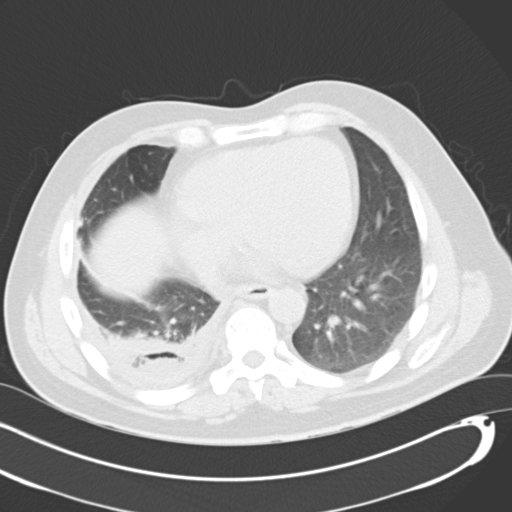
[im 37/65  lung]
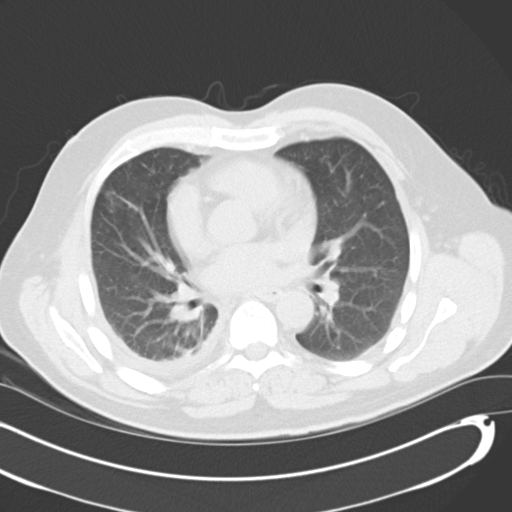
[im 42/65  lung]
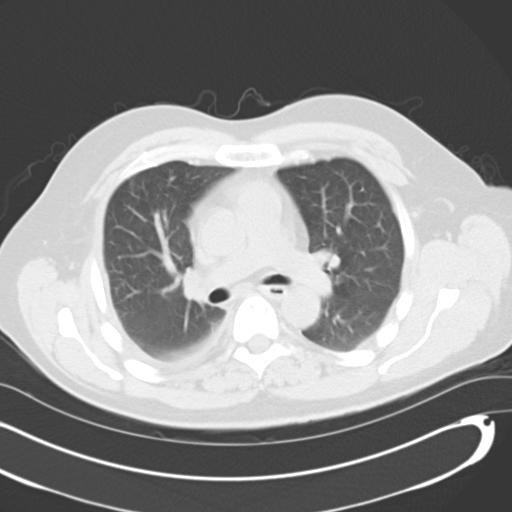
[im 46/65  mediastinal]
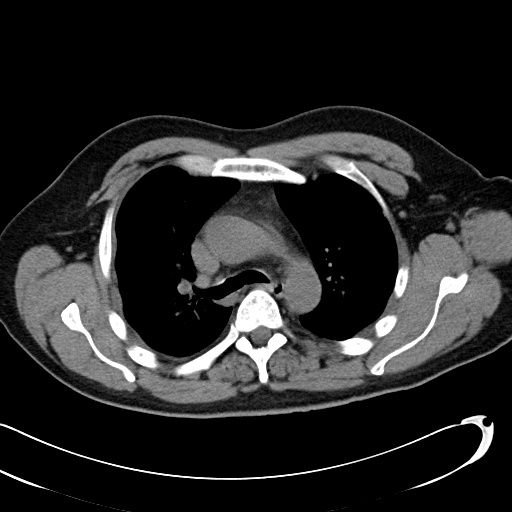
[im 46/65  lung]
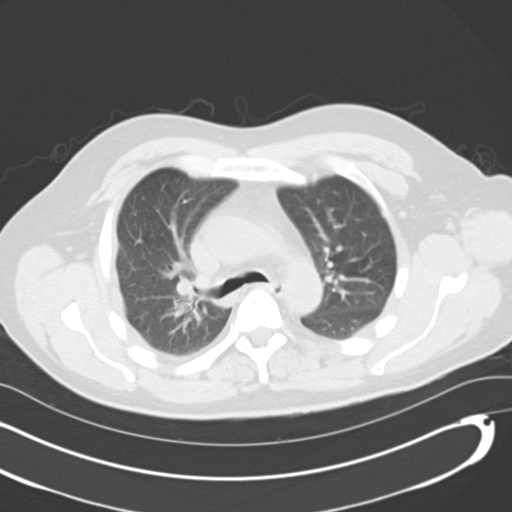
[im 51/65  lung]
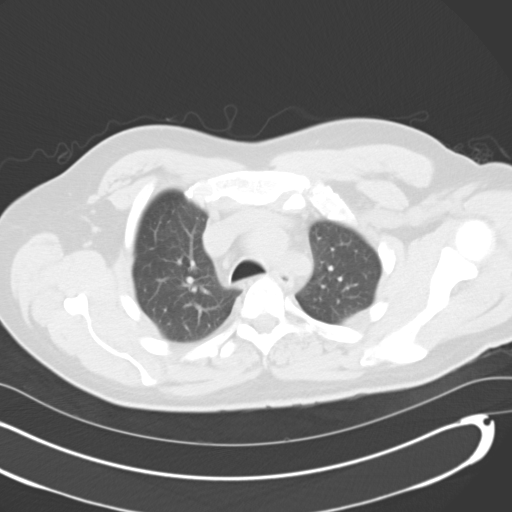
[im 55/65  lung]
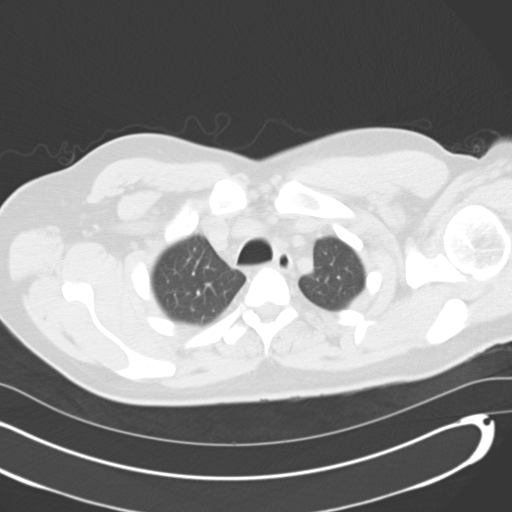
[im 60/65  lung]
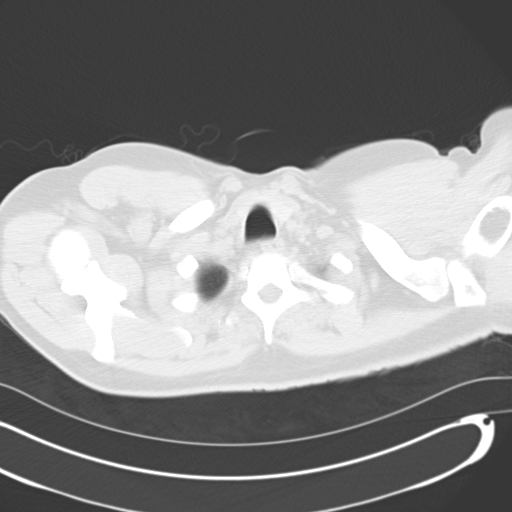

[Series 602: <mpr thick range> · coronal · 0.74mm/px · 3 of 92 slices shown]
[im 19/92  lung]
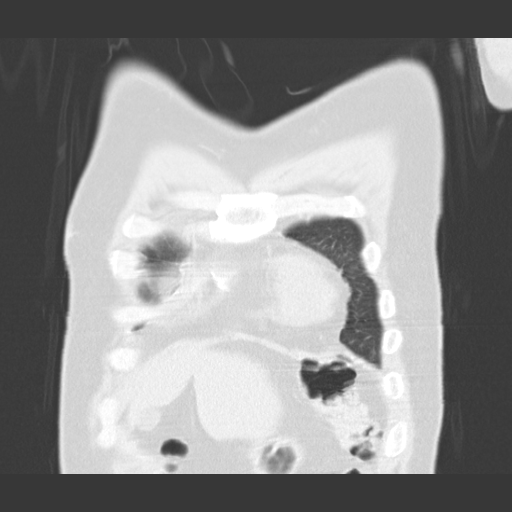
[im 37/92  lung]
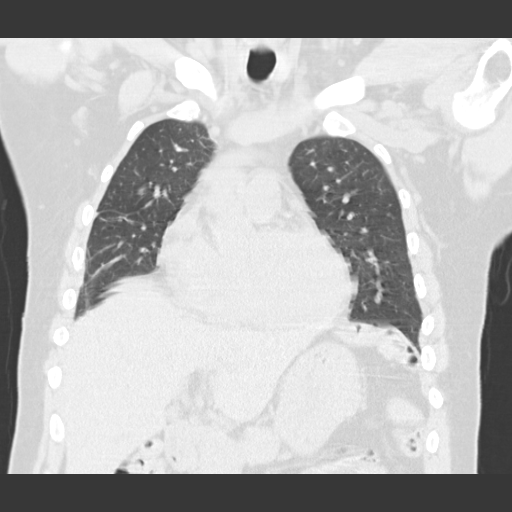
[im 55/92  lung]
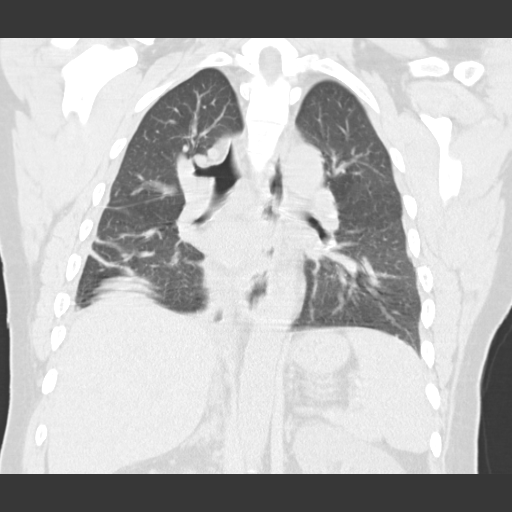

[15 of 36 positions shown; findings below may reference images not displayed]

FINDINGS: There is persistent right lower lobe consolidation with interspersed
effusion in the right base. There is less consolidation effusion in
the right base compared to recent CT examination. There is mild
patchy infiltrate currently in the right middle lobe, less than on
recent prior study.

On axial slice 41 series 5, there is a stable 2 mm nodular opacity
in the inferior lingula. The left lung is otherwise clear except for
some minimal atelectasis in the posterior left base.

There is no appreciable thoracic adenopathy. The pericardium is not
thickened. There is no appreciable thoracic aortic aneurysm. There
is no demonstrable pulmonary embolus on this noncontrast enhanced
study.

Visualized upper abdominal structures appear unremarkable. There are
no blastic or lytic bone lesions. Thyroid appears unremarkable.
IMPRESSION: Compared to recent prior CT, there is less effusion and
consolidation in the right base. There does remain consolidation in
the right base with a fairly small amount of interspersed loculated
fluid at the right base.

There is patchy infiltrate in the right middle lobe.

2 mm nodular opacity in the inferior lingula. Followup of this
nodular opacity should be based on [HOSPITAL] guidelines. If
the patient is at high risk for bronchogenic carcinoma, follow-up
chest CT at 1 year is recommended. If the patient is at low risk, no
follow-up is needed. This recommendation follows the consensus
statement: Guidelines for Management of Small Pulmonary Nodules
Detected on CT Scans: A Statement from the [HOSPITAL] as
published in Radiology 9772; [DATE].

No appreciable adenopathy.

## 2015-11-26 ENCOUNTER — Emergency Department (HOSPITAL_BASED_OUTPATIENT_CLINIC_OR_DEPARTMENT_OTHER)
Admission: EM | Admit: 2015-11-26 | Discharge: 2015-11-26 | Disposition: A | Discharge status: Home / Self Care | Payer: Self-pay | Attending: Emergency Medicine | Admitting: Emergency Medicine

## 2015-11-26 ENCOUNTER — Encounter (HOSPITAL_BASED_OUTPATIENT_CLINIC_OR_DEPARTMENT_OTHER): Payer: Self-pay

## 2015-11-26 ENCOUNTER — Emergency Department (HOSPITAL_BASED_OUTPATIENT_CLINIC_OR_DEPARTMENT_OTHER): Payer: Self-pay

## 2015-11-26 DIAGNOSIS — Z79899 Other long term (current) drug therapy: Secondary | ICD-10-CM | POA: Insufficient documentation

## 2015-11-26 DIAGNOSIS — R05 Cough: Secondary | ICD-10-CM

## 2015-11-26 DIAGNOSIS — F17210 Nicotine dependence, cigarettes, uncomplicated: Secondary | ICD-10-CM | POA: Insufficient documentation

## 2015-11-26 DIAGNOSIS — R059 Cough, unspecified: Secondary | ICD-10-CM

## 2015-11-26 DIAGNOSIS — J0110 Acute frontal sinusitis, unspecified: Secondary | ICD-10-CM | POA: Insufficient documentation

## 2015-11-26 MED ORDER — FLUTICASONE PROPIONATE 50 MCG/ACT NA SUSP: 2.0000 | Freq: Every day | NASAL | Status: DC

## 2015-11-26 MED ORDER — CETIRIZINE HCL 10 MG PO TABS: 10.0000 mg | ORAL_TABLET | Freq: Every day | ORAL | Status: DC

## 2015-11-26 MED ORDER — AMOXICILLIN-POT CLAVULANATE 500-125 MG PO TABS: 1.0000 | ORAL_TABLET | Freq: Two times a day (BID) | ORAL | Status: DC

## 2015-11-26 NOTE — ED Provider Notes (Signed)
CSN: [id number]     Arrival date & time 11/26/15  1151 History   First MD Initiated Contact with Patient 11/26/15 1206     Chief Complaint  Patient presents with  . URI     (Consider location/radiation/quality/duration/timing/severity/associated sxs/prior Treatment) Patient is a 45 y.o. male presenting with URI.  URI Presenting symptoms: congestion, cough and facial pain   Presenting symptoms: no ear pain, no fever, no rhinorrhea and no sore throat   Severity:  Mild Onset quality:  Gradual Duration:  1 week Timing:  Constant Progression:  Unchanged Chronicity:  New Relieved by:  Nothing Worsened by:  Certain positions Ineffective treatments:  None tried Associated symptoms: myalgias ("mild"), sinus pain and wheezing   Associated symptoms: no neck pain   Risk factors: no chronic cardiac disease, no chronic kidney disease and no diabetes mellitus    Ralph Chavez is a 1 ppd smoker 45 y.o. male with PMH significant for anxiety who presents with gradual onset, constant, unchanging sinus pressure, nasal congestion, and cough.  Associated symptoms include chest congestion, occassional wheezing, post nasal drip, and mild myalgias.  No fever, purulent rhinorrhea, sore throat, CP, DOE, PND, orthopnea.  He states he took "2 little white pills" that did not help.  No aggravating factors.  Past Medical History  Diagnosis Date  . Anxiety   . Ulcer    Past Surgical History  Procedure Laterality Date  . Left heart catheterization with coronary angiogram N/A 12/24/2013    Procedure: LEFT HEART CATHETERIZATION WITH CORONARY ANGIOGRAM;  Surgeon: Burnell Blanks, MD;  Location: [facility] CATH LAB;  Service: Cardiovascular;  Laterality: N/A;   No family history on file. Social History  Substance Use Topics  . Smoking status: Current Every Day Smoker    Types: Cigarettes  . Smokeless tobacco: Never Used  . Alcohol Use: No    Review of Systems  Constitutional: Negative for fever.  HENT:  Positive for congestion and sinus pressure. Negative for ear pain, rhinorrhea and sore throat.   Eyes: Negative for pain.  Respiratory: Positive for cough, shortness of breath (occassionally, but not right now) and wheezing.   Cardiovascular: Negative for chest pain.  Gastrointestinal: Negative for nausea, vomiting and diarrhea.  Musculoskeletal: Positive for myalgias ("mild"). Negative for neck pain.  All other systems reviewed and are negative.     Allergies  Review of patient's allergies indicates no known allergies.  Home Medications   Prior to Admission medications   Medication Sig Start Date End Date Taking? Authorizing Provider  GABAPENTIN PO Take by mouth.   Yes Historical Provider, MD   BP 138/71 mmHg  Pulse 80  Temp(Src) 97.6 F (36.4 C) (Oral)  Resp 18  Ht 5\' 9"  (1.753 m)  Wt 97.523 kg  BMI 31.74 kg/m2  SpO2 99% Physical Exam  Constitutional: He is oriented to person, place, and time. He appears well-developed and well-nourished.  Non-toxic appearance. He does not have a sickly appearance. He does not appear ill.  HENT:  Head: Normocephalic and atraumatic.  Right Ear: Tympanic membrane and external ear normal.  Left Ear: Tympanic membrane and external ear normal.  Nose: Nose normal. Right sinus exhibits no maxillary sinus tenderness and no frontal sinus tenderness. Left sinus exhibits no maxillary sinus tenderness and no frontal sinus tenderness.  Mouth/Throat: Uvula is midline, oropharynx is clear and moist and mucous membranes are normal. No oropharyngeal exudate, posterior oropharyngeal edema or posterior oropharyngeal erythema.  Eyes: Conjunctivae are normal.  Neck: Normal range of motion.  Neck supple.  Cardiovascular: Normal rate and regular rhythm.   No lower extremity edema.   Pulmonary/Chest: Effort normal and breath sounds normal. No accessory muscle usage or stridor. No respiratory distress. He has no wheezes. He has no rhonchi. He has no rales.   Abdominal: Soft. Bowel sounds are normal. He exhibits no distension. There is no tenderness.  Musculoskeletal: Normal range of motion.  Lymphadenopathy:    He has no cervical adenopathy.  Neurological: He is alert and oriented to person, place, and time.  Speech clear without dysarthria.  Skin: Skin is warm and dry.  Psychiatric: He has a normal mood and affect. His behavior is normal.    ED Course  Procedures (including critical care time) Labs Review Labs Reviewed - No data to display  Imaging Review Dg Chest 2 View  11/26/2015  CLINICAL DATA:  Nonproductive cough for 1 week EXAM: CHEST  2 VIEW COMPARISON:  01/24/2014 FINDINGS: Cardiac shadow is within normal limits. The lungs are well aerated bilaterally. Chronic scarring in the right lung base is seen and stable. No bony abnormality is seen. IMPRESSION: Chronic scarring in the right lung base. No acute abnormality noted. Electronically Signed   By: Inez Catalina M.D.   On: 11/26/2015 12:52   I have personally reviewed and evaluated these images and lab results as part of my medical decision-making.   EKG Interpretation None      MDM   Final diagnoses:  Acute frontal sinusitis, recurrence not specified  Cough   Findings consistent with sinusitis.  VSS, NAD.  Patient appears well, non-toxic.  HENT exam unremarkable.  Lungs CTAB.  No wheezing.  CXR negative for PNA.  No signs of volume overload. Patient does not have PCP.  Discussed that this is likely viral and to start taking Zyrtec and Flonase.  Prescription for Augmentin provided, and instructed patient to take this only if symptoms worsen. He agrees and understands this.  Return precautions discussed.  Patient agrees and acknowledges the above plan for discharge.       Gloriann Loan, PA-C 11/26/15 [place], MD 11/26/15 [phone]

## 2015-11-26 NOTE — ED Notes (Signed)
C/o sinus congestion nonprod cough x 1 week-NAD-steady gait

## 2015-11-26 NOTE — Discharge Instructions (Signed)

## 2019-05-22 ENCOUNTER — Other Ambulatory Visit: Payer: Self-pay

## 2019-05-22 ENCOUNTER — Ambulatory Visit (HOSPITAL_COMMUNITY)
Admission: RE | Admit: 2019-05-22 | Discharge: 2019-05-22 | Disposition: A | Discharge status: Home / Self Care | Payer: BC Managed Care – PPO | Source: Ambulatory Visit | Attending: Cardiology | Admitting: Cardiology

## 2019-05-22 ENCOUNTER — Other Ambulatory Visit (HOSPITAL_COMMUNITY): Payer: Self-pay | Admitting: Physician Assistant

## 2019-05-22 DIAGNOSIS — R2242 Localized swelling, mass and lump, left lower limb: Secondary | ICD-10-CM | POA: Diagnosis present

## 2019-06-18 NOTE — Progress Notes (Signed)
Subjective:     Patient ID: Ralph Chavez, male    DOB: 09-20-1970, 49 y.o.   MRN: [id number]  Chief Complaint  Patient presents with  . Advice Only    for wound of left leg    HPI: The patient is a 49 y.o. male here for evaluation of acute open left leg wound, referred by Dr. Wylene Simmer   Patient reports an injury on 05/17/2019 where he stepped off a loading dock and fell in between the edge of dock and the tailgate of the truck causing an open wound to his left shin.  He was evaluated at [facility] center on 05/19/19 where he states he had x-rays of his left tibfib that did not show any fractures.    On 05/22/2019 he was evaluted by Dr. Doran Durand for leg pain, swelling and non-wound healing.  He was started on keflex for cellulitis and Doppler study ruled out DVT.  On 05/28/2019 follow up, Ralph Chavez continued to have swelling of his left lower extremity and a large area of central eschar was noted.  Santyl ointment was started.  On 1/27 referral was made for further managment of acute non-healing left leg wound.  Today Ralph Chavez is accompanied by his very supportive wife.  He states he has been doing daily dressing changes with santyl.  He states he continues to have swelling to the left leg however bruising and swelling has improved.  He does report some mild tenderness around the wound and increased foul odor. He denies any fever, chills, nausea or vomiting.   Patient also reports a 1 year history of a mobile mass on the left side of neck.  He reports that this has been slowly increasing in size.  He denies pain, redness, fevers, difficulty in range of motion of the neck.  He would like this evaluated.   Review of Systems  All other systems reviewed and are negative.    has a past medical history of Anxiety and Ulcer.  has a past surgical history that includes left heart catheterization with coronary angiogram (N/A, 12/24/2013).  reports that he has been smoking cigarettes. He  has never used smokeless tobacco. Objective:   Vital Signs BP (!) 170/98 (BP Location: Left Arm, Patient Position: Sitting, Cuff Size: Large)   Pulse 85   Temp 98.9 F (37.2 C) (Temporal)   Ht 5\' 9"  (1.753 m)   Wt 204 lb 9.6 oz (92.8 kg)   SpO2 100%   BMI 30.21 kg/m  Vital Signs and Nursing Note Reviewed   Physical Exam  Neck: No tracheal deviation present. No thyromegaly present.  Left sided lipoma approximately 0.5 x 0.5 inches  Cardiovascular: Intact distal pulses.  Musculoskeletal:     Cervical back: Normal range of motion and neck supple.  Lymphadenopathy:    He has no cervical adenopathy.  Skin:  Left lower extremity:  Granulation tissue present on the medial side of the wound.  Serosangenous exudate noted on the later aspect.  Fat necrosis present along the lateral border.  Peri-wound tenderness and redness noted. Hematoma distal to the wound   Central eschar present Wound measures 8 x 6.5 x 0.5cm              Assessment/Plan:     ICD-10-CM   1. Tobacco use disorder  F17.200   2. Lipoma of neck  D17.0   3. Open wound of leg, left, initial encounter  S81.802A     Assessment:  Open wound  of left lower extremity with central eschar Patient has a one month history of non healing wound. Records reviewed from Dr. Nona Dell office.   Due to fat necrosis and hematoma will need surgical debridement to help facilitate wound healing.  Until then recommended continuing santyl daily at home.  In office debridement of eschar was done using pick ups and scissors.  Wound measured 8 x 6.5 x 0.4 cm, eschar measured 4 x 3 cm prior to debridement.  Post excision measurements were the same for the eschar and 8 x 6.5 x 0.5cm for the wound.  No complications from the procedure.  No obvious signs of infection from the wound.  After debridement Vaseline was placed on the wound and wrapped with ace bandage.   Plan -Eschar removal in the office by sharp debridement.  -Schedule Surgical  debridement in OR - refill santyl through [facility]   Assessment:  Tobacco use disorder Patient reports smoking 1 pack/day.  Discussed importance of smoking cessation for wound healing.  Patient is not ready to quit and I recommended reducing consumption by 1 to 2 cigarettes.  Nutrition was also discussed including increasing protein intake in diet to help with healing.  Plan -Smoking cessation counseling for 3 minutes  Assessment: Lipoma on left side of neck Mobile mass noted to left side of neck most consistent with lipoma.  Patient states he would like the lipoma removed at the same time he has wound debridement.  Plan -Schedule surgical removal of lipoma  Assessment: Pre-diabetes Upon chart review patient had a hemoglobin A1c of 5.8 in 2015 that showed prediabetes.  It will be important to follow this up to ensure he has not developed diabetes and thus hindering his wound healing.    Plan -Hemoglobin A1c   Boyd Kerbs, DO 06/19/2019, 1:02 PM

## 2019-06-19 ENCOUNTER — Encounter: Payer: Self-pay | Admitting: Internal Medicine

## 2019-06-19 ENCOUNTER — Other Ambulatory Visit: Payer: Self-pay

## 2019-06-19 ENCOUNTER — Ambulatory Visit: Payer: BC Managed Care – PPO | Admitting: Internal Medicine

## 2019-06-19 DIAGNOSIS — S81802A Unspecified open wound, left lower leg, initial encounter: Secondary | ICD-10-CM | POA: Insufficient documentation

## 2019-06-19 DIAGNOSIS — F17200 Nicotine dependence, unspecified, uncomplicated: Secondary | ICD-10-CM

## 2019-06-19 DIAGNOSIS — D170 Benign lipomatous neoplasm of skin and subcutaneous tissue of head, face and neck: Secondary | ICD-10-CM

## 2019-06-20 ENCOUNTER — Encounter (HOSPITAL_BASED_OUTPATIENT_CLINIC_OR_DEPARTMENT_OTHER): Payer: Self-pay | Admitting: Plastic Surgery

## 2019-06-20 ENCOUNTER — Other Ambulatory Visit: Payer: Self-pay

## 2019-06-21 ENCOUNTER — Encounter: Payer: Self-pay | Admitting: Plastic Surgery

## 2019-06-21 ENCOUNTER — Encounter: Payer: Self-pay | Admitting: Surgical

## 2019-06-21 ENCOUNTER — Ambulatory Visit (INDEPENDENT_AMBULATORY_CARE_PROVIDER_SITE_OTHER): Payer: BC Managed Care – PPO | Admitting: Surgical

## 2019-06-21 VITALS — BP 130/88 | HR 93 | Temp 99.0°F | Ht 69.0 in | Wt 202.7 lb

## 2019-06-21 DIAGNOSIS — D170 Benign lipomatous neoplasm of skin and subcutaneous tissue of head, face and neck: Secondary | ICD-10-CM

## 2019-06-21 DIAGNOSIS — F17200 Nicotine dependence, unspecified, uncomplicated: Secondary | ICD-10-CM

## 2019-06-21 DIAGNOSIS — S81802D Unspecified open wound, left lower leg, subsequent encounter: Secondary | ICD-10-CM

## 2019-06-21 MED ORDER — ONDANSETRON HCL 4 MG PO TABS: 4.0000 mg | ORAL_TABLET | Freq: Three times a day (TID) | ORAL | 0 refills | Status: AC | PRN

## 2019-06-21 MED ORDER — HYDROCODONE-ACETAMINOPHEN 5-325 MG PO TABS: 1.0000 | ORAL_TABLET | Freq: Four times a day (QID) | ORAL | 0 refills | Status: AC | PRN

## 2019-06-21 NOTE — Progress Notes (Signed)
Patient ID: Ralph Chavez, male    DOB: 08/16/1970, 49 y.o.   MRN: [id number]  Chief Complaint  Patient presents with  . Pre-op Exam    H&P for irrigation and debridement extremity SX: 06/26/19      ICD-10-CM   1. Lipoma of neck  D17.0   2. Open wound of leg, left, subsequent encounter  S81.802D   3. Tobacco use disorder  F17.200     History of Present Illness: Ralph Chavez is a 49 y.o.  male who developed eye open wound to his left shin after falling off a loading dock approximately 1 month ago.  X-ray showed no fractures of his left tib-fib.  He also has a history of a 1 year mobile mass in the left side of his neck has been slowly increasing in size.  He presents for preoperative evaluation for upcoming procedure, irrigation and debridement of left lower extremity, application of meshed primatrix ag, excision of left neck lipoma, scheduled for 06/26/19 with Dr. Mingo Amber.  No hx of anesthesia, no reported fmhx of any issues. No hx of dvt/pe, no fmhx of dvt/pe  He is currently smoking 1 PPD of cigarettes, was recently counseled on decreasing smoking to promote better wound healing, today he reports he is down to ~5 cigarettes per day.  He has a hx of polysubstance abuse. He does not take any blood thinners.   Past Medical History: Allergies: No Known Allergies  Current Medications:  Current Outpatient Medications:  .  lisdexamfetamine (VYVANSE) 60 MG capsule, Take by mouth., Disp: , Rfl:  .  oxcarbazepine (TRILEPTAL) 600 MG tablet, Take 600 mg by mouth 2 (two) times daily., Disp: , Rfl:  .  pantoprazole (PROTONIX) 40 MG tablet, Take 40 mg by mouth daily., Disp: , Rfl:  .  risperiDONE (RISPERDAL) 0.25 MG tablet, Take 0.25 mg by mouth 2 (two) times daily., Disp: , Rfl:  .  SANTYL ointment, APPLY TO CLEANSED AFFECTED AREA BY TOPICAL ROUTE ONCE DAILY, Disp: , Rfl:   Past Medical Problems: Past Medical History:  Diagnosis Date  . Anxiety   . Bipolar disorder ([place])   .  Depression   . GERD (gastroesophageal reflux disease)   . Hypertension    no meds  . Ulcer     Past Surgical History: Past Surgical History:  Procedure Laterality Date  . LEFT HEART CATHETERIZATION WITH CORONARY ANGIOGRAM N/A 12/24/2013   Procedure: LEFT HEART CATHETERIZATION WITH CORONARY ANGIOGRAM;  Surgeon: Burnell Blanks, MD;  Location[facility] C[facility] LAB;  Service: Cardiovascular;  Laterality: N/A;    Social History: Social History   Socioeconomic History  . Marital status: Married    Spouse name: Not on file  . Number of children: Not on file  . Years of education: Not on file  . Highest education level: Not on file  Occupational History  . Not on file  Tobacco Use  . Smoking status: Current Every Day Smoker    Packs/day: 0.25    Types: Cigarettes  . Smokeless tobacco: Never Used  Substance and Sexual Activity  . Alcohol use: Not Currently  . Drug use: Not Currently    Types: Amphetamines, "Crack" cocaine, Methamphetamines, Marijuana, Heroin    Comment: per wife none currently 06/20/19  . Sexual activity: Not on file  Other Topics Concern  . Not on file  Social History Narrative  . Not on file   Social Determinants of Health   Financial Resource Strain:   . Difficulty  of Paying Living Expenses: Not on file  Food Insecurity:   . Worried About Charity fundraiser in the Last Year: Not on file  . Ran Out of Food in the Last Year: Not on file  Transportation Needs:   . Lack of Transportation (Medical): Not on file  . Lack of Transportation (Non-Medical): Not on file  Physical Activity:   . Days of Exercise per Week: Not on file  . Minutes of Exercise per Session: Not on file  Stress:   . Feeling of Stress : Not on file  Social Connections:   . Frequency of Communication with Friends and Family: Not on file  . Frequency of Social Gatherings with Friends and Family: Not on file  . Attends Religious Services: Not on file  . Active Member of Clubs or  Organizations: Not on file  . Attends Archivist Meetings: Not on file  . Marital Status: Not on file  Intimate Partner Violence:   . Fear of Current or Ex-Partner: Not on file  . Emotionally Abused: Not on file  . Physically Abused: Not on file  . Sexually Abused: Not on file    Family History: No family history on file.  Review of Systems: Review of Systems  Constitutional: Negative for chills, fever and malaise/fatigue.  Respiratory: Negative.   Cardiovascular: Negative.   Gastrointestinal: Negative.   Musculoskeletal: Negative.   Neurological: Negative.     Physical Exam: Vital Signs There were no vitals taken for this visit.  Physical Exam Constitutional:      General: He is not in acute distress.    Appearance: Normal appearance. He is normal weight. He is not ill-appearing.  HENT:     Head: Normocephalic and atraumatic.  Neck:     Comments: Soft mobile mass L side of neck, ~ 0.5 x 1 cm.  Cardiovascular:     Rate and Rhythm: Normal rate and regular rhythm.     Pulses: Normal pulses.  Pulmonary:     Effort: Pulmonary effort is normal.     Breath sounds: Normal breath sounds.  Abdominal:     General: Abdomen is flat. Bowel sounds are normal.     Palpations: Abdomen is soft.  Musculoskeletal:        General: No swelling. Normal range of motion.     Cervical back: Normal range of motion.     Right lower leg: No edema.     Left lower leg: No edema.  Skin:    General: Skin is warm and dry.  Neurological:     General: No focal deficit present.     Mental Status: He is alert and oriented to person, place, and time. Mental status is at baseline.  Psychiatric:        Mood and Affect: Mood normal.        Behavior: Behavior normal.       Assessment/Plan: The patient is scheduled for irrigation and debridement of left lower extremity wound, application of primatrix ag to LLE wound, excision of left neck lipoma with Dr. Claudia Desanctis.  We talked about  possibility of using a wound vac postop for bolstering the wound matrix or a bolster.   The risks that can be encountered with and after a wound excision and placement of wound matrix discussed and include the following but not limited to these: bleeding, infection, delayed healing, anesthesia risks, skin sensation changes, injury to structures including nerves, blood vessels, and muscles which may be temporary or permanent,  allergies to tape, suture materials and glues, blood products, topical preparations or injected agents, skin contour irregularities, skin discoloration and swelling, deep vein thrombosis, cardiac and pulmonary complications, pain, which may persist, failure of the graft and possible need for revisional surgery or staged procedures.  Thoroughly covered all risks and answered all questions. He is returning to work for the next 2 days and then will be out of work for a minimum of 1 week. He was provided with a work noted.  Rx sent to pharmacy. COVID test scheduled. Continue with current daily dressing changes.   Smoking status: ~ 5 cigarettes per day, counseling provided Caprini score: 2, low risk (1.5% w/o any ppx measures), mechanical ppx and early ambulation  Electronically signed by: Carola Rhine Cordney Barstow, PA-C 06/21/2019 8:19 AM

## 2019-06-21 NOTE — H&P (View-Only) (Signed)
Patient ID: Ralph Chavez, male    DOB: 27-May-1970, 49 y.o.   MRN: [id number]  Chief Complaint  Patient presents with  . Pre-op Exam    H&P for irrigation and debridement extremity SX: 06/26/19      ICD-10-CM   1. Lipoma of neck  D17.0   2. Open wound of leg, left, subsequent encounter  S81.802D   3. Tobacco use disorder  F17.200     History of Present Illness: Ralph Chavez is a 49 y.o.  male who developed eye open wound to his left shin after falling off a loading dock approximately 1 month ago.  X-ray showed no fractures of his left tib-fib.  He also has a history of a 1 year mobile mass in the left side of his neck has been slowly increasing in size.  He presents for preoperative evaluation for upcoming procedure, irrigation and debridement of left lower extremity, application of meshed primatrix ag, excision of left neck lipoma, scheduled for 06/26/19 with Dr. Mingo Amber.  No hx of anesthesia, no reported fmhx of any issues. No hx of dvt/pe, no fmhx of dvt/pe  He is currently smoking 1 PPD of cigarettes, was recently counseled on decreasing smoking to promote better wound healing, today he reports he is down to ~5 cigarettes per day.  He has a hx of polysubstance abuse. He does not take any blood thinners.   Past Medical History: Allergies: No Known Allergies  Current Medications:  Current Outpatient Medications:  .  lisdexamfetamine (VYVANSE) 60 MG capsule, Take by mouth., Disp: , Rfl:  .  oxcarbazepine (TRILEPTAL) 600 MG tablet, Take 600 mg by mouth 2 (two) times daily., Disp: , Rfl:  .  pantoprazole (PROTONIX) 40 MG tablet, Take 40 mg by mouth daily., Disp: , Rfl:  .  risperiDONE (RISPERDAL) 0.25 MG tablet, Take 0.25 mg by mouth 2 (two) times daily., Disp: , Rfl:  .  SANTYL ointment, APPLY TO CLEANSED AFFECTED AREA BY TOPICAL ROUTE ONCE DAILY, Disp: , Rfl:   Past Medical Problems: Past Medical History:  Diagnosis Date  . Anxiety   . Bipolar disorder ([place])   .  Depression   . GERD (gastroesophageal reflux disease)   . Hypertension    no meds  . Ulcer     Past Surgical History: Past Surgical History:  Procedure Laterality Date  . LEFT HEART CATHETERIZATION WITH CORONARY ANGIOGRAM N/A 12/24/2013   Procedure: LEFT HEART CATHETERIZATION WITH CORONARY ANGIOGRAM;  Surgeon: Burnell Blanks, MD;  Location: [facility] CATH LAB;  Service: Cardiovascular;  Laterality: N/A;    Social History: Social History   Socioeconomic History  . Marital status: Married    Spouse name: Not on file  . Number of children: Not on file  . Years of education: Not on file  . Highest education level: Not on file  Occupational History  . Not on file  Tobacco Use  . Smoking status: Current Every Day Smoker    Packs/day: 0.25    Types: Cigarettes  . Smokeless tobacco: Never Used  Substance and Sexual Activity  . Alcohol use: Not Currently  . Drug use: Not Currently    Types: Amphetamines, "Crack" cocaine, Methamphetamines, Marijuana, Heroin    Comment: per wife none currently 06/20/19  . Sexual activity: Not on file  Other Topics Concern  . Not on file  Social History Narrative  . Not on file   Social Determinants of Health   Financial Resource Strain:   . Difficulty  of Paying Living Expenses: Not on file  Food Insecurity:   . Worried About Charity fundraiser in the Last Year: Not on file  . Ran Out of Food in the Last Year: Not on file  Transportation Needs:   . Lack of Transportation (Medical): Not on file  . Lack of Transportation (Non-Medical): Not on file  Physical Activity:   . Days of Exercise per Week: Not on file  . Minutes of Exercise per Session: Not on file  Stress:   . Feeling of Stress : Not on file  Social Connections:   . Frequency of Communication with Friends and Family: Not on file  . Frequency of Social Gatherings with Friends and Family: Not on file  . Attends Religious Services: Not on file  . Active Member of Clubs or  Organizations: Not on file  . Attends Archivist Meetings: Not on file  . Marital Status: Not on file  Intimate Partner Violence:   . Fear of Current or Ex-Partner: Not on file  . Emotionally Abused: Not on file  . Physically Abused: Not on file  . Sexually Abused: Not on file    Family History: No family history on file.  Review of Systems: Review of Systems  Constitutional: Negative for chills, fever and malaise/fatigue.  Respiratory: Negative.   Cardiovascular: Negative.   Gastrointestinal: Negative.   Musculoskeletal: Negative.   Neurological: Negative.     Physical Exam: Vital Signs There were no vitals taken for this visit.  Physical Exam Constitutional:      General: He is not in acute distress.    Appearance: Normal appearance. He is normal weight. He is not ill-appearing.  HENT:     Head: Normocephalic and atraumatic.  Neck:     Comments: Soft mobile mass L side of neck, ~ 0.5 x 1 cm.  Cardiovascular:     Rate and Rhythm: Normal rate and regular rhythm.     Pulses: Normal pulses.  Pulmonary:     Effort: Pulmonary effort is normal.     Breath sounds: Normal breath sounds.  Abdominal:     General: Abdomen is flat. Bowel sounds are normal.     Palpations: Abdomen is soft.  Musculoskeletal:        General: No swelling. Normal range of motion.     Cervical back: Normal range of motion.     Right lower leg: No edema.     Left lower leg: No edema.  Skin:    General: Skin is warm and dry.  Neurological:     General: No focal deficit present.     Mental Status: He is alert and oriented to person, place, and time. Mental status is at baseline.  Psychiatric:        Mood and Affect: Mood normal.        Behavior: Behavior normal.       Assessment/Plan: The patient is scheduled for irrigation and debridement of left lower extremity wound, application of primatrix ag to LLE wound, excision of left neck lipoma with Dr. Claudia Desanctis.  We talked about  possibility of using a wound vac postop for bolstering the wound matrix or a bolster.   The risks that can be encountered with and after a wound excision and placement of wound matrix discussed and include the following but not limited to these: bleeding, infection, delayed healing, anesthesia risks, skin sensation changes, injury to structures including nerves, blood vessels, and muscles which may be temporary or permanent,  allergies to tape, suture materials and glues, blood products, topical preparations or injected agents, skin contour irregularities, skin discoloration and swelling, deep vein thrombosis, cardiac and pulmonary complications, pain, which may persist, failure of the graft and possible need for revisional surgery or staged procedures.  Thoroughly covered all risks and answered all questions. He is returning to work for the next 2 days and then will be out of work for a minimum of 1 week. He was provided with a work noted.  Rx sent to pharmacy. COVID test scheduled. Continue with current daily dressing changes.   Smoking status: ~ 5 cigarettes per day, counseling provided Caprini score: 2, low risk (1.5% w/o any ppx measures), mechanical ppx and early ambulation  Electronically signed by: Carola Rhine Shayra Anton, PA-C 06/21/2019 8:19 AM

## 2019-06-22 ENCOUNTER — Other Ambulatory Visit: Payer: Self-pay | Admitting: Surgical

## 2019-06-23 ENCOUNTER — Other Ambulatory Visit (HOSPITAL_COMMUNITY)
Admission: RE | Admit: 2019-06-23 | Discharge: 2019-06-23 | Disposition: A | Discharge status: Home / Self Care | Payer: BC Managed Care – PPO | Source: Ambulatory Visit | Attending: Plastic Surgery | Admitting: Plastic Surgery

## 2019-06-23 DIAGNOSIS — Z20822 Contact with and (suspected) exposure to covid-19: Secondary | ICD-10-CM | POA: Diagnosis not present

## 2019-06-23 DIAGNOSIS — Z01812 Encounter for preprocedural laboratory examination: Secondary | ICD-10-CM | POA: Diagnosis not present

## 2019-06-23 LAB — SARS CORONAVIRUS 2 (TAT 6-24 HRS): SARS Coronavirus 2: NEGATIVE

## 2019-06-25 ENCOUNTER — Telehealth: Payer: Self-pay | Admitting: *Deleted

## 2019-06-25 NOTE — Telephone Encounter (Signed)
Faxed recent office notes on (06/20/19) to Dr. Jenny Reichmann [place]-per Dr. Heber Quitman.  Confirmation received.//AB/CMA

## 2019-06-25 NOTE — Telephone Encounter (Signed)
Faxed order to [facility] on (06/20/19) for medical supplies for the patient.  Confirmation received.    Received order status notification stating that there are issues delaying service to your patient.//AB/CMA

## 2019-06-26 ENCOUNTER — Encounter (HOSPITAL_BASED_OUTPATIENT_CLINIC_OR_DEPARTMENT_OTHER): Payer: Self-pay | Admitting: Plastic Surgery

## 2019-06-26 ENCOUNTER — Ambulatory Visit (HOSPITAL_BASED_OUTPATIENT_CLINIC_OR_DEPARTMENT_OTHER): Payer: BC Managed Care – PPO | Admitting: Certified Registered"

## 2019-06-26 ENCOUNTER — Encounter (HOSPITAL_BASED_OUTPATIENT_CLINIC_OR_DEPARTMENT_OTHER)
Admission: RE | Disposition: A | Discharge status: Home / Self Care | Payer: Self-pay | Source: Home / Self Care | Attending: Plastic Surgery

## 2019-06-26 ENCOUNTER — Ambulatory Visit (HOSPITAL_BASED_OUTPATIENT_CLINIC_OR_DEPARTMENT_OTHER)
Admission: RE | Admit: 2019-06-26 | Discharge: 2019-06-26 | Disposition: A | Discharge status: Home / Self Care | Payer: BC Managed Care – PPO | Attending: Plastic Surgery | Admitting: Plastic Surgery

## 2019-06-26 ENCOUNTER — Other Ambulatory Visit: Payer: Self-pay

## 2019-06-26 DIAGNOSIS — Z79899 Other long term (current) drug therapy: Secondary | ICD-10-CM | POA: Insufficient documentation

## 2019-06-26 DIAGNOSIS — F319 Bipolar disorder, unspecified: Secondary | ICD-10-CM | POA: Diagnosis not present

## 2019-06-26 DIAGNOSIS — F17210 Nicotine dependence, cigarettes, uncomplicated: Secondary | ICD-10-CM | POA: Insufficient documentation

## 2019-06-26 DIAGNOSIS — D170 Benign lipomatous neoplasm of skin and subcutaneous tissue of head, face and neck: Secondary | ICD-10-CM | POA: Diagnosis not present

## 2019-06-26 DIAGNOSIS — W1789XA Other fall from one level to another, initial encounter: Secondary | ICD-10-CM | POA: Insufficient documentation

## 2019-06-26 DIAGNOSIS — S81802A Unspecified open wound, left lower leg, initial encounter: Secondary | ICD-10-CM

## 2019-06-26 DIAGNOSIS — K219 Gastro-esophageal reflux disease without esophagitis: Secondary | ICD-10-CM | POA: Insufficient documentation

## 2019-06-26 DIAGNOSIS — I10 Essential (primary) hypertension: Secondary | ICD-10-CM | POA: Insufficient documentation

## 2019-06-26 HISTORY — PX: I & D EXTREMITY: SHX5045

## 2019-06-26 HISTORY — DX: Essential (primary) hypertension: I10

## 2019-06-26 HISTORY — DX: Gastro-esophageal reflux disease without esophagitis: K21.9

## 2019-06-26 HISTORY — DX: Bipolar disorder, unspecified: F31.9

## 2019-06-26 HISTORY — DX: Depression, unspecified: F32.A

## 2019-06-26 HISTORY — PX: ALLOGRAFT APPLICATION: SHX6404

## 2019-06-26 HISTORY — PX: LIPOMA EXCISION: SHX5283

## 2019-06-26 SURGERY — EXCISION LIPOMA
Anesthesia: General | Site: Neck | Laterality: Left

## 2019-06-26 MED ORDER — ACETAMINOPHEN 500 MG PO TABS
ORAL_TABLET | ORAL | Status: AC
  Filled 2019-06-26: qty 1

## 2019-06-26 MED ORDER — LIDOCAINE-EPINEPHRINE 0.5 %-1:200000 IJ SOLN
INTRAMUSCULAR | Status: AC
  Filled 2019-06-26: qty 1

## 2019-06-26 MED ORDER — CEFAZOLIN SODIUM-DEXTROSE 2-4 GM/100ML-% IV SOLN
2.0000 g | INTRAVENOUS | Status: AC
  Administered 2019-06-26: 11:00:00 2 g via INTRAVENOUS

## 2019-06-26 MED ORDER — LIDOCAINE-EPINEPHRINE 1 %-1:100000 IJ SOLN
INTRAMUSCULAR | Status: AC
  Filled 2019-06-26: qty 1

## 2019-06-26 MED ORDER — FENTANYL CITRATE (PF) 100 MCG/2ML IJ SOLN: 25.0000 ug | INTRAMUSCULAR | Status: DC | PRN

## 2019-06-26 MED ORDER — LIDOCAINE HCL (PF) 1 % IJ SOLN
INTRAMUSCULAR | Status: DC | PRN
  Administered 2019-06-26: 10 mL

## 2019-06-26 MED ORDER — DEXAMETHASONE SODIUM PHOSPHATE 10 MG/ML IJ SOLN
INTRAMUSCULAR | Status: DC | PRN
  Administered 2019-06-26: 5 mg via INTRAVENOUS

## 2019-06-26 MED ORDER — LACTATED RINGERS IV SOLN: INTRAVENOUS | Status: DC

## 2019-06-26 MED ORDER — PROPOFOL 10 MG/ML IV BOLUS
INTRAVENOUS | Status: DC | PRN
  Administered 2019-06-26: 200 mg via INTRAVENOUS

## 2019-06-26 MED ORDER — ONDANSETRON HCL 4 MG/2ML IJ SOLN
INTRAMUSCULAR | Status: DC | PRN
  Administered 2019-06-26: 4 mg via INTRAVENOUS

## 2019-06-26 MED ORDER — MIDAZOLAM HCL 5 MG/5ML IJ SOLN
INTRAMUSCULAR | Status: DC | PRN
  Administered 2019-06-26: 2 mg via INTRAVENOUS

## 2019-06-26 MED ORDER — BUPIVACAINE HCL (PF) 0.25 % IJ SOLN
INTRAMUSCULAR | Status: DC | PRN
  Administered 2019-06-26: 10 mL

## 2019-06-26 MED ORDER — ACETAMINOPHEN 500 MG PO TABS
1000.0000 mg | ORAL_TABLET | Freq: Once | ORAL | Status: AC
  Administered 2019-06-26: 1000 mg via ORAL

## 2019-06-26 MED ORDER — MIDAZOLAM HCL 2 MG/2ML IJ SOLN
INTRAMUSCULAR | Status: AC
  Filled 2019-06-26: qty 2

## 2019-06-26 MED ORDER — FENTANYL CITRATE (PF) 100 MCG/2ML IJ SOLN
INTRAMUSCULAR | Status: AC
  Filled 2019-06-26: qty 2

## 2019-06-26 MED ORDER — LIDOCAINE-EPINEPHRINE 2 %-1:100000 IJ SOLN
INTRAMUSCULAR | Status: AC
  Filled 2019-06-26: qty 1

## 2019-06-26 MED ORDER — LIDOCAINE 2% (20 MG/ML) 5 ML SYRINGE
INTRAMUSCULAR | Status: DC | PRN
  Administered 2019-06-26: 100 mg via INTRAVENOUS

## 2019-06-26 MED ORDER — CEFAZOLIN SODIUM-DEXTROSE 2-4 GM/100ML-% IV SOLN
INTRAVENOUS | Status: AC
  Filled 2019-06-26: qty 100

## 2019-06-26 MED ORDER — FENTANYL CITRATE (PF) 100 MCG/2ML IJ SOLN
INTRAMUSCULAR | Status: DC | PRN
  Administered 2019-06-26 (×3): 25 ug via INTRAVENOUS

## 2019-06-26 MED ORDER — PHENYLEPHRINE 40 MCG/ML (10ML) SYRINGE FOR IV PUSH (FOR BLOOD PRESSURE SUPPORT)
PREFILLED_SYRINGE | INTRAVENOUS | Status: DC | PRN
  Administered 2019-06-26 (×2): 80 ug via INTRAVENOUS

## 2019-06-26 SURGICAL SUPPLY — 54 items
BAG DECANTER FOR FLEXI CONT (MISCELLANEOUS) IMPLANT
BLADE MINI RND TIP GREEN BEAV (BLADE) IMPLANT
BLADE SURG 15 STRL LF DISP TIS (BLADE) ×2 IMPLANT
BLADE SURG 15 STRL SS (BLADE) ×4
BNDG COHESIVE 1X5 TAN STRL LF (GAUZE/BANDAGES/DRESSINGS) IMPLANT
BNDG COHESIVE 3X5 TAN STRL LF (GAUZE/BANDAGES/DRESSINGS) IMPLANT
BNDG ESMARK 4X9 LF (GAUZE/BANDAGES/DRESSINGS) IMPLANT
BNDG GAUZE ELAST 4 BULKY (GAUZE/BANDAGES/DRESSINGS) ×4 IMPLANT
CHLORAPREP W/TINT 26 (MISCELLANEOUS) ×4 IMPLANT
CORD BIPOLAR FORCEPS 12FT (ELECTRODE) ×4 IMPLANT
COVER BACK TABLE 60X90IN (DRAPES) ×4 IMPLANT
COVER MAYO STAND STRL (DRAPES) ×4 IMPLANT
COVER WAND RF STERILE (DRAPES) IMPLANT
CUFF TOURN SGL QUICK 18X4 (TOURNIQUET CUFF) IMPLANT
DRAPE EXTREMITY T 121X128X90 (DISPOSABLE) ×4 IMPLANT
DRAPE SURG 17X23 STRL (DRAPES) ×4 IMPLANT
DRAPE UTILITY XL STRL (DRAPES) ×4 IMPLANT
DRSG PAD ABDOMINAL 8X10 ST (GAUZE/BANDAGES/DRESSINGS) ×4 IMPLANT
GAUZE PACKING IODOFORM 1/4X15 (GAUZE/BANDAGES/DRESSINGS) IMPLANT
GAUZE SPONGE 4X4 12PLY STRL (GAUZE/BANDAGES/DRESSINGS) ×4 IMPLANT
GAUZE XEROFORM 1X8 LF (GAUZE/BANDAGES/DRESSINGS) ×4 IMPLANT
GLOVE BIOGEL M STRL SZ7.5 (GLOVE) ×8 IMPLANT
GLOVE BIOGEL PI IND STRL 8 (GLOVE) ×4 IMPLANT
GLOVE BIOGEL PI INDICATOR 8 (GLOVE) ×4
GOWN STRL REUS W/ TWL LRG LVL3 (GOWN DISPOSABLE) ×8 IMPLANT
GOWN STRL REUS W/TWL LRG LVL3 (GOWN DISPOSABLE) ×16
GOWN STRL REUS W/TWL XL LVL3 (GOWN DISPOSABLE) IMPLANT
LOOP VESSEL MAXI BLUE (MISCELLANEOUS) IMPLANT
MARKER SKIN DUAL TIP RULER LAB (MISCELLANEOUS) ×4 IMPLANT
NEEDLE PRECISIONGLIDE 27X1.5 (NEEDLE) ×8 IMPLANT
NS IRRIG 1000ML POUR BTL (IV SOLUTION) ×8 IMPLANT
PACK BASIN DAY SURGERY FS (CUSTOM PROCEDURE TRAY) ×4 IMPLANT
PAD CAST 3X4 CTTN HI CHSV (CAST SUPPLIES) IMPLANT
PADDING CAST ABS 4INX4YD NS (CAST SUPPLIES)
PADDING CAST ABS COTTON 4X4 ST (CAST SUPPLIES) IMPLANT
PADDING CAST COTTON 3X4 STRL (CAST SUPPLIES)
PENCIL SMOKE EVACUATOR (MISCELLANEOUS) ×4 IMPLANT
PRIMATRIX AG 8X8 (Miscellaneous) ×4 IMPLANT
SHEET MEDIUM DRAPE 40X70 STRL (DRAPES) ×4 IMPLANT
SPLINT PLASTER CAST XFAST 3X15 (CAST SUPPLIES) IMPLANT
SPLINT PLASTER XTRA FASTSET 3X (CAST SUPPLIES)
STOCKINETTE 4X48 STRL (DRAPES) ×4 IMPLANT
STOCKINETTE 6  STRL (DRAPES) ×4
STOCKINETTE 6 STRL (DRAPES) ×2 IMPLANT
SUT CHROMIC 4 0 PS 2 18 (SUTURE) ×8 IMPLANT
SUT ETHILON 4 0 PS 2 18 (SUTURE) ×12 IMPLANT
SUT MNCRL AB 4-0 PS2 18 (SUTURE) ×4 IMPLANT
SWAB COLLECTION DEVICE MRSA (MISCELLANEOUS) IMPLANT
SWAB CULTURE ESWAB REG 1ML (MISCELLANEOUS) IMPLANT
SYR BULB 3OZ (MISCELLANEOUS) ×4 IMPLANT
SYR CONTROL 10ML LL (SYRINGE) ×8 IMPLANT
TOWEL GREEN STERILE FF (TOWEL DISPOSABLE) ×8 IMPLANT
TRAY DSU PREP LF (CUSTOM PROCEDURE TRAY) ×4 IMPLANT
UNDERPAD 30X36 HEAVY ABSORB (UNDERPADS AND DIAPERS) ×4 IMPLANT

## 2019-06-26 NOTE — Op Note (Signed)
Operative Note   DATE OF OPERATION: 06/26/2019  SURGICAL DEPARTMENT: Plastic Surgery  PREOPERATIVE DIAGNOSES:  1. Left neck lipoma 2.5cm 2. Left leg wound 8x8cm  POSTOPERATIVE DIAGNOSES:  same  PROCEDURE:  1. Excision left neck lipoma 2.5cm 2. Complex closure left neck 2.5cm 3. Debridement of skin and subcutaneous tissue left leg wound 8x8cm 4. Placement of meshed primatrix AG left leg wound 8x8cm  SURGEON: Talmadge Coventry, MD  ASSISTANT: Verdie Shire, PA  ANESTHESIA:  General.   COMPLICATIONS: None.   INDICATIONS FOR PROCEDURE:  The patient, Ralph Chavez is a 48 y.o. male born on 1971/05/08, is here for treatment of left neck lipoma and left leg wound. MRN: [id number]  CONSENT:  Informed consent was obtained directly from the patient. Risks, benefits and alternatives were fully discussed. Specific risks including but not limited to bleeding, infection, hematoma, seroma, scarring, pain, contracture, asymmetry, wound healing problems, and need for further surgery were all discussed. The patient did have an ample opportunity to have questions answered to satisfaction.   DESCRIPTION OF PROCEDURE:  The patient was taken to the operating room. SCDs were placed and Ancef antibiotics were given. General anesthesia was administered.  The patient's operative site was prepped and draped in a sterile fashion. A time out was performed and all information was confirmed to be correct.  I started with the neck.  Lidocaine with epinephrine was injected.  Incision was made with 15 blade.  Tenotomy scissors used to dissect out lipoma that was deep to platysma.  Hemostasis obtained.  Circumferential undermining performed.  Wound closed in layers with 4-0 Monocryl suture and steri strip.  Attention turned to leg.  Marcaine injected circumferentially.  Wound debrided with 15 blade including skin and subcutaneous tissue, along with colonized granulation tissue.  8x8cm meshed primatrix Ag brought  onto field and rinsed.  This was secured to wound with 4-0 chromic.  Bolster made with xeroform, scrub brush sponges, and 3-0 nylon.  Both areas covered with soft dressing.     The patient tolerated the procedure well.  There were no complications. The patient was allowed to wake from anesthesia, extubated and taken to the recovery room in satisfactory condition.

## 2019-06-26 NOTE — Anesthesia Procedure Notes (Signed)
Procedure Name: LMA Insertion Date/Time: 06/26/2019 10:35 AM Performed by: Imagene Riches, CRNA Pre-anesthesia Checklist: Patient identified, Emergency Drugs available, Suction available and Patient being monitored Patient Re-evaluated:Patient Re-evaluated prior to induction Oxygen Delivery Method: Circle System Utilized Preoxygenation: Pre-oxygenation with 100% oxygen Induction Type: IV induction Ventilation: Mask ventilation without difficulty LMA: LMA inserted LMA Size: 4.0 Number of attempts: 1 Airway Equipment and Method: Bite block Placement Confirmation: positive ETCO2 Tube secured with: Tape Dental Injury: Teeth and Oropharynx as per pre-operative assessment

## 2019-06-26 NOTE — Brief Op Note (Signed)
06/26/2019  11:39 AM  PATIENT:  Ralph Chavez  49 y.o. male  PRE-OPERATIVE DIAGNOSIS:  wound of left lower leg; lipoma of left neck  POST-OPERATIVE DIAGNOSIS:  wound of left lower leg; lipoma of left neck  PROCEDURE:  Procedure(s): EXCISION LIPOMA (Left) IRRIGATION AND DEBRIDEMENT LEFT LEG WOUND (Left) PLACEMENT OF PRIMATRIX LEFT LEG WOUND (Left)  SURGEON:  Surgeon(s) and Role:    * Amitai Delaughter, Steffanie Dunn, MD - Primary  PHYSICIAN ASSISTANT: Software engineer, PA  ASSISTANTS: none   ANESTHESIA:   none  EBL:  10   BLOOD ADMINISTERED:none  DRAINS: none   LOCAL MEDICATIONS USED:  MARCAINE     SPECIMEN:  Source of Specimen:  left neck lipoma  DISPOSITION OF SPECIMEN:  PATHOLOGY  COUNTS:  YES  TOURNIQUET:  * No tourniquets in log *  DICTATION: .Dragon Dictation  PLAN OF CARE: Discharge to home after PACU  PATIENT DISPOSITION:  PACU - hemodynamically stable.   Delay start of Pharmacological VTE agent (>24hrs) due to surgical blood loss or risk of bleeding: not applicable

## 2019-06-26 NOTE — Discharge Instructions (Addendum)
Activity As tolerated:  NO showers, avoid getting incision on leg wet. NO driving. No heavy activities  Diet: Regular  Wound Care: Keep dressing clean & dry, may change gauze as needed. If you change gauze, re-wrap the leg from foot to above wound with white Kerlix gauze roll and ACE wrap. Do not remove yellow bolster or sutures.   Special Instructions: Call Doctor if any unusual problems occur such as pain, excessive bBleeding, unrelieved Nausea/vomiting, Fever &/or chills  Follow-up appointment: Scheduled for next week.      Post Anesthesia Home Care Instructions  Activity: Get plenty of rest for the remainder of the day. A responsible individual must stay with you for 24 hours following the procedure.  For the next 24 hours, DO NOT: -Drive a car -Paediatric nurse -Drink alcoholic beverages -Take any medication unless instructed by your physician -Make any legal decisions or sign important papers.  Meals: Start with liquid foods such as gelatin or soup. Progress to regular foods as tolerated. Avoid greasy, spicy, heavy foods. If nausea and/or vomiting occur, drink only clear liquids until the nausea and/or vomiting subsides. Call your physician if vomiting continues.  Special Instructions/Symptoms: Your throat may feel dry or sore from the anesthesia or the breathing tube placed in your throat during surgery. If this causes discomfort, gargle with warm salt water. The discomfort should disappear within 24 hours.  If you had a scopolamine patch placed behind your ear for the management of post- operative nausea and/or vomiting:  1. The medication in the patch is effective for 72 hours, after which it should be removed.  Wrap patch in a tissue and discard in the trash. Wash hands thoroughly with soap and water. 2. You may remove the patch earlier than 72 hours if you experience unpleasant side effects which may include dry mouth, dizziness or visual disturbances. 3. Avoid  touching the patch. Wash your hands with soap and water after contact with the patch.      Call your surgeon if you experience:   1.  Fever over 101.0. 2.  Inability to urinate. 3.  Nausea and/or vomiting. 4.  Extreme swelling or bruising at the surgical site. 5.  Continued bleeding from the incision. 6.  Increased pain, redness or drainage from the incision. 7.  Problems related to your pain medication. 8.  Any problems and/or concerns

## 2019-06-26 NOTE — Transfer of Care (Signed)
Immediate Anesthesia Transfer of Care Note  Patient: Ralph Chavez  Procedure(s) Performed: EXCISION LIPOMA (Left Neck) IRRIGATION AND DEBRIDEMENT LEFT LEG WOUND (Left Leg Lower) PLACEMENT OF PRIMATRIX LEFT LEG WOUND (Left Leg Lower)  Patient Location: PACU  Anesthesia Type:General  Level of Consciousness: awake, alert  and oriented  Airway & Oxygen Therapy: Patient Spontanous Breathing and Patient connected to nasal cannula oxygen  Post-op Assessment: Report given to RN and Post -op Vital signs reviewed and stable  Post vital signs: Reviewed and stable  Last Vitals:  Vitals Value Taken Time  BP 125/78 06/26/19 1149  Temp    Pulse 98 06/26/19 1158  Resp 18 06/26/19 1158  SpO2 99 % 06/26/19 1158  Vitals shown include unvalidated device data.  Last Pain:  Vitals:   06/26/19 0955  TempSrc: Tympanic  PainSc: 0-No pain      Patients Stated Pain Goal: 3 ([id number])  Complications: No apparent anesthesia complications

## 2019-06-26 NOTE — Anesthesia Preprocedure Evaluation (Addendum)
Anesthesia Evaluation  Patient identified by MRN, date of birth, ID band Patient awake    Reviewed: Allergy & Precautions, NPO status , Patient's Chart, lab work & pertinent test results  Airway Mallampati: I  TM Distance: >3 FB Neck ROM: Full    Dental no notable dental hx. (+) Poor Dentition, Loose, Chipped,    Pulmonary neg pulmonary ROS, Current Smoker and Patient abstained from smoking.,    Pulmonary exam normal breath sounds clear to auscultation       Cardiovascular hypertension, negative cardio ROS Normal cardiovascular exam Rhythm:Regular Rate:Normal     Neuro/Psych PSYCHIATRIC DISORDERS Anxiety Depression Bipolar Disorder negative neurological ROS     GI/Hepatic GERD  Medicated,(+)     substance abuse  cocaine use, marijuana use, methamphetamine use and IV drug use,   Endo/Other  negative endocrine ROS  Renal/GU negative Renal ROS  negative genitourinary   Musculoskeletal negative musculoskeletal ROS (+)   Abdominal   Peds  Hematology negative hematology ROS (+)   Anesthesia Other Findings   Reproductive/Obstetrics                            Anesthesia Physical Anesthesia Plan  ASA: III  Anesthesia Plan: General   Post-op Pain Management:    Induction: Intravenous  PONV Risk Score and Plan: 1 and Ondansetron, Dexamethasone and Midazolam  Airway Management Planned: LMA  Additional Equipment:   Intra-op Plan:   Post-operative Plan: Extubation in OR  Informed Consent: I have reviewed the patients History and Physical, chart, labs and discussed the procedure including the risks, benefits and alternatives for the proposed anesthesia with the patient or authorized representative who has indicated his/her understanding and acceptance.     Dental advisory given  Plan Discussed with: CRNA  Anesthesia Plan Comments:         Anesthesia Quick Evaluation

## 2019-06-26 NOTE — Anesthesia Postprocedure Evaluation (Signed)
Anesthesia Post Note  Patient: Sneyder Spuhler  Procedure(s) Performed: EXCISION LIPOMA (Left Neck) IRRIGATION AND DEBRIDEMENT LEFT LEG WOUND (Left Leg Lower) PLACEMENT OF PRIMATRIX LEFT LEG WOUND (Left Leg Lower)     Patient location during evaluation: PACU Anesthesia Type: General Level of consciousness: awake and alert Pain management: pain level controlled Vital Signs Assessment: post-procedure vital signs reviewed and stable Respiratory status: spontaneous breathing, nonlabored ventilation, respiratory function stable and patient connected to nasal cannula oxygen Cardiovascular status: blood pressure returned to baseline and stable Postop Assessment: no apparent nausea or vomiting Anesthetic complications: no    Last Vitals:  Vitals:   06/26/19 1215 06/26/19 1236  BP: 129/89 131/90  Pulse: 95 89  Resp: 16 16  Temp:  (!) 36.1 C  SpO2: 98% 98%    Last Pain:  Vitals:   06/26/19 1236  TempSrc:   PainSc: 0-No pain                 Rosselyn Martha L Savan Ruta

## 2019-06-26 NOTE — Interval H&P Note (Signed)
History and Physical Interval Note:  06/26/2019 9:46 AM  Ralph Chavez  has presented today for surgery, with the diagnosis of wound of left lower leg; lipoma of left neck.  The various methods of treatment have been discussed with the patient and family. After consideration of risks, benefits and other options for treatment, the patient has consented to  Procedure(s): IRRIGATION AND DEBRIDEMENT EXTREMITY (Left) APPLICATION OF MESHED PRIMATRIX AG (Left) EXCISION LIPOMA (Left) as a surgical intervention.  The patient's history has been reviewed, patient examined, no change in status, stable for surgery.  I have reviewed the patient's chart and labs.  Questions were answered to the patient's satisfaction.     Cindra Presume

## 2019-06-27 ENCOUNTER — Encounter: Payer: Self-pay | Admitting: *Deleted

## 2019-06-27 LAB — SURGICAL PATHOLOGY

## 2019-07-04 ENCOUNTER — Other Ambulatory Visit: Payer: Self-pay

## 2019-07-04 ENCOUNTER — Encounter: Payer: BC Managed Care – PPO | Admitting: Surgical

## 2019-07-04 ENCOUNTER — Encounter: Payer: Self-pay | Admitting: Plastic Surgery

## 2019-07-04 ENCOUNTER — Ambulatory Visit (INDEPENDENT_AMBULATORY_CARE_PROVIDER_SITE_OTHER): Payer: BC Managed Care – PPO | Admitting: Plastic Surgery

## 2019-07-04 VITALS — BP 152/101 | HR 96 | Temp 98.7°F | Ht 69.0 in | Wt 203.0 lb

## 2019-07-04 DIAGNOSIS — S81802D Unspecified open wound, left lower leg, subsequent encounter: Secondary | ICD-10-CM | POA: Diagnosis not present

## 2019-07-04 NOTE — Progress Notes (Signed)
Patient presents postop from an excision of a lipoma in the left neck and a debridement and placement of prime matrix to the left leg wound.  He is overall doing well with no complaints.  His pathology was benign and his neck is healing fine.  His leg is also healing well and the bolster was removed today.  There is very little nonviable tissue and it all looks healthy.  We discussed skin grafting versus continuing wound care and at the moment he is wants to continue doing wound care with Xeroform and a soft wrap.  We will plan to see him again in 2 weeks to discuss this further he knows if he has any problems he can call in the meantime.

## 2019-07-19 ENCOUNTER — Ambulatory Visit (INDEPENDENT_AMBULATORY_CARE_PROVIDER_SITE_OTHER): Payer: BC Managed Care – PPO | Admitting: Surgical

## 2019-07-19 DIAGNOSIS — D170 Benign lipomatous neoplasm of skin and subcutaneous tissue of head, face and neck: Secondary | ICD-10-CM

## 2019-07-19 DIAGNOSIS — S81802D Unspecified open wound, left lower leg, subsequent encounter: Secondary | ICD-10-CM

## 2019-07-19 NOTE — Progress Notes (Signed)
No show

## 2019-08-22 ENCOUNTER — Telehealth: Payer: Self-pay | Admitting: Plastic Surgery

## 2019-08-22 NOTE — Telephone Encounter (Signed)
Patient called to request his op notes for insurance purposes and gave his wife, Yoniel Hinzman, permission to pick them up from our office.
